# Patient Record
Sex: Male | Born: 1945
Health system: Southern US, Community
[De-identification: ages and names within clinical notes are randomized; demographics above are authoritative.]

## PROBLEM LIST (undated history)

## (undated) DIAGNOSIS — T4145XA Adverse effect of unspecified anesthetic, initial encounter: Secondary | ICD-10-CM

## (undated) DIAGNOSIS — T8859XA Other complications of anesthesia, initial encounter: Secondary | ICD-10-CM

## (undated) DIAGNOSIS — N529 Male erectile dysfunction, unspecified: Secondary | ICD-10-CM

## (undated) DIAGNOSIS — E7800 Pure hypercholesterolemia, unspecified: Secondary | ICD-10-CM

## (undated) DIAGNOSIS — Z87442 Personal history of urinary calculi: Secondary | ICD-10-CM

## (undated) DIAGNOSIS — K219 Gastro-esophageal reflux disease without esophagitis: Secondary | ICD-10-CM

## (undated) DIAGNOSIS — J309 Allergic rhinitis, unspecified: Secondary | ICD-10-CM

## (undated) DIAGNOSIS — I4510 Unspecified right bundle-branch block: Secondary | ICD-10-CM

## (undated) DIAGNOSIS — M1990 Unspecified osteoarthritis, unspecified site: Secondary | ICD-10-CM

## (undated) HISTORY — DX: Male erectile dysfunction, unspecified: N52.9

## (undated) HISTORY — DX: Allergic rhinitis, unspecified: J30.9

## (undated) HISTORY — DX: Personal history of urinary calculi: Z87.442

## (undated) HISTORY — DX: Pure hypercholesterolemia, unspecified: E78.00

---
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
## 1998-05-26 HISTORY — PX: KNEE ARTHROSCOPY: SUR90

## 2003-05-27 HISTORY — PX: OTHER SURGICAL HISTORY: SHX169

## 2006-05-26 HISTORY — PX: COLONOSCOPY: SHX174

## 2006-11-02 ENCOUNTER — Encounter: Admission: RE | Admit: 2006-11-02 | Discharge: 2006-11-02 | Payer: Self-pay | Admitting: Family Medicine

## 2009-04-16 ENCOUNTER — Ambulatory Visit: Payer: Self-pay | Admitting: Family Medicine

## 2009-04-16 DIAGNOSIS — J309 Allergic rhinitis, unspecified: Secondary | ICD-10-CM | POA: Insufficient documentation

## 2009-04-16 DIAGNOSIS — E7800 Pure hypercholesterolemia, unspecified: Secondary | ICD-10-CM

## 2009-04-16 DIAGNOSIS — Z87442 Personal history of urinary calculi: Secondary | ICD-10-CM

## 2009-05-30 ENCOUNTER — Ambulatory Visit: Payer: Self-pay | Admitting: Family Medicine

## 2009-05-30 DIAGNOSIS — R5381 Other malaise: Secondary | ICD-10-CM

## 2009-05-30 DIAGNOSIS — R5383 Other fatigue: Secondary | ICD-10-CM

## 2009-06-03 LAB — CONVERTED CEMR LAB
ALT: 26 units/L (ref 0–53)
AST: 34 units/L (ref 0–37)
Albumin: 4.1 g/dL (ref 3.5–5.2)
Alkaline Phosphatase: 69 units/L (ref 39–117)
BUN: 16 mg/dL (ref 6–23)
Basophils Absolute: 0 10*3/uL (ref 0.0–0.1)
Basophils Relative: 0.8 % (ref 0.0–3.0)
Bilirubin, Direct: 0.1 mg/dL (ref 0.0–0.3)
CO2: 27 meq/L (ref 19–32)
Calcium: 9.4 mg/dL (ref 8.4–10.5)
Chloride: 107 meq/L (ref 96–112)
Cholesterol: 178 mg/dL (ref 0–200)
Creatinine, Ser: 1 mg/dL (ref 0.4–1.5)
Eosinophils Absolute: 0.2 10*3/uL (ref 0.0–0.7)
Eosinophils Relative: 4.1 % (ref 0.0–5.0)
GFR calc non Af Amer: 79.78 mL/min (ref >60)
Glucose, Bld: 102 mg/dL — ABNORMAL HIGH (ref 70–99)
HCT: 43.8 % (ref 39.0–52.0)
HDL: 42.5 mg/dL (ref >39.00)
Hemoglobin: 14.4 g/dL (ref 13.0–17.0)
LDL Cholesterol: 110 mg/dL — ABNORMAL HIGH (ref 0–99)
Lymphocytes Relative: 29.1 % (ref 12.0–46.0)
Lymphs Abs: 1.7 10*3/uL (ref 0.7–4.0)
MCHC: 32.9 g/dL (ref 30.0–36.0)
MCV: 92.3 fL (ref 78.0–100.0)
Monocytes Absolute: 0.6 10*3/uL (ref 0.1–1.0)
Monocytes Relative: 10.8 % (ref 3.0–12.0)
Neutro Abs: 3.2 10*3/uL (ref 1.4–7.7)
Neutrophils Relative %: 55.2 % (ref 43.0–77.0)
PSA: 0.48 ng/mL (ref 0.10–4.00)
Platelets: 236 10*3/uL (ref 150.0–400.0)
Potassium: 4 meq/L (ref 3.5–5.1)
RBC: 4.74 M/uL (ref 4.22–5.81)
RDW: 12.5 % (ref 11.5–14.6)
Sodium: 141 meq/L (ref 135–145)
Total Bilirubin: 1.1 mg/dL (ref 0.3–1.2)
Total CHOL/HDL Ratio: 4
Total Protein: 7.5 g/dL (ref 6.0–8.3)
Triglycerides: 129 mg/dL (ref 0.0–149.0)
VLDL: 25.8 mg/dL (ref 0.0–40.0)
WBC: 5.7 10*3/uL (ref 4.5–10.5)

## 2009-06-06 ENCOUNTER — Telehealth: Payer: Self-pay | Admitting: Family Medicine

## 2009-06-06 ENCOUNTER — Ambulatory Visit: Payer: Self-pay | Admitting: Family Medicine

## 2009-07-24 ENCOUNTER — Telehealth: Payer: Self-pay | Admitting: Family Medicine

## 2009-07-24 ENCOUNTER — Ambulatory Visit: Payer: Self-pay | Admitting: Family Medicine

## 2010-02-21 ENCOUNTER — Ambulatory Visit: Payer: Self-pay | Admitting: Family Medicine

## 2010-02-21 DIAGNOSIS — R0609 Other forms of dyspnea: Secondary | ICD-10-CM | POA: Insufficient documentation

## 2010-02-21 DIAGNOSIS — R0989 Other specified symptoms and signs involving the circulatory and respiratory systems: Secondary | ICD-10-CM

## 2010-02-22 LAB — CONVERTED CEMR LAB
Albumin: 4.6 g/dL (ref 3.5–5.2)
BUN: 17 mg/dL (ref 6–23)
Basophils Absolute: 0 10*3/uL (ref 0.0–0.1)
Basophils Relative: 0.5 % (ref 0.0–3.0)
CO2: 28 meq/L (ref 19–32)
Calcium: 9.7 mg/dL (ref 8.4–10.5)
Chloride: 106 meq/L (ref 96–112)
Creatinine, Ser: 1.1 mg/dL (ref 0.4–1.5)
Eosinophils Absolute: 0.6 10*3/uL (ref 0.0–0.7)
Eosinophils Relative: 6.5 % — ABNORMAL HIGH (ref 0.0–5.0)
GFR calc non Af Amer: 71.31 mL/min (ref >60)
Glucose, Bld: 90 mg/dL (ref 70–99)
HCT: 43.9 % (ref 39.0–52.0)
Hemoglobin: 15.3 g/dL (ref 13.0–17.0)
Lymphocytes Relative: 21.7 % (ref 12.0–46.0)
Lymphs Abs: 1.9 10*3/uL (ref 0.7–4.0)
MCHC: 34.8 g/dL (ref 30.0–36.0)
MCV: 91.3 fL (ref 78.0–100.0)
Monocytes Absolute: 0.6 10*3/uL (ref 0.1–1.0)
Monocytes Relative: 6.5 % (ref 3.0–12.0)
Neutro Abs: 5.5 10*3/uL (ref 1.4–7.7)
Neutrophils Relative %: 64.8 % (ref 43.0–77.0)
Phosphorus: 4.3 mg/dL (ref 2.3–4.6)
Platelets: 278 10*3/uL (ref 150.0–400.0)
Potassium: 4.5 meq/L (ref 3.5–5.1)
RBC: 4.8 M/uL (ref 4.22–5.81)
RDW: 13.3 % (ref 11.5–14.6)
Sodium: 141 meq/L (ref 135–145)
TSH: 2.46 microintl units/mL (ref 0.35–5.50)
WBC: 8.5 10*3/uL (ref 4.5–10.5)

## 2010-02-23 HISTORY — PX: OTHER SURGICAL HISTORY: SHX169

## 2010-03-07 ENCOUNTER — Ambulatory Visit: Payer: Self-pay

## 2010-03-07 ENCOUNTER — Encounter: Payer: Self-pay | Admitting: Family Medicine

## 2010-03-13 ENCOUNTER — Encounter (INDEPENDENT_AMBULATORY_CARE_PROVIDER_SITE_OTHER): Payer: Self-pay | Admitting: *Deleted

## 2010-06-05 ENCOUNTER — Telehealth (INDEPENDENT_AMBULATORY_CARE_PROVIDER_SITE_OTHER): Payer: Self-pay | Admitting: *Deleted

## 2010-06-07 ENCOUNTER — Ambulatory Visit
Admission: RE | Admit: 2010-06-07 | Discharge: 2010-06-07 | Payer: Self-pay | Source: Home / Self Care | Attending: Family Medicine | Admitting: Family Medicine

## 2010-06-07 ENCOUNTER — Other Ambulatory Visit: Payer: Self-pay | Admitting: Family Medicine

## 2010-06-07 LAB — CBC WITH DIFFERENTIAL/PLATELET
Basophils Absolute: 0 10*3/uL (ref 0.0–0.1)
Basophils Relative: 0.3 % (ref 0.0–3.0)
Eosinophils Absolute: 0.3 10*3/uL (ref 0.0–0.7)
Eosinophils Relative: 3.5 % (ref 0.0–5.0)
HCT: 42.3 % (ref 39.0–52.0)
Hemoglobin: 14.4 g/dL (ref 13.0–17.0)
Lymphocytes Relative: 30.5 % (ref 12.0–46.0)
Lymphs Abs: 2.3 10*3/uL (ref 0.7–4.0)
MCHC: 34.1 g/dL (ref 30.0–36.0)
MCV: 90.3 fl (ref 78.0–100.0)
Monocytes Absolute: 0.7 10*3/uL (ref 0.1–1.0)
Monocytes Relative: 9.4 % (ref 3.0–12.0)
Neutro Abs: 4.2 10*3/uL (ref 1.4–7.7)
Neutrophils Relative %: 56.3 % (ref 43.0–77.0)
Platelets: 289 10*3/uL (ref 150.0–400.0)
RBC: 4.68 Mil/uL (ref 4.22–5.81)
RDW: 13.2 % (ref 11.5–14.6)
WBC: 7.4 10*3/uL (ref 4.5–10.5)

## 2010-06-07 LAB — HEPATIC FUNCTION PANEL
ALT: 24 U/L (ref 0–53)
AST: 24 U/L (ref 0–37)
Albumin: 4.2 g/dL (ref 3.5–5.2)
Alkaline Phosphatase: 79 U/L (ref 39–117)
Bilirubin, Direct: 0.2 mg/dL (ref 0.0–0.3)
Total Bilirubin: 0.9 mg/dL (ref 0.3–1.2)
Total Protein: 7.1 g/dL (ref 6.0–8.3)

## 2010-06-07 LAB — LIPID PANEL
Cholesterol: 155 mg/dL (ref 0–200)
HDL: 38.7 mg/dL — ABNORMAL LOW (ref >39.00)
LDL Cholesterol: 99 mg/dL (ref 0–99)
Total CHOL/HDL Ratio: 4
Triglycerides: 89 mg/dL (ref 0.0–149.0)
VLDL: 17.8 mg/dL (ref 0.0–40.0)

## 2010-06-07 LAB — PSA: PSA: 0.45 ng/mL (ref 0.10–4.00)

## 2010-06-12 ENCOUNTER — Ambulatory Visit
Admission: RE | Admit: 2010-06-12 | Discharge: 2010-06-12 | Payer: Self-pay | Source: Home / Self Care | Attending: Family Medicine | Admitting: Family Medicine

## 2010-06-17 ENCOUNTER — Telehealth (INDEPENDENT_AMBULATORY_CARE_PROVIDER_SITE_OTHER): Payer: Self-pay | Admitting: *Deleted

## 2010-06-27 NOTE — Progress Notes (Signed)
Summary: Rx. for Cipro  Phone Note Call from Patient Call back at Home Phone [phone]   Caller: Patient Call For: Hannah Beat MD Summary of Call: This patient came in today for a tetanus shot as prophylaxis before his mission trip.  He says that he was advised from the mission board to get a prescription from his MD for Cipro to take with him on his mission trip to [place].  He was told that it might be needed if he had a bout with diarrhea.  They will be taking their own food and cooks but he was told that this is a precautionary measure.  He just needs it before May.........no real hurry. Initial call taken by: Delilah Shan CMA Duncan Dull),  July 24, 2009 8:51 AM  Follow-up for Phone Call        That is reasonable for going to [place]. Follow-up by: Hannah Beat MD,  July 24, 2009 9:09 AM  Additional Follow-up for Phone Call Additional follow up Details #1::        Cipro 500 mg, 2 tabs by mouth x1. 1 refill call in  High dose, 2 tablets x 1 is standard dosing for traveler's diarrhea Additional Follow-up by: Hannah Beat MD,  July 24, 2009 9:12 AM    Additional Follow-up for Phone Call Additional follow up Details #2::    Left message on voicemail  to return call.  Follow-up by: Delilah Shan CMA Duncan Dull),  July 26, 2009 10:41 AM  Additional Follow-up for Phone Call Additional follow up Details #3:: Details for Additional Follow-up Action Taken: Rx called to [facility] pharmacy per patient's request, patient notified as instructed. Additional Follow-up by: Linde Gillis CMA Duncan Dull),  July 26, 2009 11:34 AM  New/Updated Medications: CIPRO 500 MG TABS (CIPROFLOXACIN HCL) take two tablets by mouth times one  Current Allergies (reviewed today): No known allergies

## 2010-06-27 NOTE — Miscellaneous (Signed)
Summary: [facility]  Vaccine Record   Imported By: Lanelle Bal 01/24/2010 14:03:21  _____________________________________________________________________  External Attachment:    Type:   Image     Comment:   External Document

## 2010-06-27 NOTE — Progress Notes (Signed)
Summary: Directions for Flonase  Phone Note From Pharmacy Call back at [phone]   Caller: [facility] Call For: Dr. Dallas Schimke  Summary of Call: Pharmacy has a new rx that was written today for Flonase, they need the directions. Initial call taken by: Sydell Axon LPN,  June 06, 2009 4:44 PM  Follow-up for Phone Call        2 sprays each nostril daily Follow-up by: Hannah Beat MD,  June 06, 2009 4:48 PM

## 2010-06-27 NOTE — Assessment & Plan Note (Signed)
Summary: [name]  Nurse Visit   Allergies: No Known Drug Allergies  Immunizations Administered:  Tetanus Vaccine:    Vaccine Type: Td    Site: right deltoid    Mfr: Sanofi Pasteur    Dose: 0.5 ml    Route: IM    Given by: Delilah Shan CMA (AAMA)    Exp. Date: 04/10/2011    Lot #: [id number]    VIS given: 04/13/07 version given July 24, 2009.  Orders Added: 1)  TD Toxoids IM 7 YR + [90714] 2)  Admin 1st Vaccine [45409]

## 2010-06-27 NOTE — Letter (Signed)
Summary: Records Dated 07-16-84 thru 12-06-08/[facility]  Records Dated 07-16-84 thru 12-06-08/[facility]   Imported By: Lanelle Bal 01/24/2010 14:20:39  _____________________________________________________________________  External Attachment:    Type:   Image     Comment:   External Document

## 2010-06-27 NOTE — Miscellaneous (Signed)
Summary: [facility]  Clinical Lists Changes  Observations: Added new observation of FLU VAX: Historical([facility]) (02/20/2010 14:05)      Immunization History:  Influenza Immunization History:    Influenza:  historical([facility]) (02/20/2010)

## 2010-06-27 NOTE — Assessment & Plan Note (Signed)
Summary: SHORTNESS OF BREATH   Vital Signs:  Patient profile:   65 year old male Weight:      223.75 pounds O2 Sat:      98 % on Room air Temp:     97.9 degrees F oral Pulse rate:   66 / minute Pulse rhythm:   regular BP sitting:   136 / 88  (left arm) Cuff size:   large  Vitals Entered By: Selena Batten Dance CMA Duncan Dull) (February 21, 2010 12:38 PM)  O2 Flow:  Room air CC: Shortness of breath on exhertion   History of Present Illness: CC: DOE  64yo with 4wk h/o DOE, noticing when walking from hole to hole in golf.  Also notices SOB with taking out trash or walking up 1 flight of stairs.  No leg swelling.  Slight orthopnea.  + PNDyspnea x 1 the other night "felt strangled".  Sleeps on 1 pillow.  Has gained 20 lbs in last 6 months.  No CP/tightness, dizziness.  + cough that comes up occasionally (tickle).  No HTN, DM.  Denies EtOH.  Has been walking/jogging 86mi/day at gym in AM, not SOB there.  No new medicines.  Takes alleve about once daily.  No smokers at home.  Has increased gym to two times a day but not noticing weight come down.  No fevers/chills, recent immobility, CA hx, fmhx blood clots.  Current Medications (verified): 1)  Simvastatin 40 Mg Tabs (Simvastatin) .... 1/2 A Tab By Mouth Daily 2)  Osteo Bi-Flex Regular Strength 250-200 Mg Tabs (Glucosamine-Chondroitin) .... One Daily 3)  Aleve 220 Mg Tabs (Naproxen Sodium) .... One Daily 4)  Fish Oil 500 Mg Caps (Omega-3 Fatty Acids) .... 1000mg  Daily 5)  Flonase 50 Mcg/act Susp (Fluticasone Propionate) 6)  Viagra 100 Mg Tabs (Sildenafil Citrate) .Marland Kitchen.. 1 By Mouth Prior To Intercourse  Allergies (verified): No Known Drug Allergies  Past History:  Past Medical History: Last updated: 04/16/2009 NEPHROLITHIASIS, HX OF HYPERCHOLESTEROLEMIA  ED h/o dipping Allergic rhinitis  Social History: Last updated: 04/16/2009 Occupation: Nutritional therapist Married Never Smoked Alcohol use-no Drug use-no Regular exercise-yes (runs, 3 miles,  several times a week)  Review of Systems       per HPI  Physical Exam  General:  Well-developed,well-nourished,in no acute distress; alert,appropriate and cooperative throughout examination Mouth:  Oral mucosa and oropharynx without lesions or exudates.  Teeth in good repair. Neck:  No deformities, masses, or tenderness noted.  no bruits Lungs:  Normal respiratory effort, chest expands symmetrically. Lungs are clear to auscultation, no crackles or wheezes. Heart:  Normal rate and regular rhythm. S1 and S2 normal without gallop, murmur, click, rub or other extra sounds. Abdomen:  Bowel sounds positive,abdomen soft and non-tender without masses, organomegaly or hernias noted.  + ventral hernia Pulses:  2+ rad pulses Extremities:  mild nonpitting edema   Impression & Recommendations:  Problem # 1:  DYSPNEA ON EXERTION (ICD-786.09) concern for cardiac vs pulm.  check CXR today - nothing acute.  discussed with patient.  no risk factors for PE.  Check blood work, set up for ultrasound, rec stay away from salt, f/u with PCP.  No lasix today as no edema on exam (pulm or legs).  BP stable at 130/80s.  Orders: T-2 View CXR (71020TC) TLB-Renal Function Panel (80069-RENAL) TLB-TSH (Thyroid Stimulating Hormone) (84443-TSH)  Complete Medication List: 1)  Simvastatin 40 Mg Tabs (Simvastatin) .... 1/2 a tab by mouth daily 2)  Osteo Bi-flex Regular Strength 250-200 Mg Tabs (Glucosamine-chondroitin) .... One  daily 3)  Aleve 220 Mg Tabs (Naproxen sodium) .... One daily 4)  Fish Oil 500 Mg Caps (Omega-3 fatty acids) .... 1000mg  daily 5)  Flonase 50 Mcg/act Susp (Fluticasone propionate) 6)  Viagra 100 Mg Tabs (Sildenafil citrate) .Marland Kitchen.. 1 by mouth prior to intercourse  Patient Instructions: 1)  This could be coming from heart so we will check blood work today, Xray today, and we will set you up for echo of heart. 2)  Cut back on salt (1500mg  Na/day).  more water.  try to limit alleve, try tylenol for  joint pains. 3)  Good to see you today, call clinic with questions.  Follow up with Dr. Patsy Lager in next few weeks.  Current Allergies (reviewed today): No known allergies   Appended Document: SHORTNESS OF BREATH    Clinical Lists Changes  Orders: Added new Referral order of Radiology Referral (Radiology) - Signed      Appended Document: SHORTNESS OF BREATH    Clinical Lists Changes  Orders: Added new Test order of TLB-CBC Platelet - w/Differential (85025-CBCD) - Signed

## 2010-06-27 NOTE — Progress Notes (Signed)
Summary: viagra   Phone Note Refill Request Message from:  Fax from Pharmacy on June 17, 2010 11:14 AM  Refills Requested: Medication #1:  VIAGRA 100 MG TABS 1 by mouth prior to intercourse.   Last Refilled: 05/16/2010 Refill request from [facility]. [phone].   Initial call taken by: Melody Comas,  June 17, 2010 11:14 AM  Follow-up for Phone Call        i gave 11 refills on last script. please call them and correct. Follow-up by: Hannah Beat MD,  June 17, 2010 12:03 PM  Additional Follow-up for Phone Call Additional follow up Details #1::        pharmacy advised.Consuello Masse CMA   Additional Follow-up by: Benny Lennert CMA Duncan Dull),  June 17, 2010 12:20 PM

## 2010-06-27 NOTE — Assessment & Plan Note (Signed)
Summary: CPX   Vital Signs:  Patient profile:   65 year old male Height:      68 inches Weight:      218.4 pounds BMI:     33.33 Temp:     97.7 degrees F oral Pulse rate:   78 / minute Pulse rhythm:   regular BP sitting:   120 / 78  (left arm) Cuff size:   large  Vitals Entered By: Benny Lennert CMA Duncan Dull) (June 06, 2009 2:41 PM)   History of Present Illness: Chief complaint cpx   Some intermittent ED: the patient is able to achieve  ejaculation from intercourse  the vast majority of time, however his  call the direction is worse than it used to be. Sometimes he does lose his erection in the middle of intercourse.  ttravel medicine: Travelling to [place], needs immunizations and malaria prophylaxis  Had a colnonopscopy in 2008 - 10 years  Preventive Screening-Counseling & Management  Alcohol-Tobacco     Alcohol drinks/day: 0     Alcohol Counseling: not indicated; use of alcohol is not excessive or problematic     Smoking Status: never     Tobacco Counseling: not indicated; no tobacco use  Caffeine-Diet-Exercise     Does Patient Exercise: yes     Type of exercise: cardio weights     Times/week: 4     Exercise Counseling: not indicated; exercise is adequate  Hep-HIV-STD-Contraception     STD Risk Counseling: not indicated-no STD risk noted     Testicular SE Education/Counseling to perform regular STE      Sexual History:  currently monogamous.        Drug Use:  never.    Allergies (verified): No Known Drug Allergies  Past History:  Past medical, surgical, family and social histories (including risk factors) reviewed, and no changes noted (except as noted below).  Past Medical History: Reviewed history from 04/16/2009 and no changes required. NEPHROLITHIASIS, HX OF HYPERCHOLESTEROLEMIA  ED h/o dipping Allergic rhinitis  Past Surgical History: Reviewed history from 04/16/2009 and no changes required. 1954 tonsils and adenoids right knee 2000,  arthroscopy left thumb joint replace 2005, GRAMIG  Family History: Reviewed history from 04/16/2009 and no changes required. Family History Lung cancer Family History of Stroke F 1st degree relative <60 Family History of Stroke M 1st degree relative <50 family history of brain cancer  Social History: Reviewed history from 04/16/2009 and no changes required. Occupation: Nutritional therapist Married Never Smoked Alcohol use-no Drug use-no Regular exercise-yes (runs, 3 miles, several times a week) Sexual History:  currently monogamous Drug Use:  never   Impression & Recommendations:  Problem # 1:  Preventive Health Care (ICD-V70.0) Assessment New The patient's preventative maintenance and recommended screening tests for an annual wellness exam were reviewed in full today.  Counselled on the importance of diet, exercise, and its role in overall health and mortality. The patient's FH and SH was reviewed, including their home life, tobacco status, and drug and alcohol status.   Complete Medication List: 1)  Simvastatin 40 Mg Tabs (Simvastatin) .... 1/2 a tab by mouth daily 2)  Osteo Bi-flex Regular Strength 250-200 Mg Tabs (Glucosamine-chondroitin) .... One daily 3)  Aleve 220 Mg Tabs (Naproxen sodium) .... One daily 4)  Fish Oil 500 Mg Caps (Omega-3 fatty acids) .... 1000mg  daily 5)  Flonase 50 Mcg/act Susp (Fluticasone propionate) 6)  Viagra 100 Mg Tabs (Sildenafil citrate) .Marland Kitchen.. 1 by mouth prior to intercourse 7)  Chloroquine Phosphate 500 Mg  Tabs (Chloroquine phosphate) .Marland Kitchen.. 1 by mouth q week 2 weeks before travel, during, and 4 weeks after Prescriptions: CHLOROQUINE PHOSPHATE 500 MG TABS (CHLOROQUINE PHOSPHATE) 1 by mouth q week 2 weeks before travel, during, and 4 weeks after  #7 x 2   Entered and Authorized by:   Hannah Beat MD   Signed by:   Hannah Beat MD on 06/06/2009   Method used:   Print then Give to Patient   RxID:   [id number] CHLOROQUINE PHOSPHATE 500 MG TABS  (CHLOROQUINE PHOSPHATE) 1 by mouth q week 2 weeks before travel, during, and 4 weeks after  #7 x 0   Entered and Authorized by:   Hannah Beat MD   Signed by:   Hannah Beat MD on 06/06/2009   Method used:   Print then Give to Patient   RxID:   [id number] VIAGRA 100 MG TABS (SILDENAFIL CITRATE) 1 by mouth prior to intercourse  #10 x 11   Entered and Authorized by:   Hannah Beat MD   Signed by:   Hannah Beat MD on 06/06/2009   Method used:   Print then Give to Patient   RxID:   [id number] FLONASE 50 MCG/ACT SUSP (FLUTICASONE PROPIONATE)   #1 x 11   Entered and Authorized by:   Hannah Beat MD   Signed by:   Hannah Beat MD on 06/06/2009   Method used:   Print then Give to Patient   RxID:   [id number] SIMVASTATIN 40 MG TABS (SIMVASTATIN) 1/2 a tab by mouth daily  #30 x 11   Entered and Authorized by:   Hannah Beat MD   Signed by:   Hannah Beat MD on 06/06/2009   Method used:   Print then Give to Patient   RxID:   [id number]   Current Allergies (reviewed today): No known allergies  Colonoscopy Result Date:  05/26/2006 Colonoscopy Result:  normal Colonoscopy Next Due:  10 yr   Review of  Systems General: denies fatigue, malaise, fever, weight loss Eyes: denies blurring, diplopia, irritation, discharge Ear/Nose/Throat: denies ear pain or discharge, nasal obstruction or discharge, sore throat Cardiovascular: denies chest pain, palpitations, paroxysmal nocturnal dyspnea, orthopnea, edema Respiratory: denies coughing, wheezing, dyspnea, hemoptysis Gastrointestinal: denies abdominal pain, dysphagia, nausea, vomiting, diarrhea, constipation Genitourinary: erectile difficulties as noted above Musculoskeletal:the patient does complain of some intermittent knee pain Skin: denies rashes, itching, lumps, sores, lesions, color change Neurologic: denies syncope, seizures, transient paralysis, weakness, paresthesias Psychiatric: denies  depression, anxiety, mental disturbance, difficulty sleeping, suicidal ideation, hallucinations, paranoia Endocrine: denies polyuria, polydipsia, polyphagia, weight change, heat or cold intolerance Heme/Lymphatic: denies easy or excessive bruising, history of blood transfusions, anemia, bleeding disorders, adenopathy, chills, sweats Allergic/Immunologic: denies urticaria, hay fever, frequent UTIs; denies HIV high risk behaviors   Physical Exam General Appearance: well developed, well nourished, no acute distress Eyes: conjunctiva and lids normal, PERRLA, EOMI, fundi WNL Ears, Nose, Mouth, Throat: TM clear, nares clear, oral exam WNL Neck: supple, no lymphadenopathy, no thyromegaly, no JVD Respiratory: clear to auscultation and percussion, respiratory effort normal Cardiovascular: regular rate and rhythm, S1-S2, no murmur, rub or gallop, no bruits, peripheral pulses normal and symmetric, no cyanosis, clubbing, edema or varicosities Chest: no scars, masses, tenderness; no asymmetry, skin changes, nipple discharge, no gynecomastia   Gastrointestinal: soft, non-tender; no hepatosplenomegaly, masses; active bowel sounds all quadrants, guaiac negative stool; no masses, tenderness, hemorrhoids  Genitourinary: no hernia, testicular mass, penile discharge, priapism or prostate enlargement Lymphatic: no cervical, axillary or inguinal adenopathy Musculoskeletal: gait  normal, muscle tone and strength WNL, no joint swelling, effusions, discoloration, crepitus  Skin: clear, good turgor, color WNL, no rashes, lesions, or ulcerations Neurologic: normal mental status, normal reflexes, normal strength, sensation, and motion Psychiatric: alert; oriented to person, place and time Other Exam:        Colorectal Screening   Hemoccult: Not documented    Colonoscopy: normal  (05/26/2006)   Colonoscopy due: 05/26/2016  Other Screening   PSA: 0.48  (05/30/2009)   PSA due due: 05/30/2010   Smoking status:  never  (06/06/2009)  Lipids   Total Cholesterol: 178  (05/30/2009)   LDL: 110  (05/30/2009)   LDL Direct: Not documented   HDL: 42.50  (05/30/2009)   Triglycerides: 129.0  (05/30/2009)    SGOT (AST): 34  (05/30/2009)   SGPT (ALT): 26  (05/30/2009)   Alkaline phosphatase: 69  (05/30/2009)   Total bilirubin: 1.1  (05/30/2009)    Lipid flowsheet reviewed?: Yes   Progress toward LDL goal: At goal

## 2010-06-27 NOTE — Assessment & Plan Note (Signed)
Summary: CPX/CLE   Vital Signs:  Patient profile:   65 year old male Height:      68 inches Weight:      219.50 pounds BMI:     33.50 Temp:     98.1 degrees F oral Pulse rate:   66 / minute Pulse rhythm:   regular BP sitting:   110 / 70  (left arm) Cuff size:   large  Vitals Entered By: Benny Lennert CMA Duncan Dull) (June 12, 2010 8:33 AM)  History of Present Illness: Chief complaint cpx  65 year old male:  colon, 2008  Had some trouble breathing, and he saw Dr. Reece Agar.  at this point, this has mostly gotten better echo normal, grade 1 diastolic disease, normal EF normal CXR Now doing much better, some occ coughing  Had to use some bathroom at night, when lying down could not really get enough  Having to bend up, and bend over, 2-3 miles.  Able to run. No CP or SOB with running.  Also has some occ knee pains  Preventive Screening-Counseling & Management  Alcohol-Tobacco     Alcohol drinks/day: 0     Alcohol Counseling: not indicated; use of alcohol is not excessive or problematic     Smoking Status: never     Tobacco Counseling: not indicated; no tobacco use  Caffeine-Diet-Exercise     Does Patient Exercise: yes     Type of exercise: cardio weights     Times/week: 4     Exercise Counseling: not indicated; exercise is adequate  Hep-HIV-STD-Contraception     STD Risk Counseling: not indicated-no STD risk noted     Testicular SE Education/Counseling to perform regular STE      Sexual History:  currently monogamous.        Drug Use:  never.    Clinical Review Panels:  Prevention   Last Colonoscopy:  normal (05/26/2006)   Last PSA:  0.45 (06/07/2010)  Immunizations   Last Tetanus Booster:  Td (07/24/2009)   Last Flu Vaccine:  Historical([facility]) (02/20/2010)   Last Pneumovax:  Pneumovax (Medicare) (06/12/2010)  Lipid Management   Cholesterol:  155 (06/07/2010)   LDL (bad choesterol):  99 (06/07/2010)   HDL (good cholesterol):  38.70  (06/07/2010)  Diabetes Management   Creatinine:  1.1 (02/21/2010)   Last Flu Vaccine:  Historical([facility]) (02/20/2010)   Last Pneumovax:  Pneumovax (Medicare) (06/12/2010)  CBC   WBC:  7.4 (06/07/2010)   RBC:  4.68 (06/07/2010)   Hgb:  14.4 (06/07/2010)   Hct:  42.3 (06/07/2010)   Platelets:  289.0 (06/07/2010)   MCV  90.3 (06/07/2010)   MCHC  34.1 (06/07/2010)   RDW  13.2 (06/07/2010)   PMN:  56.3 (06/07/2010)   Lymphs:  30.5 (06/07/2010)   Monos:  9.4 (06/07/2010)   Eosinophils:  3.5 (06/07/2010)   Basophil:  0.3 (06/07/2010)  Complete Metabolic Panel   Glucose:  90 (02/21/2010)   Sodium:  141 (02/21/2010)   Potassium:  4.5 (02/21/2010)   Chloride:  106 (02/21/2010)   CO2:  28 (02/21/2010)   BUN:  17 (02/21/2010)   Creatinine:  1.1 (02/21/2010)   Albumin:  4.2 (06/07/2010)   Total Protein:  7.1 (06/07/2010)   Calcium:  9.7 (02/21/2010)   Total Bili:  0.9 (06/07/2010)   Alk Phos:  79 (06/07/2010)   SGPT (ALT):  24 (06/07/2010)   SGOT (AST):  24 (06/07/2010)   Allergies (verified): No Known Drug Allergies  Past History:  Past medical, surgical, family and  social histories (including risk factors) reviewed, and no changes noted (except as noted below).  Past Medical History: Reviewed history from 04/16/2009 and no changes required. NEPHROLITHIASIS, HX OF HYPERCHOLESTEROLEMIA  ED h/o dipping Allergic rhinitis  Past Surgical History: Reviewed history from 03/11/2010 and no changes required. 1954 tonsils and adenoids right knee 2000, arthroscopy left thumb joint replace 2005, GRAMIG  2D echo - mild diastolic dysfunction, nl EF 02/2010   Family History: Reviewed history from 04/16/2009 and no changes required. Family History Lung cancer Family History of Stroke F 1st degree relative <60 Family History of Stroke M 1st degree relative <50 family history of brain cancer  Social History: Reviewed history from 04/16/2009 and no changes  required. Occupation: Nutritional therapist Married Never Smoked Alcohol use-no Drug use-no Regular exercise-yes (runs, 3 miles, several times a week)  Review of Systems  General: Denies fever, chills, sweats, anorexia, fatigue, weakness, malaise Eyes: Denies blurring, vision loss ENT: Denies earache, nasal congestion, nosebleeds, sore throat, and hoarseness.  Cardiovascular / Resp: as above GI: Denies nausea, vomiting, diarrhea, constipation, change in bowel habits, abdominal pain, melena, hematochezia GU: Denies dysuria, hematuria, discharge, urinary frequency, urinary hesitancy, nocturia, incontinence, genital sores, decreased libido Musculoskeletal: as above Derm: Denies rash, itching Neuro: Denies  paresthesias, frequent falls, frequent headaches, and difficulty walking.  Psych: Denies depression, anxiety Endocrine: Denies cold intolerance, heat intolerance, polydipsia, polyphagia, polyuria, and unusual weight change.  Heme: Denies enlarged lymph nodes Allergy: No hayfever   Otherwise, the pertinent positives and negatives are listed above and in the HPI, otherwise a full review of systems has been reviewed and is negative unless noted positive.    Impression & Recommendations:  Problem # 1:  HEALTH MAINTENANCE EXAM (ICD-V70.0) The patient's preventative maintenance and recommended screening tests for an annual wellness exam were reviewed in full today. Brought up to date unless services declined.  Counselled on the importance of diet, exercise, and its role in overall health and mortality. The patient's FH and SH was reviewed, including their home life, tobacco status, and drug and alcohol status.   Complete Medication List: 1)  Simvastatin 40 Mg Tabs (Simvastatin) .... 1/2 a tab by mouth daily 2)  Osteo Bi-flex Regular Strength 250-200 Mg Tabs (Glucosamine-chondroitin) .... One daily 3)  Aleve 220 Mg Tabs (Naproxen sodium) .... One daily 4)  Fish Oil 500 Mg Caps (Omega-3 fatty acids)  .... 1000mg  daily 5)  Fluticasone Propionate 50 Mcg/act Susp (Fluticasone propionate) .... 2 sprays each nostril once daily 6)  Viagra 100 Mg Tabs (Sildenafil citrate) .Marland Kitchen.. 1 by mouth prior to intercourse  Other Orders: Pneumococcal Vaccine (96045) Admin 1st Vaccine (40981)  Patient Instructions: 1)  GENERIC OMEPRAZOLE EVERY MORNING, 30 MINUTES BEFORE BREAKFAST  Prescriptions: FLUTICASONE PROPIONATE 50 MCG/ACT  SUSP (FLUTICASONE PROPIONATE) 2 sprays each nostril once daily  #1 vial x 11   Entered and Authorized by:   Hannah Beat MD   Signed by:   Hannah Beat MD on 06/12/2010   Method used:   Electronically to        [facility]* (retail)       [address]       [place], [place]  [address]       Ph: [phone]       Fax: [phone]   RxID:   [id number] VIAGRA 100 MG TABS (SILDENAFIL CITRATE) 1 by mouth prior to intercourse  #10 x 11   Entered and Authorized by:   Hannah Beat MD   Signed by:  Hannah Beat MD on 06/12/2010   Method used:   Electronically to        [facility]* (retail)       [address]       [place], [place]  [address]       Ph: [phone]       Fax: [phone]   RxID:   [id number] SIMVASTATIN 40 MG TABS (SIMVASTATIN) 1/2 a tab by mouth daily  #30 x 11   Entered and Authorized by:   Hannah Beat MD   Signed by:   Hannah Beat MD on 06/12/2010   Method used:   Electronically to        [facility]* (retail)       [address]       [place], [place]  [address]       Ph: [phone]       Fax: [phone]   RxID:   [id number]    Orders Added: 1)  Pneumococcal Vaccine [90732] 2)  Admin 1st Vaccine [90471] 3)  Est. Patient 65& > [01093]   Immunizations Administered:  Pneumonia Vaccine:    Vaccine Type: Pneumovax (Medicare)    Site: left deltoid    Mfr: Merck    Dose: 0.5 ml    Route: IM    Given by: Benny Lennert CMA (AAMA)    Exp. Date: 10/18/2011    Lot #: [id number]    VIS given: 04/30/09  version given June 12, 2010.   Immunizations Administered:  Pneumonia Vaccine:    Vaccine Type: Pneumovax (Medicare)    Site: left deltoid    Mfr: Merck    Dose: 0.5 ml    Route: IM    Given by: Benny Lennert CMA (AAMA)    Exp. Date: 10/18/2011    Lot #: [id number]    VIS given: 04/30/09 version given June 12, 2010.  Prevention & Chronic Care Immunizations   Influenza vaccine: Historical([facility])  (02/20/2010)    Tetanus booster: 07/24/2009: Td    Pneumococcal vaccine: Pneumovax (Medicare)  (06/12/2010)    H. zoster vaccine: Not documented   H. zoster vaccine deferral: Deferred  (06/12/2010)  Colorectal Screening   Hemoccult: Not documented    Colonoscopy: normal  (05/26/2006)   Colonoscopy due: 05/26/2016  Other Screening   PSA: 0.45  (06/07/2010)   PSA due due: 05/30/2010   Smoking status: never  (06/12/2010)  Lipids   Total Cholesterol: 155  (06/07/2010)   LDL: 99  (06/07/2010)   LDL Direct: Not documented   HDL: 38.70  (06/07/2010)   Triglycerides: 89.0  (06/07/2010)    SGOT (AST): 24  (06/07/2010)   SGPT (ALT): 24  (06/07/2010)   Alkaline phosphatase: 79  (06/07/2010)   Total bilirubin: 0.9  (06/07/2010)  Self-Management Support :    Lipid self-management support: Not documented    Nursing Instructions: Give Pneumovax today    Current Allergies (reviewed today): No known allergies  Physical Exam General Appearance: well developed, well nourished, no acute distress Eyes: conjunctiva and lids normal, PERRLA, EOMI Ears, Nose, Mouth, Throat: TM clear, nares clear, oral exam WNL Neck: supple, no lymphadenopathy, no thyromegaly, no JVD Respiratory: clear to auscultation and percussion, respiratory effort normal Cardiovascular: regular rate and rhythm, S1-S2, no murmur, rub or gallop, no bruits, peripheral pulses normal and symmetric, no cyanosis, clubbing, edema or varicosities Chest: no scars, masses, tenderness; no asymmetry, skin changes,  nipple discharge, no gynecomastia   Gastrointestinal: soft, non-tender; no hepatosplenomegaly, masses; active bowel sounds all quadrants, no masses,  tenderness, hemorrhoids  Genitourinary: no hernia, testicular mass, penile discharge, priapism or prostate enlargement Lymphatic: no cervical, axillary or inguinal adenopathy Musculoskeletal: gait normal, muscle tone and strength WNL, no joint swelling, effusions, discoloration, crepitus  Skin: clear, good turgor, color WNL, no rashes, lesions, or ulcerations Neurologic: normal mental status, normal reflexes, normal strength, sensation, and motion Psychiatric: alert; oriented to person, place and time Other Exam:

## 2010-06-27 NOTE — Progress Notes (Signed)
----   Converted from flag ---- ---- 06/03/2010 12:39 PM, Hannah Beat MD wrote: Prephysical Labs, several days before, fasting HFP, FLP, CBC with diff, PSA: 272.4, v77.1, ,780.79, v76.44   ---- 06/03/2010 11:46 AM, Liane Comber CMA (AAMA) wrote: Lab orders please! Good Morning! This pt is scheduled for cpx labs Friday, which labs to draw and dx codes to use? Thanks Tasha ------------------------------

## 2010-12-25 HISTORY — PX: TOTAL KNEE ARTHROPLASTY: SHX125

## 2011-01-10 ENCOUNTER — Other Ambulatory Visit: Payer: Self-pay | Admitting: Orthopedic Surgery

## 2011-01-10 ENCOUNTER — Encounter (HOSPITAL_COMMUNITY): Payer: 59

## 2011-01-10 LAB — URINALYSIS, ROUTINE W REFLEX MICROSCOPIC
Specific Gravity, Urine: 1.016 (ref 1.005–1.030)
Urobilinogen, UA: 0.2 mg/dL (ref 0.0–1.0)
pH: 6.5 (ref 5.0–8.0)

## 2011-01-10 LAB — CBC
HCT: 43 % (ref 39.0–52.0)
Hemoglobin: 14.6 g/dL (ref 13.0–17.0)
RBC: 4.88 MIL/uL (ref 4.22–5.81)
RDW: 12.6 % (ref 11.5–15.5)
WBC: 6.9 10*3/uL (ref 4.0–10.5)

## 2011-01-10 LAB — SURGICAL PCR SCREEN
MRSA, PCR: NEGATIVE
Staphylococcus aureus: NEGATIVE

## 2011-01-10 LAB — COMPREHENSIVE METABOLIC PANEL
BUN: 17 mg/dL (ref 6–23)
CO2: 26 mEq/L (ref 19–32)
Chloride: 104 mEq/L (ref 96–112)
Creatinine, Ser: 0.89 mg/dL (ref 0.50–1.35)
GFR calc non Af Amer: >60 mL/min (ref >60)
Total Bilirubin: 0.4 mg/dL (ref 0.3–1.2)

## 2011-01-10 LAB — URINE MICROSCOPIC-ADD ON

## 2011-01-10 LAB — PROTIME-INR: INR: 0.96 (ref 0.00–1.49)

## 2011-01-20 ENCOUNTER — Inpatient Hospital Stay (HOSPITAL_COMMUNITY)
Admission: RE | Admit: 2011-01-20 | Discharge: 2011-01-23 | DRG: 470 | Disposition: A | Discharge status: Home Health | Payer: 59 | Source: Ambulatory Visit | Attending: Orthopedic Surgery | Admitting: Orthopedic Surgery

## 2011-01-20 DIAGNOSIS — E7800 Pure hypercholesterolemia, unspecified: Secondary | ICD-10-CM | POA: Diagnosis present

## 2011-01-20 DIAGNOSIS — M1710 Unilateral primary osteoarthritis, unspecified knee: Principal | ICD-10-CM | POA: Diagnosis present

## 2011-01-20 DIAGNOSIS — Z79899 Other long term (current) drug therapy: Secondary | ICD-10-CM

## 2011-01-20 DIAGNOSIS — Z791 Long term (current) use of non-steroidal anti-inflammatories (NSAID): Secondary | ICD-10-CM

## 2011-01-20 DIAGNOSIS — D62 Acute posthemorrhagic anemia: Secondary | ICD-10-CM | POA: Diagnosis not present

## 2011-01-20 DIAGNOSIS — Z01812 Encounter for preprocedural laboratory examination: Secondary | ICD-10-CM

## 2011-01-21 LAB — CBC
HCT: 36.1 % — ABNORMAL LOW (ref 39.0–52.0)
MCHC: 34.1 g/dL (ref 30.0–36.0)
MCV: 89.1 fL (ref 78.0–100.0)
RDW: 13 % (ref 11.5–15.5)

## 2011-01-21 LAB — BASIC METABOLIC PANEL
BUN: 13 mg/dL (ref 6–23)
Chloride: 100 mEq/L (ref 96–112)
Creatinine, Ser: 0.96 mg/dL (ref 0.50–1.35)
GFR calc Af Amer: >60 mL/min (ref >60)
GFR calc non Af Amer: >60 mL/min (ref >60)

## 2011-01-22 LAB — BASIC METABOLIC PANEL
BUN: 10 mg/dL (ref 6–23)
Calcium: 8.7 mg/dL (ref 8.4–10.5)
GFR calc Af Amer: >60 mL/min (ref >60)
GFR calc non Af Amer: >60 mL/min (ref >60)
Potassium: 3.9 mEq/L (ref 3.5–5.1)

## 2011-01-22 LAB — CBC
HCT: 32.5 % — ABNORMAL LOW (ref 39.0–52.0)
MCHC: 33.5 g/dL (ref 30.0–36.0)
RDW: 12.8 % (ref 11.5–15.5)

## 2011-01-23 LAB — CBC
HCT: 29 % — ABNORMAL LOW (ref 39.0–52.0)
MCH: 30.4 pg (ref 26.0–34.0)
MCV: 89 fL (ref 78.0–100.0)
RBC: 3.26 MIL/uL — ABNORMAL LOW (ref 4.22–5.81)

## 2011-01-23 NOTE — Op Note (Signed)
NAME:  [name], [name] NO.:  [id number]  MEDICAL RECORD NO.:  [id number]  LOCATION:  1618                         FACILITY:  [facility]  PHYSICIAN:  Ollen Gross, M.D.    DATE OF BIRTH:  16-Apr-1946  DATE OF PROCEDURE:  01/20/2011 DATE OF DISCHARGE:                              OPERATIVE REPORT   PREOPERATIVE DIAGNOSIS:  Osteoarthritis, right knee.  POSTOPERATIVE DIAGNOSIS:  Osteoarthritis, right knee.  PROCEDURE:  Right total knee arthroplasty.  SURGEON:  Ollen Gross, MD  ASSISTANT:  Alexzandrew L. Julien Girt, PA-C  ANESTHESIA:  General.  ESTIMATED BLOOD LOSS:  Minimal.  DRAIN:  Hemovac x1.  TOURNIQUET TIME:  43 minutes at 300 mmHg.  COMPLICATIONS:  None.  CONDITION:  Stable to Recovery.  BRIEF CLINICAL NOTE:  [name] is a 65 year old male with advanced end-stage arthritis of the right knee, bone on bone in the medial and patellofemoral compartments.  These had extensive nonoperative management, including injections and has had persistently worsening pain and dysfunction.  He has no contraindications to knee replacement.  He presents now for right total knee arthroplasty.  PROCEDURE IN DETAIL:  After successful administration of general anesthetic, a tourniquet was placed high on his right thigh, and his right lower extremity was prepped and draped in usual sterile fashion. Extremity was wrapped in an Esmarch, knee flexed, tourniquet inflated to 300 mmHg.  Midline incision was made with a 10 blade through subcutaneous tissue to the level of the extensor mechanism.  A fresh blade was to make a medial parapatellar arthrotomy.  Soft tissue over the proximal medial tibia was subperiosteally elevated to the joint line with the knife into the semimembranosus bursa with a Cobb elevator. Soft tissue laterally was elevated with attention being paid to avoid any patellar tendon on tibial tubercle.  The patella was everted, knee flexed 90 degrees, and ACL  and PCL removed.  Drill was used to create a starting hole in the distal femur and the canal was thoroughly irrigated.  A 5-degree right valgus alignment guide was placed.  Distal femoral cutting block was pinned to remove 11 mm off the distal femur. Distal femoral resection was made with an oscillating saw.  The tibia subluxed forward and the menisci were removed.  Extramedullary tibial alignment guide was placed referencing proximally at the medial aspect of the tibial tubercle and distally along the 2nd metatarsal axis and tibial crest.  The block was pinned to remove 2 mm off the more deficient medial side.  Tibial resection was made with an oscillating saw.  Size 4 was most the appropriate tibial component and the proximal tibia was prepared with a modular drill and keel punched for the size 4.  Femoral sizing guide was placed, size 5 was most appropriate on the femur.  Rotation was marked off the epicondylar axis and confirmed by creating rectangular flexion gap at 90 degrees.  Cutting block was pinned in this rotation and the anterior and posterior chamfer cuts were made.  Intercondylar block was placed and that cut was made.  Trial size 5 posterior stabilized femur was placed.  A 12.5 mm posterior stabilized rotating platform insert trial was placed.  With the 12.5, full extension was  achieved with excellent varus-valgus and anterior- posterior balance, throughout full range of motion.  Patella was everted and thickness measured to be 26 mm.  Freehand resection was taken to 14 mm, 41 template was placed, lug holes were drilled, trial patella was placed, and it tracked normally.  Osteophytes removed from the posterior femur with trial in place.  All trials were removed and the cut bone surfaces were prepared with pulsatile lavage.  Cement was mixed and once ready for implantation, a size 4 mobile-bearing tibial tray, size 5 posterior stabilized femur, and 41 patella were cemented  into place and the patella was held with a clamp.  Trial 12.5 mm insert was placed, knee held in full extension, and all extruded cement removed.  When the cement was fully hardened, then the permanent 12.5 mm posterior stabilized rotating platform insert was placed in tibial tray.  The wound was copiously irrigated with saline solution and the arthrotomy closed over Hemovac drain with interrupted #1 PDS.  Flexion against gravity was 135 degrees and the patella tracked normally.  Tourniquet was released for a total time of 43 minutes.  Subcutaneous was closed with interrupted 2-0 Vicryl and subcuticular with running 4-0 Monocryl. Catheter for Marcaine pain pump was placed and the pump was initiated. Incision cleaned and dried and Steri-Strips and a bulky sterile dressing applied.  He was then placed into a knee immobilizer, awakened, and transported to Recovery in stable condition.     Ollen Gross, M.D.     FA/MEDQ  D:  01/20/2011  T:  01/21/2011  Job:  [id number]  Electronically Signed by Ollen Gross M.D. on 01/23/2011 04:06:29 PM

## 2011-01-23 NOTE — H&P (Signed)
NAME:  [name], TILLERY NO.:  [id number]  MEDICAL RECORD NO.:  [id number]  LOCATION:                                 FACILITY:  PHYSICIAN:  Ollen Gross, M.D.    DATE OF BIRTH:  1946/05/04  DATE OF ADMISSION:  01/20/2011 DATE OF DISCHARGE:                             HISTORY & PHYSICAL   CHIEF COMPLAINT:  Right knee pain.  HISTORY OF PRESENT ILLNESS:  The patient is a 65 year old male who has been seen by Dr. Lequita Halt for ongoing knee pain.  His right knee has progressively gotten worse over time.  He is starting to have some functional limitations due to the progressive nature.  He has been seen in the office where he was found to have advanced arthritis with bone on bone and medial patellofemoral compartments, started to impact his daily activities and also interfering with his recreational activities.  It is felt he would benefit from undergoing surgical intervention.  Risks and benefits have been discussed.  There are no active contraindications ongoing such as infection or rapidly progressive neurological disease.  ALLERGIES:  No known drug allergies.  CURRENT MEDICATIONS:  Cholesterol tablet, Aleve, and Osteo Bi-Flex.  PAST MEDICAL HISTORY:  Hypercholesterolemia and osteoarthritis.  PAST SURGICAL HISTORY:  Carpal tunnel surgery in the left, thumb surgery on the left, arthroscopic knee surgery on the right.  FAMILY HISTORY:  Father with lung and brain cancer.  Mother with lung cancer.  SOCIAL HISTORY:  Married, retired, nonsmoker.  He does have a caregiver lined up.  He has 6-7 steps entering his home.  He does plan on going home after surgery.  He does have healthcare power of attorney.  REVIEW OF SYSTEMS:  GENERAL:  No fever, chills, or night sweats.  NEURO: Bi seizure, syncope, or paralysis.  RESPIRATORY:  No shortness of breath, productive cough, or hemoptysis.  CARDIOVASCULAR:  No chest pain, angina, or orthopnea.  GI:  No nausea, vomiting,  diarrhea, or constipation.  GU:  No dysuria, hematuria, or discharge. MUSCULOSKELETAL:  Knee pain.  PHYSICAL EXAMINATION:  VITAL SIGNS:  Pulse 72, respirations 12, blood pressure 129/80. GENERAL:  A 65 year old white male, well nourished, well developed, in no acute distress, slightly overweight.  Accompanied by his wife. Alert, oriented, and cooperative. HEENT:  Normocephalic, atraumatic.  Pupils are round and reactive.  EOMs intact. NECK:  Supple. CHEST:  Clear. HEART:  Regular rate and rhythm without murmur.  S1, S2 noted. ABDOMEN:  Soft, slightly round.  Bowel sounds present. RECTAL/BREASTS/GENITALIA:  Not done, not pertinent to present illness. EXTREMITIES:  Right knee, varus malalignment deformity, range of motion 10-120, marked crepitus, tender more medial than lateral, no instability.  IMPRESSION:  Osteoarthritis, right knee.  PLAN:  The patient admitted to [facility] to undergo a right total knee replacement arthroplasty.  Surgery will be performed by Dr. Ollen Gross.     Alexzandrew L. Julien Girt, P.A.C.   ______________________________ Ollen Gross, M.D.    ALP/MEDQ  D:  01/19/2011  T:  01/20/2011  Job:  [id number]  cc:   Dr. Dallas Schimke  Electronically Signed by Patrica Duel P.A.C. on 01/23/2011 10:30:48 AM Electronically Signed by Ollen Gross M.D. on 01/23/2011 04:06:32  PM

## 2011-02-05 NOTE — Discharge Summary (Signed)
NAMEMarland Pena  JONCARLO, FRIBERG NO.:  [id number]  MEDICAL RECORD NO.:  [id number]  LOCATION:  1618                         FACILITY:  [facility]  PHYSICIAN:  Ollen Gross, M.D.    DATE OF BIRTH:  March 30, 1946  DATE OF ADMISSION:  01/20/2011 DATE OF DISCHARGE:  01/23/2011                              DISCHARGE SUMMARY   ADMITTING DIAGNOSES: 1. Osteoarthritis, right knee. 2. Hypercholesterolemia.  DISCHARGE DIAGNOSES: 1. Osteoarthritis, right knee, status post right total knee     replacement arthroplasty. 2. Mild postop acute blood loss anemia, did not require transfusion. 3. Hypercholesterolemia.  PROCEDURE:  January 20, 2011, right total knee.  Surgeon:  Dr. Homero Fellers Aarib Pulido.  Assistant:  Patrica Duel, PA-C.  Anesthesia:  General. Tourniquet time:  43 minutes.  CONSULTS:  None.  BRIEF HISTORY:  The patient is a 65 year old male with advanced end- stage arthritis of the right knee with bone-on-bone medial and patellofemoral compartments who had extensive nonoperative management including injections.  He had persistent worsening pain and dysfunction, no contraindications to replacement, and now presents for total knee.  LABORATORY DATA:  Preop CBC showed a hemoglobin of 14.6, hematocrit of 43.0, white cell count of 6.9, platelets 273.  PT and INR 13.0 and 0.96 with a PTT of 34.  Chem panel on admission all within normal limits. Preop UA:  Small blood, few bacteria.  Blood group type O negative. Nasal swabs were negative for Staph aureus, negative for MRSA.  Serial CBCs were followed.  Hemoglobin dropped down to 12.3 and 10.9; last done H and H prior to discharge was 9.9 and 29.0.  Serial BMETs were followed for 48 hours.  Electrolytes all remained within normal limits.  HOSPITAL COURSE:  The patient was admitted to [facility], taken to OR, and underwent above-stated procedure without complication. The patient tolerated the procedure well, later was  transferred to the recovery room and orthopedic floor, started on p.o. and IV analgesic for pain control following the surgery.  He had a PCA and had that discontinued on day 1.  He was actually doing fairly well with pain control on the morning of day 1, had good urine output.  I put him on Xarelto for DVT prophylaxis.  He got up and ambulated over 50 feet on the morning of day 1.  By day 2, he was doing fairly well.  Dressing was changed, incision looked good.  Hemoglobin was stable at 10.9.  He was tolerating his medications and by day 3, he was progressing, meeting his goals with therapy and discharged home.  DISCHARGE PLAN: 1. The patient discharged home on January 23, 2011. 2. Discharge diagnoses:  Please see above. 3. Discharge medications:  Xarelto, Robaxin, and oxycodone.  Continue     home medications of fluticasone nasal spray and simvastatin. 4. Diet:  As tolerated. 5. Activity:  Weightbearing as tolerated, total knee protocol. 6. Followup:  In 2 weeks.  DISPOSITION:  Home.  CONDITION ON DISCHARGE:  Improved.  Dictated For:  Gus Rankin. Ilyssa Grennan, MD     Alexzandrew L. Julien Girt, P.A.C.   ______________________________ Ollen Gross, M.D.    ALP/MEDQ  D:  01/23/2011  T:  01/23/2011  Job:  [id number]  cc:   Dr. Patsy Lager  Electronically Signed by Ollen Gross M.D. on 02/05/2011 04:10:37 PM

## 2011-02-11 ENCOUNTER — Encounter: Payer: Self-pay | Admitting: Orthopedic Surgery

## 2011-02-24 ENCOUNTER — Encounter: Payer: Self-pay | Admitting: Orthopedic Surgery

## 2011-02-26 ENCOUNTER — Telehealth: Payer: Self-pay | Admitting: *Deleted

## 2011-02-26 NOTE — Telephone Encounter (Signed)
Pt would like to change form Dr. Patsy Lager to Dr. Sharen Hones, he didn't give a reason.

## 2011-02-26 NOTE — Telephone Encounter (Signed)
I notified patient.  Patient scheduled a CPX on 06/15/10 with Dr.Gutierrez.

## 2011-02-26 NOTE — Telephone Encounter (Signed)
i think i've seen him once before.  Ok by me if ok by Dr. Salena Saner.

## 2011-02-26 NOTE — Telephone Encounter (Signed)
Fine with me. I do not know well, but would not anticipate a prob.

## 2011-03-22 ENCOUNTER — Emergency Department: Payer: Self-pay | Admitting: Unknown Physician Specialty

## 2011-03-24 ENCOUNTER — Ambulatory Visit (INDEPENDENT_AMBULATORY_CARE_PROVIDER_SITE_OTHER): Payer: 59 | Admitting: Family Medicine

## 2011-03-24 ENCOUNTER — Telehealth: Payer: Self-pay | Admitting: Family Medicine

## 2011-03-24 ENCOUNTER — Encounter: Payer: Self-pay | Admitting: Family Medicine

## 2011-03-24 VITALS — BP 126/78 | HR 76 | Temp 97.7°F | Wt 220.4 lb

## 2011-03-24 DIAGNOSIS — M545 Low back pain: Secondary | ICD-10-CM

## 2011-03-24 DIAGNOSIS — Z87442 Personal history of urinary calculi: Secondary | ICD-10-CM

## 2011-03-24 DIAGNOSIS — N200 Calculus of kidney: Secondary | ICD-10-CM | POA: Insufficient documentation

## 2011-03-24 DIAGNOSIS — K5900 Constipation, unspecified: Secondary | ICD-10-CM | POA: Insufficient documentation

## 2011-03-24 LAB — POCT URINALYSIS DIPSTICK
Bilirubin, UA: NEGATIVE
Glucose, UA: NEGATIVE
Spec Grav, UA: 1.025
pH, UA: 6

## 2011-03-24 MED ORDER — TAMSULOSIN HCL 0.4 MG PO CAPS: 0.4000 mg | ORAL_CAPSULE | Freq: Every day | ORAL | Status: DC

## 2011-03-24 NOTE — Progress Notes (Signed)
Subjective:    Patient ID: [name], male    DOB: [birth year], 65 y.o.   MRN: [id number]  HPI CC: kidney stone.  Friday night started with several episodes of diarrhea at 3am.  This progressed to nausea and dry heaving, endorsing chills as well.  Also associated with sharp pains right lower quadrant of abdomen.  Pain got severe so went to ER.  No records available currently.  Blood tests, urine looked normal.  Then had CT scan - right sided kidney stone in ureter per pt.  Treated with morphine, went to bathroom and had some hematuria, pain improved.  Sent home with ibuprofen and percocets Saturday.  Sunday pain started again, this time different place - right lower back (sharp, stabbing).    Came into clinic as walkin this morning requesting to be set up with urology, in pain.  Placed on my schedule for this afternoon and appt made tomorrow with urology Dr. Garner Nash.  Has CT scan CD to take with him.  Currently pain improved after percocets kicked in.  H/o kidney stone 30 years ago.  No problems since.  Also no BM in 3 days (since diarrhea).  Records arrived => CT w/ contrast with 3mm stone distal R ureter above UVJ, mild R hydronephrosis.  Cr 1.29.  CMP WNL.  CBC WNL.  EKG NSR 62.    H/o gout, has had 2 flares in last few months.  Medications and allergies reviewed and updated in chart.  Past histories reviewed and updated if relevant as below. Patient Active Problem List  Diagnoses  . HYPERCHOLESTEROLEMIA  . ALLERGIC RHINITIS  . OTHER MALAISE AND FATIGUE  . DYSPNEA ON EXERTION  . NEPHROLITHIASIS, HX OF   Past Medical History  Diagnosis Date  . History of nephrolithiasis   . Hypercholesterolemia   . ED (erectile dysfunction)   . AR (allergic rhinitis)    Past Surgical History  Procedure Date  . Tonsillectomy and adenoidectomy 1954  . Rt knee arthroscopy 2000  . L thumb joint replace 2005    GRAMIG   . 2d echo 10/11    mild diastolic dysfunction, nl EF   . Total  knee arthroplasty 12/2010    right    History  Substance Use Topics  . Smoking status: Never Smoker   . Smokeless tobacco: Not on file  . Alcohol Use: No   Family History  Problem Relation Age of Onset  . Lung cancer      family hx  . Stroke      F 1st degree relative <60  . Stroke      M 1st degree relative <50  . Brain cancer      family hx    No Known Allergies Current Outpatient Prescriptions on File Prior to Visit  Medication Sig Dispense Refill  . fluticasone (FLONASE) 50 MCG/ACT nasal spray Place 2 sprays into the nose daily.        . sildenafil (VIAGRA) 100 MG tablet Take 100 mg by mouth as needed. Prior to intercourse.       . simvastatin (ZOCOR) 40 MG tablet Take 20 mg by mouth daily.         Review of Systems per HPI    Objective:   Physical Exam  Nursing note and vitals reviewed. Constitutional: He appears well-developed and well-nourished. No distress.  HENT:  Head: Normocephalic and atraumatic.  Mouth/Throat: Oropharynx is clear and moist. No oropharyngeal exudate.  Eyes: Conjunctivae and EOM are normal. Pupils  are equal, round, and reactive to light. No scleral icterus.  Neck: Normal range of motion. Neck supple.  Cardiovascular: Normal rate, regular rhythm, normal heart sounds and intact distal pulses.   No murmur heard. Pulmonary/Chest: Effort normal and breath sounds normal. No respiratory distress. He has no wheezes. He has no rales.  Abdominal: Soft. He exhibits no distension and no mass. Bowel sounds are decreased. There is no hepatosplenomegaly. There is no tenderness. There is no rigidity, no rebound, no guarding and no CVA tenderness.  Musculoskeletal: He exhibits edema (1+ pitting R>L).       S/p recent R TKR  Lymphadenopathy:    He has no cervical adenopathy.  Skin: Skin is warm and dry. No rash noted.  Psychiatric: He has a normal mood and affect.      Assessment & Plan:

## 2011-03-24 NOTE — Telephone Encounter (Signed)
Pt called, went to the ER [facility] over the weekend, was diag w/ Kidney Stone. Pt was referred to see a Urologist. Pt wants to see Dr. Cope, however, per his office they will not begin to see new pts until Feb 2013. Wife says pt will see another Urologist...cdavis  ° ° °

## 2011-03-24 NOTE — Patient Instructions (Addendum)
You have a 3 mm stone on the right side, you did have mild swelling of right kidney. Continue ibuprofen 800mg  up to three times a day. Use percocets for breakthrough pain. Start flomax - should help pass stone. Push water and fiber (fruits and vegetables).  Fiber handout provided today. Continue OTC stool softener.  Let me know if no stool in next 2-3 days. Keep appointment with urology for tomorrow. Sent home with strainer.

## 2011-03-24 NOTE — Telephone Encounter (Signed)
Pt called, went to the ER [facility] over the weekend, was diag w/ Kidney Stone. Pt was referred to see a Urologist. Pt wants to see Dr. Achilles Dunk, however, per his office they will not begin to see new pts until Feb 2013. Wife says pt will see another Urologist...cdavis

## 2011-03-24 NOTE — Assessment & Plan Note (Signed)
Of 3d duration.  On stool softener, unknown type. rec push water for stone and constipation. Push fiber (fiber handout provided). If no stool in next 2 days, to notify me for further recs.

## 2011-03-24 NOTE — Assessment & Plan Note (Signed)
New since last 30 yrs. CT scan report - 3mm stone, should pass on it's own. Pain controlled now that percocets have kicked in. Advised continue ibuprofen scheduled, percocets for breakthrough. Prescribed flomax to help pass stone, provided with strainer. Given mild hydro, reasonable to see uro to follow.

## 2011-03-24 NOTE — Telephone Encounter (Signed)
Placed order in chart.   Having continued pain despite percocets/ibuprfoen prescribed at ER.  Walked in today, offered appt.   routed to [place].

## 2011-03-25 ENCOUNTER — Encounter: Payer: Self-pay | Admitting: Family Medicine

## 2011-03-27 ENCOUNTER — Encounter: Payer: Self-pay | Admitting: Orthopedic Surgery

## 2011-04-14 ENCOUNTER — Other Ambulatory Visit: Payer: Self-pay | Admitting: *Deleted

## 2011-04-14 MED ORDER — FLUTICASONE PROPIONATE 50 MCG/ACT NA SUSP: 2.0000 | Freq: Every day | NASAL | Status: DC

## 2011-06-03 ENCOUNTER — Other Ambulatory Visit: Payer: Self-pay | Admitting: Family Medicine

## 2011-06-03 DIAGNOSIS — Z862 Personal history of diseases of the blood and blood-forming organs and certain disorders involving the immune mechanism: Secondary | ICD-10-CM

## 2011-06-03 DIAGNOSIS — Z125 Encounter for screening for malignant neoplasm of prostate: Secondary | ICD-10-CM

## 2011-06-03 DIAGNOSIS — E7800 Pure hypercholesterolemia, unspecified: Secondary | ICD-10-CM

## 2011-06-10 ENCOUNTER — Other Ambulatory Visit (INDEPENDENT_AMBULATORY_CARE_PROVIDER_SITE_OTHER): Payer: 59

## 2011-06-10 DIAGNOSIS — Z125 Encounter for screening for malignant neoplasm of prostate: Secondary | ICD-10-CM

## 2011-06-10 DIAGNOSIS — Z862 Personal history of diseases of the blood and blood-forming organs and certain disorders involving the immune mechanism: Secondary | ICD-10-CM

## 2011-06-10 DIAGNOSIS — E7800 Pure hypercholesterolemia, unspecified: Secondary | ICD-10-CM

## 2011-06-10 LAB — CBC WITH DIFFERENTIAL/PLATELET
Basophils Relative: 0.4 % (ref 0.0–3.0)
Eosinophils Absolute: 0.4 10*3/uL (ref 0.0–0.7)
HCT: 42.5 % (ref 39.0–52.0)
Hemoglobin: 14.8 g/dL (ref 13.0–17.0)
Lymphocytes Relative: 29.6 % (ref 12.0–46.0)
MCHC: 34.8 g/dL (ref 30.0–36.0)
Neutro Abs: 4 10*3/uL (ref 1.4–7.7)
RBC: 4.97 Mil/uL (ref 4.22–5.81)

## 2011-06-10 LAB — LIPID PANEL
HDL: 47.5 mg/dL (ref >39.00)
LDL Cholesterol: 97 mg/dL (ref 0–99)
Total CHOL/HDL Ratio: 4
Triglycerides: 130 mg/dL (ref 0.0–149.0)
VLDL: 26 mg/dL (ref 0.0–40.0)

## 2011-06-10 LAB — COMPREHENSIVE METABOLIC PANEL
ALT: 22 U/L (ref 0–53)
AST: 22 U/L (ref 0–37)
Alkaline Phosphatase: 84 U/L (ref 39–117)
Creatinine, Ser: 0.9 mg/dL (ref 0.4–1.5)
GFR: 89.54 mL/min (ref >60.00)
Total Bilirubin: 0.8 mg/dL (ref 0.3–1.2)

## 2011-06-16 ENCOUNTER — Ambulatory Visit (INDEPENDENT_AMBULATORY_CARE_PROVIDER_SITE_OTHER): Payer: 59 | Admitting: Family Medicine

## 2011-06-16 ENCOUNTER — Encounter: Payer: Self-pay | Admitting: Family Medicine

## 2011-06-16 VITALS — BP 128/74 | HR 76 | Temp 97.6°F | Ht 68.0 in | Wt 224.5 lb

## 2011-06-16 DIAGNOSIS — R7303 Prediabetes: Secondary | ICD-10-CM | POA: Insufficient documentation

## 2011-06-16 DIAGNOSIS — R7301 Impaired fasting glucose: Secondary | ICD-10-CM | POA: Insufficient documentation

## 2011-06-16 DIAGNOSIS — Z1211 Encounter for screening for malignant neoplasm of colon: Secondary | ICD-10-CM

## 2011-06-16 DIAGNOSIS — R3913 Splitting of urinary stream: Secondary | ICD-10-CM | POA: Insufficient documentation

## 2011-06-16 DIAGNOSIS — Z0000 Encounter for general adult medical examination without abnormal findings: Secondary | ICD-10-CM | POA: Insufficient documentation

## 2011-06-16 NOTE — Assessment & Plan Note (Addendum)
Preventative protocols reviewed and updated. See HPI. rtc 1 yr or as needed. Slightly elevated sugar today, will monitor.  Discussed less added sugars, sweets, etc. Hearing screen normal.

## 2011-06-16 NOTE — Progress Notes (Signed)
Subjective:    Patient ID: Bari Mantis, male    DOB: [birth year], 66 y.o.   MRN: [id number]  HPI CC: annual physical, non medicare.  Cough for 2 months, sneezing, RN, congestion.  Has tried OTC remedies, didn't help.  Coughing fits lead to choking.  Saw Rob who gave him OTC cough syrup and med, has helped.  Feeling better from this.  No fevers/chills.  Since catheterization for R knee replacement, noticing trouble with aim with stream, 2 different streams.  Also with some changes in strength of flow.  Denies problems prior to surgery.  Had R knee replaced 12/2010, doing great. Had kidney stone 02/2011.  Preventative: Colonoscopy 2008 - [facility], told normal.  Ok for 10 years.  Asked [facility] to have him fill out ROI form today. Prostate - yearly.  All normal. Flu 02/2011 Pneumonia 05/2010 Td 07/2009 Shingles - would consider.  Stays active playing golf No falls in last year. No anhedonia, denies depression Hearing - some trouble with whispers.  requests hearing screen today. Vision - sees eye doctor yearly, last 07/2010.  Caffeine: occaisonally, 2 diet cokes/day Married, lives with wife Retired: Nutritional therapist Activity: gets regular exercise - bike, elliptical, golfing  Medications and allergies reviewed and updated in chart.  Past histories reviewed and updated if relevant as below. Patient Active Problem List  Diagnoses  . HYPERCHOLESTEROLEMIA  . ALLERGIC RHINITIS  . OTHER MALAISE AND FATIGUE  . DYSPNEA ON EXERTION  . NEPHROLITHIASIS, HX OF  . Kidney stone  . Constipation   Past Medical History  Diagnosis Date  . History of nephrolithiasis     seen Dr. Garner Nash Urology, last 02/2011  . Hypercholesterolemia   . ED (erectile dysfunction)   . AR (allergic rhinitis)   . Gout     per pt   Past Surgical History  Procedure Date  . Tonsillectomy and adenoidectomy 1954  . Rt knee arthroscopy 2000  . L thumb joint replace 2005    GRAMIG   . 2d echo 10/11    mild diastolic  dysfunction, nl EF   . Total knee arthroplasty 12/2010    right    History  Substance Use Topics  . Smoking status: Never Smoker   . Smokeless tobacco: Not on file  . Alcohol Use: No   Family History  Problem Relation Age of Onset  . Lung cancer      family hx  . Stroke      F 1st degree relative <60  . Stroke      M 1st degree relative <50  . Brain cancer      family hx    No Known Allergies Current Outpatient Prescriptions on File Prior to Visit  Medication Sig Dispense Refill  . aspirin 81 MG tablet Take 81 mg by mouth daily.        . fluticasone (FLONASE) 50 MCG/ACT nasal spray Place 2 sprays into the nose daily.  16 g  3  . ibuprofen (ADVIL,MOTRIN) 200 MG tablet Take 200 mg by mouth every 6 (six) hours as needed.        . sildenafil (VIAGRA) 100 MG tablet Take 100 mg by mouth as needed. Prior to intercourse.       . simvastatin (ZOCOR) 40 MG tablet Take 20 mg by mouth daily.         Review of Systems  Constitutional: Negative for fever, chills, activity change, appetite change, fatigue and unexpected weight change.  HENT: Negative for hearing loss and  neck pain.   Eyes: Negative for visual disturbance.  Respiratory: Negative for cough, chest tightness, shortness of breath and wheezing.   Cardiovascular: Negative for chest pain, palpitations and leg swelling.  Gastrointestinal: Negative for nausea, vomiting, abdominal pain, diarrhea, constipation, blood in stool and abdominal distention.  Genitourinary: Negative for hematuria and difficulty urinating.  Musculoskeletal: Negative for myalgias and arthralgias.  Skin: Negative for rash.  Neurological: Negative for dizziness, seizures, syncope and headaches.  Hematological: Does not bruise/bleed easily.  Psychiatric/Behavioral: Negative for dysphoric mood. The patient is not nervous/anxious.        Objective:   Physical Exam  Nursing note and vitals reviewed. Constitutional: He is oriented to person, place, and time. He  appears well-developed and well-nourished. No distress.  HENT:  Head: Normocephalic and atraumatic.  Right Ear: External ear normal.  Left Ear: External ear normal.  Nose: Nose normal.  Mouth/Throat: Oropharynx is clear and moist. No oropharyngeal exudate.  Eyes: Conjunctivae and EOM are normal. Pupils are equal, round, and reactive to light. No scleral icterus.  Neck: Normal range of motion. Neck supple. No thyromegaly present.  Cardiovascular: Normal rate, regular rhythm, normal heart sounds and intact distal pulses.   No murmur heard. Pulses:      Radial pulses are 2+ on the right side, and 2+ on the left side.  Pulmonary/Chest: Effort normal and breath sounds normal. No respiratory distress. He has no wheezes. He has no rales.  Abdominal: Soft. Bowel sounds are normal. He exhibits no distension and no mass. There is no tenderness. There is no rebound and no guarding. Hernia confirmed negative in the right inguinal area and confirmed negative in the left inguinal area.  Genitourinary: Rectum normal, prostate normal, testes normal and penis normal. Rectal exam shows no external hemorrhoid, no internal hemorrhoid, no fissure, no mass, no tenderness and anal tone normal. Guaiac negative stool. Prostate is not enlarged and not tender. Right testis shows no mass, no swelling and no tenderness. Right testis is descended. Left testis shows no mass, no swelling and no tenderness. Left testis is descended. Circumcised. No phimosis or penile tenderness.       Small left hydrocele  Musculoskeletal: Normal range of motion.  Lymphadenopathy:    He has no cervical adenopathy.       Right: No inguinal adenopathy present.       Left: No inguinal adenopathy present.  Neurological: He is alert and oriented to person, place, and time.       CN grossly intact, station and gait intact  Skin: Skin is warm and dry. No rash noted.  Psychiatric: He has a normal mood and affect. His behavior is normal. Judgment  and thought content normal.       Assessment & Plan:

## 2011-06-16 NOTE — Patient Instructions (Addendum)
Call your insurace about the shingles shot to see if it is covered or how much it would cost and where is cheaper (here or pharmacy).  If you want to receive here, call for nurse visit. Good to see you today, call us with questions. Fill out ROI for colonoscopy 2008 from [facility]). Return in 1 year, sooner if needed.

## 2011-06-16 NOTE — Assessment & Plan Note (Signed)
As not significantly bothering him and seems to be improving, will monitor for now. Advised if worsening to let us know , likely refer to urology for further eval..

## 2011-06-19 ENCOUNTER — Other Ambulatory Visit: Payer: Self-pay | Admitting: *Deleted

## 2011-06-19 MED ORDER — SIMVASTATIN 40 MG PO TABS: 20.0000 mg | ORAL_TABLET | Freq: Every day | ORAL | Status: DC

## 2011-06-29 ENCOUNTER — Encounter: Payer: Self-pay | Admitting: Family Medicine

## 2011-07-15 ENCOUNTER — Other Ambulatory Visit: Payer: Self-pay | Admitting: *Deleted

## 2011-07-15 MED ORDER — SILDENAFIL CITRATE 100 MG PO TABS: 100.0000 mg | ORAL_TABLET | ORAL | Status: DC | PRN

## 2011-08-20 ENCOUNTER — Encounter: Payer: Self-pay | Admitting: Orthopedic Surgery

## 2011-08-25 ENCOUNTER — Encounter: Payer: Self-pay | Admitting: Orthopedic Surgery

## 2011-09-24 ENCOUNTER — Encounter: Payer: Self-pay | Admitting: Orthopedic Surgery

## 2011-10-21 ENCOUNTER — Encounter: Payer: Self-pay | Admitting: Family Medicine

## 2011-10-21 ENCOUNTER — Ambulatory Visit (INDEPENDENT_AMBULATORY_CARE_PROVIDER_SITE_OTHER): Payer: 59 | Admitting: Family Medicine

## 2011-10-21 VITALS — BP 126/80 | HR 72 | Temp 98.2°F | Wt 218.0 lb

## 2011-10-21 DIAGNOSIS — J40 Bronchitis, not specified as acute or chronic: Secondary | ICD-10-CM

## 2011-10-21 DIAGNOSIS — R0981 Nasal congestion: Secondary | ICD-10-CM | POA: Insufficient documentation

## 2011-10-21 MED ORDER — AZITHROMYCIN 250 MG PO TABS: ORAL_TABLET | ORAL | Status: AC

## 2011-10-21 MED ORDER — GUAIFENESIN-CODEINE 100-10 MG/5ML PO SYRP: 5.0000 mL | ORAL_SOLUTION | Freq: Two times a day (BID) | ORAL | Status: AC | PRN

## 2011-10-21 MED ORDER — ALBUTEROL SULFATE HFA 108 (90 BASE) MCG/ACT IN AERS: 2.0000 | INHALATION_SPRAY | Freq: Four times a day (QID) | RESPIRATORY_TRACT | Status: DC | PRN

## 2011-10-21 NOTE — Progress Notes (Signed)
  Subjective:    Patient ID: [name], male    DOB: April 10, 1946, 66 y.o.   MRN: [id number]  HPI CC: cough  Cough present for a few wks.  Then for last 5d cough got worse.  Now when coughing, has fits and SOB associated with coughing.  Productive of clear phlegm.  Some post tussive gagging.  Mild HA from cough.  + sinus congestion/pressure.  Mild ST.  Has tried OTC meds including guaifenesin and DM.  Denies diaphoresis, fevers, abd pain, n/v, ear or tooth pain.  H/o allergic rhinitis.  No smokers at home.  + wife sick recently.  No h/o asthma/COPD.  Past Medical History  Diagnosis Date  . History of nephrolithiasis     seen Dr. Garner Nash Urology, last 02/2011  . Hypercholesterolemia   . ED (erectile dysfunction)   . AR (allergic rhinitis)   . Gout     per pt    Review of Systems Per HPI    Objective:   Physical Exam  Nursing note and vitals reviewed. Constitutional: He appears well-developed and well-nourished. No distress.  HENT:  Head: Normocephalic and atraumatic.  Right Ear: Hearing, tympanic membrane, external ear and ear canal normal.  Left Ear: Hearing, tympanic membrane, external ear and ear canal normal.  Nose: Nose normal. No mucosal edema or rhinorrhea. Right sinus exhibits no maxillary sinus tenderness and no frontal sinus tenderness. Left sinus exhibits no maxillary sinus tenderness and no frontal sinus tenderness.  Mouth/Throat: Uvula is midline and mucous membranes are normal. Posterior oropharyngeal erythema present. No oropharyngeal exudate, posterior oropharyngeal edema or tonsillar abscesses.  Eyes: Conjunctivae and EOM are normal. Pupils are equal, round, and reactive to light. No scleral icterus.  Neck: Normal range of motion. Neck supple.  Cardiovascular: Normal rate, regular rhythm, normal heart sounds and intact distal pulses.   No murmur heard. Pulmonary/Chest: Effort normal and breath sounds normal. No respiratory distress. He has no wheezes.  He has no rales.       Somewhat tight  Lymphadenopathy:    He has no cervical adenopathy.  Skin: Skin is warm and dry. No rash noted.       Assessment & Plan:

## 2011-10-21 NOTE — Assessment & Plan Note (Signed)
Bronchitis with bronchospasm - treat with zpack as well as albuterol. cheratussin for cough at night. Update [place] if sxs not resolved with treatment. advised to let me know if SOB not better with albuterol.

## 2011-10-21 NOTE — Patient Instructions (Signed)
I think you have bronchitis. Treat with zpack, albuterol inhaler regularly for next few days (every 4-6 hours) then as needed, and cheratussin for cough at night. Continue guaifenesin and DM (with plenty of fliud). Update [place] if fever 101, worsening cough, or other concerns. Let me know if shortness of breath not better with treatment.

## 2012-04-16 ENCOUNTER — Other Ambulatory Visit: Payer: Self-pay | Admitting: Family Medicine

## 2012-06-12 ENCOUNTER — Other Ambulatory Visit: Payer: Self-pay | Admitting: Family Medicine

## 2012-06-12 DIAGNOSIS — E7800 Pure hypercholesterolemia, unspecified: Secondary | ICD-10-CM

## 2012-06-12 DIAGNOSIS — Z125 Encounter for screening for malignant neoplasm of prostate: Secondary | ICD-10-CM

## 2012-06-16 ENCOUNTER — Other Ambulatory Visit (INDEPENDENT_AMBULATORY_CARE_PROVIDER_SITE_OTHER): Payer: 59

## 2012-06-16 DIAGNOSIS — Z125 Encounter for screening for malignant neoplasm of prostate: Secondary | ICD-10-CM

## 2012-06-16 DIAGNOSIS — E7800 Pure hypercholesterolemia, unspecified: Secondary | ICD-10-CM

## 2012-06-16 LAB — BASIC METABOLIC PANEL
BUN: 17 mg/dL (ref 6–23)
Chloride: 105 mEq/L (ref 96–112)
GFR: 78.14 mL/min (ref >60.00)
Glucose, Bld: 114 mg/dL — ABNORMAL HIGH (ref 70–99)
Potassium: 4.1 mEq/L (ref 3.5–5.1)

## 2012-06-16 LAB — PSA: PSA: 0.44 ng/mL (ref 0.10–4.00)

## 2012-06-16 LAB — LIPID PANEL
LDL Cholesterol: 92 mg/dL (ref 0–99)
VLDL: 12.8 mg/dL (ref 0.0–40.0)

## 2012-06-21 ENCOUNTER — Encounter: Payer: 59 | Admitting: Family Medicine

## 2012-06-21 ENCOUNTER — Ambulatory Visit (INDEPENDENT_AMBULATORY_CARE_PROVIDER_SITE_OTHER): Payer: 59 | Admitting: Family Medicine

## 2012-06-21 ENCOUNTER — Encounter: Payer: Self-pay | Admitting: Family Medicine

## 2012-06-21 VITALS — BP 142/84 | HR 76 | Temp 98.2°F | Ht 68.0 in | Wt 227.0 lb

## 2012-06-21 DIAGNOSIS — E7800 Pure hypercholesterolemia, unspecified: Secondary | ICD-10-CM

## 2012-06-21 DIAGNOSIS — R0981 Nasal congestion: Secondary | ICD-10-CM

## 2012-06-21 DIAGNOSIS — J3489 Other specified disorders of nose and nasal sinuses: Secondary | ICD-10-CM

## 2012-06-21 DIAGNOSIS — Z0000 Encounter for general adult medical examination without abnormal findings: Secondary | ICD-10-CM

## 2012-06-21 DIAGNOSIS — S76119A Strain of unspecified quadriceps muscle, fascia and tendon, initial encounter: Secondary | ICD-10-CM

## 2012-06-21 DIAGNOSIS — IMO0002 Reserved for concepts with insufficient information to code with codable children: Secondary | ICD-10-CM

## 2012-06-21 DIAGNOSIS — R7301 Impaired fasting glucose: Secondary | ICD-10-CM

## 2012-06-21 NOTE — Assessment & Plan Note (Signed)
Chronic, stable, reviewed #s with pt.

## 2012-06-21 NOTE — Assessment & Plan Note (Signed)
Preventative protocols reviewed and updated unless pt declined. Discussed healthy diet and lifestyle.  

## 2012-06-21 NOTE — Assessment & Plan Note (Signed)
Anticipate viral URI with cough currently - discussed this.  Supportive care as per instructions. Red flags to update [place] discussed.

## 2012-06-21 NOTE — Assessment & Plan Note (Signed)
Reviewed with patient, discussed importance of weight loss and close monitoring of diet. Discussed concern with possible prediabetes. Check A1c next blood work.

## 2012-06-21 NOTE — Progress Notes (Signed)
Subjective:    Patient ID: [name], male    DOB: 03/29/1946, 67 y.o.   MRN: [id number]  HPI CC: annual exam  While putting up lights over christmas, missed a step and over flexed knee.  Hurt left quad, large bruise afterwards.  Painful since, improving slowly.  Using ibuprofen for this.  Cold currently - taking OTC remedies - decongestant PM.  Started in chest, moving to head.  Worse congestion in am.  Going on for over 1 week now.  + cough.  Sleeping well.  Drinking plenty of water.  No fevers/chills.    Preventative:  Colonoscopy 2008 - normal, rpt 10 yrs [name], [place]) Prostate - yearly. All normal.  Flu done 2013 Pneumonia 05/2010 - completed Td 07/2009  Shingles - discussed in past.  Would consider.  Will check with insurance.  Has not had chicken pox.  Body mass index is 34.52 kg/(m^2). Stays active playing golf  No falls in last year.  No anhedonia, denies depression   Caffeine: occaisonally, 2 diet cokes/day Married, lives with wife Retired: Nutritional therapist Activity: gets regular exercise - bike, elliptical, golfing   Medications and allergies reviewed and updated in chart.  Past histories reviewed and updated if relevant as below. Patient Active Problem List  Diagnosis  . HYPERCHOLESTEROLEMIA  . ALLERGIC RHINITIS  . DYSPNEA ON EXERTION  . NEPHROLITHIASIS, HX OF  . Kidney stone  . Constipation  . Healthcare maintenance  . Urinary stream splitting  . Impaired fasting blood sugar  . Bronchitis   Past Medical History  Diagnosis Date  . History of nephrolithiasis     seen Dr. Garner Nash Urology, last 02/2011  . Hypercholesterolemia   . ED (erectile dysfunction)   . AR (allergic rhinitis)   . Gout     per pt   Past Surgical History  Procedure Date  . Tonsillectomy and adenoidectomy 1954  . Rt knee arthroscopy 2000  . L thumb joint replace 2005    GRAMIG   . 2d echo 10/11    mild diastolic dysfunction, nl EF   . Total knee arthroplasty 12/2010    right     . Colonoscopy 05/2006    normal, rpt 10 yrs [name], [place])   History  Substance Use Topics  . Smoking status: Never Smoker   . Smokeless tobacco: Never Used  . Alcohol Use: No   Family History  Problem Relation Age of Onset  . Lung cancer      family hx  . Stroke      F 1st degree relative <60  . Stroke      M 1st degree relative <50  . Brain cancer      family hx    Allergies  Allergen Reactions  . Prednisone Rash   Current Outpatient Prescriptions on File Prior to Visit  Medication Sig Dispense Refill  . albuterol (PROVENTIL HFA;VENTOLIN HFA) 108 (90 BASE) MCG/ACT inhaler Inhale 2 puffs into the lungs every 6 (six) hours as needed for wheezing.  1 Inhaler  2  . aspirin 81 MG tablet Take 81 mg by mouth daily.        . fluticasone (FLONASE) 50 MCG/ACT nasal spray Place 2 sprays into the nose daily.  16 g  3  . ibuprofen (ADVIL,MOTRIN) 200 MG tablet Take 200 mg by mouth every 6 (six) hours as needed.        . simvastatin (ZOCOR) 40 MG tablet Take 0.5 tablets (20 mg total) by mouth daily.  30  tablet  11  . VIAGRA 100 MG tablet TAKE 1 TABLET BY MOUTH AS NEEDED PRIOR  TO INTERCOURSE  10 tablet  1     Review of Systems  Constitutional: Negative for fever, chills, activity change, appetite change, fatigue and unexpected weight change.  HENT: Negative for hearing loss and neck pain.   Eyes: Negative for visual disturbance.  Respiratory: Negative for cough, chest tightness, shortness of breath and wheezing.   Cardiovascular: Negative for chest pain, palpitations and leg swelling.  Gastrointestinal: Negative for nausea, vomiting, abdominal pain, diarrhea, constipation, blood in stool and abdominal distention.  Genitourinary: Negative for hematuria and difficulty urinating.  Musculoskeletal: Negative for myalgias and arthralgias.  Skin: Negative for rash.  Neurological: Negative for dizziness, seizures, syncope and headaches.  Hematological: Does not bruise/bleed easily.   Psychiatric/Behavioral: Positive for dysphoric mood (trouble adjusting to change in church). The patient is not nervous/anxious.        Objective:   Physical Exam  Nursing note and vitals reviewed. Constitutional: He is oriented to person, place, and time. He appears well-developed and well-nourished. No distress.       obese  HENT:  Head: Normocephalic and atraumatic.  Right Ear: Hearing, tympanic membrane, external ear and ear canal normal.  Left Ear: Hearing, tympanic membrane, external ear and ear canal normal.  Nose: Nose normal.  Mouth/Throat: Oropharynx is clear and moist. No oropharyngeal exudate.  Eyes: Conjunctivae normal and EOM are normal. Pupils are equal, round, and reactive to light. No scleral icterus.  Neck: Normal range of motion. Neck supple. No thyromegaly present.  Cardiovascular: Normal rate, regular rhythm, normal heart sounds and intact distal pulses.   No murmur heard. Pulses:      Radial pulses are 2+ on the right side, and 2+ on the left side.  Pulmonary/Chest: Effort normal and breath sounds normal. No respiratory distress. He has no wheezes. He has no rales.       Lungs clear  Abdominal: Soft. Bowel sounds are normal. He exhibits no distension and no mass. There is no tenderness. There is no rebound and no guarding.  Genitourinary: Rectum normal and prostate normal. Rectal exam shows no external hemorrhoid, no internal hemorrhoid, no fissure, no mass, no tenderness and anal tone normal. Prostate is not enlarged (10gm) and not tender.  Musculoskeletal: Normal range of motion. He exhibits no edema.       No bruising of lower legs. No pain to palpation of thighs. FROM at hips.  Lymphadenopathy:    He has no cervical adenopathy.  Neurological: He is alert and oriented to person, place, and time.       CN grossly intact, station and gait intact  Skin: Skin is warm and dry. No rash noted.  Psychiatric: He has a normal mood and affect. His behavior is normal.  Judgment and thought content normal.       Assessment & Plan:

## 2012-06-21 NOTE — Assessment & Plan Note (Signed)
Slowly resolving. Provided with stretching exercises today. Discussed wrapping quads.

## 2012-06-21 NOTE — Patient Instructions (Addendum)
Let's work on diet, exercise to lose weight for better blood pressure control.  Keep an eye on BPs at home and let me know if consistently >140/90. Call your insurance about the shingles shot to see if it is covered or how much it would cost and where is cheaper (here or pharmacy).  If you want to receive here, call for nurse visit. Your sugar returned high - keep an eye on sugar intake.  We will recheck next visit . For congestion - start mucinex with plenty of fluid, let me know if not improving with this. Good to see you today, call us with questions.

## 2012-07-02 ENCOUNTER — Other Ambulatory Visit: Payer: Self-pay | Admitting: Family Medicine

## 2012-07-05 ENCOUNTER — Other Ambulatory Visit: Payer: Self-pay | Admitting: Family Medicine

## 2012-08-07 ENCOUNTER — Other Ambulatory Visit: Payer: Self-pay | Admitting: Family Medicine

## 2012-08-30 ENCOUNTER — Ambulatory Visit (INDEPENDENT_AMBULATORY_CARE_PROVIDER_SITE_OTHER): Payer: 59 | Admitting: Family Medicine

## 2012-08-30 ENCOUNTER — Encounter: Payer: Self-pay | Admitting: Family Medicine

## 2012-08-30 VITALS — BP 130/78 | HR 84 | Temp 98.1°F | Wt 225.5 lb

## 2012-08-30 DIAGNOSIS — R05 Cough: Secondary | ICD-10-CM

## 2012-08-30 DIAGNOSIS — R059 Cough, unspecified: Secondary | ICD-10-CM

## 2012-08-30 NOTE — Patient Instructions (Signed)
I think these symptoms are more due to allergies. Treat with antihistamine (like claritin, zyrtec, or allegra) daily as well as nasal saline irrigation. Start advair inhaler once daily for next 2 weeks. Consider returning for spirometry (lung test) to evaluate for asthma. Good to see you, call us with quesitons. Please let me know if fever >101, worsening productive cough or other concerning symptoms develop.

## 2012-08-30 NOTE — Progress Notes (Addendum)
  Subjective:    Patient ID: [name], male    DOB: 03-09-1946, 67 y.o.   MRN: [id number]  HPI CC: cold sxs  3d h/o URI sxs including significant watery eyes and itchy eyes.  Then cough started - productive of clear mucous.  Hoarse voice present as well.  + HA thought due to coughing. Cough wakes him up at night.  Main concern is irritating cough.  Feels congested in chest > nose.  So far has taken albuterol inhaler which seems to help.  Not using flonase regularly.  No ST, PNdrainage, fevers/chills, abd pain, ear or tooth pain.  Recent return from cruise. H/o seasonal allergies. Son sick on Friday.   No smokers at home. No h/o asthma.  Actually..he remembers that prior to sxs onset he did mow lawn Friday without using mask, exposed to pollen and grass.  Past Medical History  Diagnosis Date  . History of nephrolithiasis     seen Dr. [facility], last 02/2011  . Hypercholesterolemia   . ED (erectile dysfunction)   . AR (allergic rhinitis)   . Gout     per pt     Review of Systems Per HPI    Objective:   Physical Exam  Nursing note and vitals reviewed. Constitutional: He appears well-developed and well-nourished. No distress.  HENT:  Head: Normocephalic and atraumatic.  Right Ear: Hearing, tympanic membrane, external ear and ear canal normal.  Left Ear: Hearing, tympanic membrane, external ear and ear canal normal.  Nose: Mucosal edema present. No rhinorrhea. Right sinus exhibits frontal sinus tenderness (mild). Right sinus exhibits no maxillary sinus tenderness. Left sinus exhibits frontal sinus tenderness (mild). Left sinus exhibits no maxillary sinus tenderness.  Mouth/Throat: Uvula is midline, oropharynx is clear and moist and mucous membranes are normal. No oropharyngeal exudate, posterior oropharyngeal edema, posterior oropharyngeal erythema or tonsillar abscesses.  Some posterior oropharyngeal cobblestoning  Eyes: Conjunctivae and EOM are normal. Pupils  are equal, round, and reactive to light. No scleral icterus.  Neck: Normal range of motion. Neck supple.  Cardiovascular: Normal rate, regular rhythm, normal heart sounds and intact distal pulses.   No murmur heard. Pulmonary/Chest: Effort normal and breath sounds normal. No respiratory distress. He has no wheezes. He has no rales.  Somewhat tight  Lymphadenopathy:    He has no cervical adenopathy.  Skin: Skin is warm and dry. No rash noted.       Assessment & Plan:

## 2012-08-30 NOTE — Assessment & Plan Note (Signed)
I don't think this cough is infectious in nature but rather allergic. Continue albuterol scheduled for next few days, start advair diskus - 2 wk sample provided today. Update me if infectious sxs develop, consider return when feeling better for spirometry to eval for new onset asthma. Start antihistamine, INS, nasal saline.

## 2012-11-05 ENCOUNTER — Other Ambulatory Visit: Payer: Self-pay | Admitting: Family Medicine

## 2012-11-23 ENCOUNTER — Ambulatory Visit (INDEPENDENT_AMBULATORY_CARE_PROVIDER_SITE_OTHER): Payer: 59 | Admitting: Family Medicine

## 2012-11-23 ENCOUNTER — Ambulatory Visit (INDEPENDENT_AMBULATORY_CARE_PROVIDER_SITE_OTHER)
Admission: RE | Admit: 2012-11-23 | Discharge: 2012-11-23 | Disposition: A | Discharge status: Home / Self Care | Payer: 59 | Source: Ambulatory Visit | Attending: Family Medicine | Admitting: Family Medicine

## 2012-11-23 ENCOUNTER — Encounter: Payer: Self-pay | Admitting: Family Medicine

## 2012-11-23 VITALS — BP 136/72 | HR 68 | Temp 98.0°F | Wt 215.8 lb

## 2012-11-23 DIAGNOSIS — S79919A Unspecified injury of unspecified hip, initial encounter: Secondary | ICD-10-CM

## 2012-11-23 DIAGNOSIS — S76102A Unspecified injury of left quadriceps muscle, fascia and tendon, initial encounter: Secondary | ICD-10-CM

## 2012-11-23 DIAGNOSIS — S79929A Unspecified injury of unspecified thigh, initial encounter: Secondary | ICD-10-CM

## 2012-11-23 DIAGNOSIS — W108XXA Fall (on) (from) other stairs and steps, initial encounter: Secondary | ICD-10-CM

## 2012-11-23 MED ORDER — NAPROXEN 500 MG PO TABS: ORAL_TABLET | ORAL | Status: DC

## 2012-11-23 NOTE — Addendum Note (Signed)
Addended by: Eustaquio Boyden on: 11/23/2012 11:52 AM   Modules accepted: Orders

## 2012-11-23 NOTE — Assessment & Plan Note (Addendum)
Concern for lateral quad rupture with palpable knot. Will start with plain films to eval calcinosis and refer to ortho. NSAIDs in interim. Pt agrees with plan.

## 2012-11-23 NOTE — Patient Instructions (Addendum)
I am concerned for lateral quadricep muscle injury. Xray of left leg today. Treat pain with prescription strength naprosyn as needed (take with food). Pass by Marion's office for referral to orthopedist. Good to see you today, call us with questions.

## 2012-11-23 NOTE — Progress Notes (Signed)
  Subjective:    Patient ID: [name], male    DOB: [birth year], 67 y.o.   MRN: [id number]  HPI CC: pulled muscle?  DOI: 05/2012 - after chirstmas, storing christmas decorations into attic, missed step and L knee was excessively flexed.  Had trouble getting out of attic.  Significant pain that lasted several weeks anterior left thigh. Pain has subsided.  Doesn't remember if there was any bruising. Has been going to gym regularly - continues with pain intermittently.  Describes sharp pain anterior lateral thigh. No knee pain.  Some lateral thigh numbness that precedes above injury.  Notes improvement when going to pool, when stretching thigh. Ran 3 miles this morning, no pain.  So far has tried no medicines for this.  H/o lower back pain in past, when doing exercises on floor, unable to complete 2/2 thigh pain.  Last lower back PT was 4 yrs ago.  Past Medical History  Diagnosis Date  . History of nephrolithiasis     seen Dr. [facility], last 02/2011  . Hypercholesterolemia   . ED (erectile dysfunction)   . AR (allergic rhinitis)   . Gout     per pt     Review of Systems Per HPI    Objective:   Physical Exam  Nursing note and vitals reviewed. Constitutional: He appears well-developed and well-nourished. No distress.  Musculoskeletal: He exhibits no edema.  FROM but tender at full flexion of hip/knee on left. Palpable knot proximal mid thigh. Neg FABER, neg SLR test. No pain with int/ext rotation at hip  Neurological: A sensory deficit is present.  5/5 strength BLE Decreased sensation lateral distal thigh  Skin: Skin is warm and dry.  No ecchymosis       Assessment & Plan:

## 2012-12-06 ENCOUNTER — Emergency Department (HOSPITAL_COMMUNITY): Payer: 59

## 2012-12-06 ENCOUNTER — Inpatient Hospital Stay (HOSPITAL_COMMUNITY): Payer: 59

## 2012-12-06 ENCOUNTER — Encounter (HOSPITAL_COMMUNITY): Payer: Self-pay | Admitting: *Deleted

## 2012-12-06 ENCOUNTER — Observation Stay (HOSPITAL_COMMUNITY)
Admission: EM | Admit: 2012-12-06 | Discharge: 2012-12-07 | Disposition: A | Discharge status: Home / Self Care | Payer: 59 | Attending: Internal Medicine | Admitting: Internal Medicine

## 2012-12-06 DIAGNOSIS — G454 Transient global amnesia: Principal | ICD-10-CM | POA: Diagnosis present

## 2012-12-06 DIAGNOSIS — R4182 Altered mental status, unspecified: Secondary | ICD-10-CM | POA: Insufficient documentation

## 2012-12-06 DIAGNOSIS — Z79899 Other long term (current) drug therapy: Secondary | ICD-10-CM | POA: Insufficient documentation

## 2012-12-06 LAB — CBC
HCT: 43.4 % (ref 39.0–52.0)
Hemoglobin: 15.2 g/dL (ref 13.0–17.0)
MCH: 29.9 pg (ref 26.0–34.0)
MCHC: 35 g/dL (ref 30.0–36.0)
RBC: 5.09 MIL/uL (ref 4.22–5.81)

## 2012-12-06 LAB — COMPREHENSIVE METABOLIC PANEL
Albumin: 4.5 g/dL (ref 3.5–5.2)
Alkaline Phosphatase: 81 U/L (ref 39–117)
BUN: 17 mg/dL (ref 6–23)
Chloride: 107 mEq/L (ref 96–112)
Creatinine, Ser: 1 mg/dL (ref 0.50–1.35)
GFR calc Af Amer: 87 mL/min — ABNORMAL LOW (ref >90)
Glucose, Bld: 99 mg/dL (ref 70–99)
Potassium: 4 mEq/L (ref 3.5–5.1)
Total Bilirubin: 0.5 mg/dL (ref 0.3–1.2)

## 2012-12-06 LAB — DIFFERENTIAL
Basophils Relative: 0 % (ref 0–1)
Eosinophils Absolute: 0.1 10*3/uL (ref 0.0–0.7)
Lymphs Abs: 1.1 10*3/uL (ref 0.7–4.0)
Monocytes Absolute: 0.5 10*3/uL (ref 0.1–1.0)
Monocytes Relative: 7 % (ref 3–12)
Neutro Abs: 5.1 10*3/uL (ref 1.7–7.7)

## 2012-12-06 LAB — RAPID URINE DRUG SCREEN, HOSP PERFORMED
Amphetamines: NOT DETECTED
Opiates: NOT DETECTED

## 2012-12-06 LAB — URINALYSIS, ROUTINE W REFLEX MICROSCOPIC
Glucose, UA: NEGATIVE mg/dL
Leukocytes, UA: NEGATIVE
Protein, ur: NEGATIVE mg/dL
Specific Gravity, Urine: 1.019 (ref 1.005–1.030)
pH: 7 (ref 5.0–8.0)

## 2012-12-06 LAB — POCT I-STAT, CHEM 8
Calcium, Ion: 1.19 mmol/L (ref 1.13–1.30)
Glucose, Bld: 97 mg/dL (ref 70–99)
HCT: 45 % (ref 39.0–52.0)
Hemoglobin: 15.3 g/dL (ref 13.0–17.0)
TCO2: 25 mmol/L (ref 0–100)

## 2012-12-06 LAB — TROPONIN I: Troponin I: <0.3 ng/mL (ref <0.30)

## 2012-12-06 MED ORDER — ASPIRIN 81 MG PO CHEW
81.0000 mg | CHEWABLE_TABLET | Freq: Every day | ORAL | Status: DC
  Filled 2012-12-06 (×3): qty 1

## 2012-12-06 MED ORDER — SIMVASTATIN 20 MG PO TABS
20.0000 mg | ORAL_TABLET | Freq: Every day | ORAL | Status: DC
  Filled 2012-12-06: qty 1

## 2012-12-06 MED ORDER — ACETAMINOPHEN 325 MG PO TABS: 650.0000 mg | ORAL_TABLET | Freq: Four times a day (QID) | ORAL | Status: DC | PRN

## 2012-12-06 MED ORDER — ALBUTEROL SULFATE HFA 108 (90 BASE) MCG/ACT IN AERS
2.0000 | INHALATION_SPRAY | Freq: Four times a day (QID) | RESPIRATORY_TRACT | Status: DC | PRN
  Filled 2012-12-06: qty 6.7

## 2012-12-06 MED ORDER — ASPIRIN 81 MG PO TABS: 81.0000 mg | ORAL_TABLET | Freq: Every day | ORAL | Status: DC

## 2012-12-06 MED ORDER — ENOXAPARIN SODIUM 40 MG/0.4ML ~~LOC~~ SOLN
40.0000 mg | SUBCUTANEOUS | Status: DC
  Administered 2012-12-06: 40 mg via SUBCUTANEOUS
  Filled 2012-12-06 (×2): qty 0.4

## 2012-12-06 MED ORDER — FLUTICASONE PROPIONATE 50 MCG/ACT NA SUSP
1.0000 | Freq: Every day | NASAL | Status: DC | PRN
  Filled 2012-12-06: qty 16

## 2012-12-06 MED ORDER — PANTOPRAZOLE SODIUM 20 MG PO TBEC
20.0000 mg | DELAYED_RELEASE_TABLET | Freq: Every day | ORAL | Status: DC
  Administered 2012-12-06 – 2012-12-07 (×2): 20 mg via ORAL
  Filled 2012-12-06 (×2): qty 1

## 2012-12-06 MED ORDER — ACETAMINOPHEN 650 MG RE SUPP: 650.0000 mg | Freq: Four times a day (QID) | RECTAL | Status: DC | PRN

## 2012-12-06 NOTE — Progress Notes (Signed)
EEG Completed; Results Pending  

## 2012-12-06 NOTE — Consult Note (Signed)
NEURO HOSPITALIST CONSULT NOTE    Reason for Consult: transient amnesia  HPI:                                                                                                                                          [name] is an 67 y.o. male 67 YO male who woke up this AM normal, went to golf course to play golf today and was noted to be acting strange at golf course.  While playing golf he text ed his wife and noted he was having difficulty with memory.  Due to club house noting his strange behavior and not recalling where he was in a cyclical pattern, he was brought to ED.  Neuro was consulted for possible TGA.  In the room he is able to name people around him but has no recollection of why he was brought to ED.  HE is able to hold a conversation but cannot recall parts of exam that were just given to him, such as, naming animals, objects, three things to remember.  During exam he is asking why he is here and what is going on multiple times.   Past Medical History  Diagnosis Date  . History of nephrolithiasis     seen Dr. [facility], last 02/2011  . Hypercholesterolemia   . ED (erectile dysfunction)   . AR (allergic rhinitis)   . Gout     per pt    Past Surgical History  Procedure Laterality Date  . Tonsillectomy and adenoidectomy  1954  . Rt knee arthroscopy  2000  . L thumb joint replace  2005    GRAMIG   . 2d echo  10/11    mild diastolic dysfunction, nl EF   . Total knee arthroplasty  12/2010    right   . Colonoscopy  05/2006    normal, rpt 10 yrs [name], [name])    Family History  Problem Relation Age of Onset  . Lung cancer      family hx  . Stroke      F 1st degree relative <60  . Stroke      M 1st degree relative <50  . Brain cancer      family hx       Social History:  reports that he has never smoked. He has never used smokeless tobacco. He reports that he does not drink alcohol or use illicit drugs.  Allergies  Allergen  Reactions  . Prednisone Rash    MEDICATIONS:  No current facility-administered medications for this encounter.   Current Outpatient Prescriptions  Medication Sig Dispense Refill  . albuterol (PROVENTIL HFA;VENTOLIN HFA) 108 (90 BASE) MCG/ACT inhaler Inhale 2 puffs into the lungs every 6 (six) hours as needed for wheezing.  1 Inhaler  2  . aspirin 81 MG tablet Take 81 mg by mouth daily.        . fluticasone (FLONASE) 50 MCG/ACT nasal spray USE 2 SPRAYS IN EACH NOSTRIL EVERY DAY AS DIRECTED  16 g  3  . ibuprofen (ADVIL,MOTRIN) 200 MG tablet Take 200 mg by mouth every 6 (six) hours as needed.        . lansoprazole (PREVACID) 15 MG capsule Take 15 mg by mouth daily as needed.       . naproxen (NAPROSYN) 500 MG tablet Take one po bid x 1 week then prn pain, take with food  60 tablet  0  . simvastatin (ZOCOR) 40 MG tablet TAKE 1/2 TABLET BY MOUTH EVERY DAY  15 tablet  3  . VIAGRA 100 MG tablet TAKE 1 TABLET BY MOUTH AS NEEDED PRIOR TO INTERCOURSE  10 tablet  3     ROS:                                                                                                                                       History obtained from the patient  General ROS: negative for - chills, fatigue, fever, night sweats, weight gain or weight loss Psychological ROS: negative for - behavioral disorder, hallucinations, memory difficulties, mood swings or suicidal ideation Ophthalmic ROS: negative for - blurry vision, double vision, eye pain or loss of vision ENT ROS: negative for - epistaxis, nasal discharge, oral lesions, sore throat, tinnitus or vertigo Allergy and Immunology ROS: negative for - hives or itchy/watery eyes Hematological and Lymphatic ROS: negative for - bleeding problems, bruising or swollen lymph nodes Endocrine ROS: negative for - galactorrhea, hair pattern changes,  polydipsia/polyuria or temperature intolerance Respiratory ROS: negative for - cough, hemoptysis, shortness of breath or wheezing Cardiovascular ROS: negative for - chest pain, dyspnea on exertion, edema or irregular heartbeat Gastrointestinal ROS: negative for - abdominal pain, diarrhea, hematemesis, nausea/vomiting or stool incontinence Genito-Urinary ROS: negative for - dysuria, hematuria, incontinence or urinary frequency/urgency Musculoskeletal ROS: negative for - joint swelling or muscular weakness Neurological ROS: as noted in HPI Dermatological ROS: negative for rash and skin lesion changes   Blood pressure 136/114, pulse 65, temperature 98.4 F (36.9 C), temperature source Oral, resp. rate 18, height 5\' 7"  (1.702 m), weight 97.523 kg (215 lb), SpO2 97.00%.   Neurologic Examination:  Mental Status: Alert, oriented to hospital but cannot recall why.  Speech fluent without evidence of aphasia.  Able to follow 3 step commands without difficulty. Cannot recall recent parts of exam he just took part in or three objects he was to remember.  He is able to spell [id number] backward and calculate without difficulty.  Cranial Nerves: II: Discs flat bilaterally; Visual fields grossly normal, pupils equal, round, reactive to light and accommodation III,IV, VI: ptosis not present, extra-ocular motions intact bilaterally V,VII: smile symmetric, facial light touch sensation normal bilaterally VIII: hearing normal bilaterally IX,X: gag reflex present XI: bilateral shoulder shrug XII: midline tongue extension Motor: Right : Upper extremity   5/5    Left:     Upper extremity   5/5  Lower extremity   5/5     Lower extremity   5/5 Tone and bulk:normal tone throughout; no atrophy noted Sensory: Pinprick and light touch intact throughout, bilaterally Deep Tendon Reflexes:  Right: Upper Extremity    Left: Upper extremity   biceps (C-5 to C-6) 2/4   biceps (C-5 to C-6) 2/4 tricep (C7) 2/4    triceps (C7) 2/4 Brachioradialis (C6) 2/4  Brachioradialis (C6) 2/4  Lower Extremity Lower Extremity  quadriceps (L-2 to L-4) 2/4   quadriceps (L-2 to L-4) 2/4 Achilles (S1) 2/4   Achilles (S1) 2/4  Plantars: Right: downgoing   Left: downgoing Cerebellar: normal finger-to-nose,  normal heel-to-shin test  CV: pulses palpable throughout    Lab Results  Component Value Date/Time   CHOL 147 06/16/2012  8:20 AM    Results for orders placed during the hospital encounter of 12/06/12 (from the past 48 hour(s))  ETHANOL     Status: None   Collection Time    12/06/12  1:19 PM      Result Value Range   Alcohol, Ethyl (B) <11  0 - 11 mg/dL   Comment:            LOWEST DETECTABLE LIMIT FOR     SERUM ALCOHOL IS 11 mg/dL     FOR MEDICAL PURPOSES ONLY  PROTIME-INR     Status: None   Collection Time    12/06/12  1:19 PM      Result Value Range   Prothrombin Time 12.7  11.6 - 15.2 seconds   INR 0.97  0.00 - 1.49  APTT     Status: None   Collection Time    12/06/12  1:19 PM      Result Value Range   aPTT 28  24 - 37 seconds  CBC     Status: None   Collection Time    12/06/12  1:19 PM      Result Value Range   WBC 6.8  4.0 - 10.5 K/uL   RBC 5.09  4.22 - 5.81 MIL/uL   Hemoglobin 15.2  13.0 - 17.0 g/dL   HCT 40.9  81.1 - 91.4 %   MCV 85.3  78.0 - 100.0 fL   MCH 29.9  26.0 - 34.0 pg   MCHC 35.0  30.0 - 36.0 g/dL   RDW 78.2  95.6 - 21.3 %   Platelets 283  150 - 400 K/uL  DIFFERENTIAL     Status: None   Collection Time    12/06/12  1:19 PM      Result Value Range   Neutrophils Relative % 75  43 - 77 %   Neutro Abs 5.1  1.7 - 7.7 K/uL   Lymphocytes Relative 17  12 - 46 %   Lymphs Abs 1.1  0.7 - 4.0 K/uL   Monocytes Relative 7  3 - 12 %   Monocytes Absolute 0.5  0.1 - 1.0 K/uL   Eosinophils Relative 1  0 - 5 %   Eosinophils Absolute 0.1  0.0 - 0.7 K/uL   Basophils Relative 0  0 - 1  %   Basophils Absolute 0.0  0.0 - 0.1 K/uL  COMPREHENSIVE METABOLIC PANEL     Status: Abnormal   Collection Time    12/06/12  1:19 PM      Result Value Range   Sodium 144  135 - 145 mEq/L   Potassium 4.0  3.5 - 5.1 mEq/L   Chloride 107  96 - 112 mEq/L   CO2 27  19 - 32 mEq/L   Glucose, Bld 99  70 - 99 mg/dL   BUN 17  6 - 23 mg/dL   Creatinine, Ser 1.61  0.50 - 1.35 mg/dL   Calcium 9.7  8.4 - 09.6 mg/dL   Total Protein 8.0  6.0 - 8.3 g/dL   Albumin 4.5  3.5 - 5.2 g/dL   AST 23  0 - 37 U/L   ALT 19  0 - 53 U/L   Alkaline Phosphatase 81  39 - 117 U/L   Total Bilirubin 0.5  0.3 - 1.2 mg/dL   GFR calc non Af Amer 75 (*) >90 mL/min   GFR calc Af Amer 87 (*) >90 mL/min   Comment:            The eGFR has been calculated     using the CKD EPI equation.     This calculation has not been     validated in all clinical     situations.     eGFR's persistently     <90 mL/min signify     possible Chronic Kidney Disease.  TROPONIN I     Status: None   Collection Time    12/06/12  1:19 PM      Result Value Range   Troponin I <0.30  <0.30 ng/mL   Comment:            Due to the release kinetics of cTnI,     a negative result within the first hours     of the onset of symptoms does not rule out     myocardial infarction with certainty.     If myocardial infarction is still suspected,     repeat the test at appropriate intervals.  GLUCOSE, CAPILLARY     Status: None   Collection Time    12/06/12  2:17 PM      Result Value Range   Glucose-Capillary 98  70 - 99 mg/dL  POCT I-STAT, CHEM 8     Status: None   Collection Time    12/06/12  2:27 PM      Result Value Range   Sodium 145  135 - 145 mEq/L   Potassium 3.9  3.5 - 5.1 mEq/L   Chloride 108  96 - 112 mEq/L   BUN 17  6 - 23 mg/dL   Creatinine, Ser 0.45  0.50 - 1.35 mg/dL   Glucose, Bld 97  70 - 99 mg/dL   Calcium, Ion 4.09  8.11 - 1.30 mmol/L   TCO2 25  0 - 100 mmol/L   Hemoglobin 15.3  13.0 - 17.0 g/dL   HCT 91.4  78.2 - 95.6 %   POCT I-STAT  TROPONIN I     Status: None   Collection Time    12/06/12  2:36 PM      Result Value Range   Troponin i, poc 0.00  0.00 - 0.08 ng/mL   Comment 3            Comment: Due to the release kinetics of cTnI,     a negative result within the first hours     of the onset of symptoms does not rule out     myocardial infarction with certainty.     If myocardial infarction is still suspected,     repeat the test at appropriate intervals.    Mr Brain Wo Contrast  12/06/2012   *RADIOLOGY REPORT*  Clinical Data: Transient global amnesia.  MRI HEAD WITHOUT CONTRAST  Technique:  Multiplanar, multiecho pulse sequences of the brain and surrounding structures were obtained according to standard protocol without intravenous contrast.  Comparison: None.  Findings: No acute stroke or hemorrhage.  No mass lesion or hydrocephalus.  No extra-axial fluid.  Mild atrophy.  No significant white matter disease.  No foci of chronic hemorrhage.  No midline shift.  Normal pituitary and cerebellar tonsils.  Mega cisterna magna slightly elevates the torcula.  Mild cervical spondylosis. Mild pannus.  No osseous findings. Mild chronic sinus disease.  IMPRESSION: No acute intracranial findings.  Mild age related atrophy. Incidental mega cisterna magna, normal variant.   Original Report Authenticated By: Davonna Belling, M.D.     Assessment/Plan:  67 YO male presenting with what is likely transient global amnesia.  MRI brain (-). Though less likely, an EEG would be helpful to rule out seizure predisposition. If the patient was not improve, then further workup will be necessary at that time.  Recommend:  1) EEG 2) observe for improvement   Assessment and plan discussed with with attending physician and they are in agreement.    Felicie Morn PA-C [facility] [phone]  12/06/2012, 4:53 PM   I have seen and evaluated the patient. I have reviewed the above note and made appropriate changes.  67 year old male with signs and symptoms classical for transient global amnesia. An EEG to rule out seizure predisposition would be helpful. If he is back to baseline tomorrow, then no further workup would be needed.  Ritta Slot, MD [facility] [phone]  If 7pm- 7am, please page neurology on call at [phone].

## 2012-12-06 NOTE — ED Provider Notes (Signed)
History    CSN: [id number] Arrival date & time 12/06/12  1255  First MD Initiated Contact with Patient 12/06/12 1259     Chief Complaint  Patient presents with  . Altered Mental Status   (Consider location/radiation/quality/duration/timing/severity/associated sxs/prior Treatment) HPI Comments: 67 year old male with a history of high cholesterol who presents with a complaint of altered metal status. Level V caveat applies the that the patient has no memory of this morning.  According to a friend who was golfing with him, at approximately 8:30 he had difficulty forming new memories, continued to ask the same questions over and over and over. He has no complaints, on exam the patient continues to ask the same question which is "what am I doing here". He has had no difficulty walking, talking or swallowing, he has no history of stroke, no history of heart disease and no history of diabetes. The paramedics noted that his blood sugar was 102, vital signs were otherwise normal.  Patient is a 67 y.o. male presenting with altered mental status. The history is provided by the patient, the spouse, a friend and the EMS personnel.  Altered Mental Status Associated symptoms: no abdominal pain, no fever, no headaches, no nausea, no rash, no vomiting and no weakness    Past Medical History  Diagnosis Date  . History of nephrolithiasis     seen Dr. [facility], last 02/2011  . Hypercholesterolemia   . ED (erectile dysfunction)   . AR (allergic rhinitis)   . Gout     per pt   Past Surgical History  Procedure Laterality Date  . Tonsillectomy and adenoidectomy  1954  . Rt knee arthroscopy  2000  . L thumb joint replace  2005    GRAMIG   . 2d echo  10/11    mild diastolic dysfunction, nl EF   . Total knee arthroplasty  12/2010    right   . Colonoscopy  05/2006    normal, rpt 10 yrs [name], [place])   Family History  Problem Relation Age of Onset  . Lung cancer      family hx  . Stroke       F 1st degree relative <60  . Stroke      M 1st degree relative <50  . Brain cancer      family hx    History  Substance Use Topics  . Smoking status: Never Smoker   . Smokeless tobacco: Never Used  . Alcohol Use: No    Review of Systems  Constitutional: Negative for fever and chills.  HENT: Negative for sore throat and neck pain.   Eyes: Negative for visual disturbance.  Respiratory: Negative for cough and shortness of breath.   Cardiovascular: Negative for chest pain.  Gastrointestinal: Negative for nausea, vomiting, abdominal pain and diarrhea.  Genitourinary: Negative for dysuria and frequency.  Musculoskeletal: Negative for back pain.  Skin: Negative for rash.  Neurological: Negative for weakness, numbness and headaches.  Hematological: Negative for adenopathy.  Psychiatric/Behavioral: Positive for altered mental status. Negative for behavioral problems.    Allergies  Prednisone  Home Medications   Current Outpatient Rx  Name  Route  Sig  Dispense  Refill  . albuterol (PROVENTIL HFA;VENTOLIN HFA) 108 (90 BASE) MCG/ACT inhaler   Inhalation   Inhale 2 puffs into the lungs every 6 (six) hours as needed for wheezing.   1 Inhaler   2     ventolin   . aspirin 81 MG tablet   Oral  Take 81 mg by mouth daily.           . fluticasone (FLONASE) 50 MCG/ACT nasal spray      USE 2 SPRAYS IN EACH NOSTRIL EVERY DAY AS DIRECTED   16 g   3   . ibuprofen (ADVIL,MOTRIN) 200 MG tablet   Oral   Take 200 mg by mouth every 6 (six) hours as needed.           . lansoprazole (PREVACID) 15 MG capsule   Oral   Take 15 mg by mouth daily as needed.          . naproxen (NAPROSYN) 500 MG tablet      Take one po bid x 1 week then prn pain, take with food   60 tablet   0   . simvastatin (ZOCOR) 40 MG tablet      TAKE 1/2 TABLET BY MOUTH EVERY DAY   15 tablet   3   . VIAGRA 100 MG tablet      TAKE 1 TABLET BY MOUTH AS NEEDED PRIOR TO INTERCOURSE   10 tablet    3    BP 164/95  Pulse 78  Temp(Src) 98.8 F (37.1 C) (Oral)  Resp 20  Ht 5\' 7"  (1.702 m)  Wt 215 lb (97.523 kg)  BMI 33.67 kg/m2  SpO2 94% Physical Exam  Nursing note and vitals reviewed. Constitutional: He appears well-developed and well-nourished. No distress.  HENT:  Head: Normocephalic and atraumatic.  Mouth/Throat: Oropharynx is clear and moist. No oropharyngeal exudate.  Eyes: Conjunctivae and EOM are normal. Pupils are equal, round, and reactive to light. Right eye exhibits no discharge. Left eye exhibits no discharge. No scleral icterus.  Neck: Normal range of motion. Neck supple. No JVD present. No thyromegaly present.  Cardiovascular: Normal rate, regular rhythm, normal heart sounds and intact distal pulses.  Exam reveals no gallop and no friction rub.   No murmur heard. Pulmonary/Chest: Effort normal and breath sounds normal. No respiratory distress. He has no wheezes. He has no rales.  Abdominal: Soft. Bowel sounds are normal. He exhibits no distension and no mass. There is no tenderness.  Musculoskeletal: Normal range of motion. He exhibits no edema and no tenderness.  Lymphadenopathy:    He has no cervical adenopathy.  Neurological: He is alert. Coordination normal.  Speech is clear, cranial nerves III through XII are intact, memory is abnormal - he is unable to tell me 3 items that I ask him to remember 30 seconds after I asked him. He continues to ask why am I here, where am I., strength is normal in all 4 extremities including grips, sensation is intact to light touch and pinprick in all 4 extremities. Coordination as tested by finger-nose-finger is normal, no limb ataxia. Normal gait, normal reflexes at the patellar tendons bilaterally  Skin: Skin is warm and dry. No rash noted. No erythema.  Psychiatric: He has a normal mood and affect. His behavior is normal.    ED Course  Procedures (including critical care time) Labs Reviewed  COMPREHENSIVE METABOLIC PANEL -  Abnormal; Notable for the following:    GFR calc non Af Amer 75 (*)    GFR calc Af Amer 87 (*)    All other components within normal limits  ETHANOL  PROTIME-INR  APTT  CBC  DIFFERENTIAL  TROPONIN I  GLUCOSE, CAPILLARY  URINE RAPID DRUG SCREEN (HOSP PERFORMED)  URINALYSIS, ROUTINE W REFLEX MICROSCOPIC  POCT I-STAT, CHEM 8  POCT I-STAT  TROPONIN I   No results found. 1. Transient global amnesia     MDM  The patient has focal neurologic deficit of memory loss only, otherwise his exam is normal, vital signs are unremarkable. I discussed his care with neurology will see the patient immediately, I do not think that this is an acute stroke at this time.  He is approximately 5 hours from onset of symptoms on arrival.  ED ECG REPORT  I personally interpreted this EKG   Date: 12/06/2012   Rate: 83  Rhythm: normal sinus rhythm  QRS Axis: normal  Intervals: normal  ST/T Wave abnormalities: normal  Conduction Disutrbances:none  Narrative Interpretation:   Old EKG Reviewed: none available  Neuro has seen pt - they agree that this is likely transient global amnesia, they have recommended MRI and EEG and admission to the hospitalist service.   He has stable memory loss during stay - no worsening of condition.  3:00 PM, discussed with internal medicine service, Dr. Manson Passey who will come to see the patient for admission.  Vida Roller, MD 12/06/12 [phone]

## 2012-12-06 NOTE — Procedures (Signed)
History: Transient global amnesia  Background: There is a well defined posterior dominant rhythm of 13 Hz that attenuates with eye opening. The background consists of intermixed alpha and beta activities. There is an increase in delta associated with drowsiness. Normal distribution sleep structures are observed  Photic stimulation: Physiologic driving is present  EEG Abnormalities: None  Clinical Interpretation: This normal EEG is recorded in the waking and sleep state. There was no seizure or seizure predisposition recorded on this study.   Ritta Slot, MD [facility] [phone]  If 7pm- 7am, please page neurology on call at [phone].

## 2012-12-06 NOTE — H&P (Signed)
Date: 12/06/2012               Patient Name:  [name] MRN: [id number]  DOB: 03/06/1946 Age / Sex: 67 y.o., male   PCP: Richard Boyden, MD         Medical Service: Internal Medicine Teaching Service         Attending Physician: Dr. Rocco Serene, MD    First Contact: Dr. Aundria Pena Pager: [phone]  Second Contact: Dr. Manson Pena  Pager: [phone]       After Hours (After 5p/  First Contact Pager: [phone]  weekends / holidays): Second Contact Pager: [phone]   Chief Complaint: memory loss  History of Present Illness:  [name] is a 67 y/o man with history of HL, nephrolithiasis who presents with memory loss from approximately 9a-1p today.  Pt states that he was in his USOH this morning when he went to bible study at [employer].  Around 9am after he had returned home and called his wife to tell her he was having trouble remembering things he had done in the last few minutes.  However, before she got home to check on him, he had left for the golf course where he had arranged to play golf with a friend.  At the golf course, his friend noted that the pt was not acting like himself and was repeatedly asking the same questions.  Pt's friend called EMS who found the pt to have normal vitals and a BG of 102.  Pt was brought to the ED where he remained confused and did not understand where he was or why he was there.  However, later in the afternoon during our interview, pt was aware of the sequence of events that had brought him to the hospital and was insightful about his situation.  His wife and son also stated that he seemed much improved and back baseline.    Pt did not have syncope, denies prodrome including dizziness, nausea, diaphoresis.  No seizure-like activity including loss of continence to urine or stool. Pt has never had an episode similar to this in the past. No personal history of stroke, heart disease, diabetes.  Of note, pt has family history of CVA in his mother before age 71  and CVA in his father before age 33.   Pt does not take any CNS-active medications, states he has had no change in PO intake and has been drinking plenty of fluids.  Pt specifically denies use of sleeping aids including benadryl, pain medications, illicit drugs.  Pt also reports no smoking, no EtOH.   Pt does state that he went to the dentist last week for consultation about a tooth implant but the tooth in question was thought to be infected.  He was prescribed a course of clindamycin which he has been taking as prescribed but has never had any fever, tooth tenderness, or discharge.   Pt denies fever, lightheadedness, weakness/numbness, chest pain, shortness of breath, nausea/vomiting, abdominal pain.   Meds: No current facility-administered medications for this encounter.    Allergies: Allergies as of 12/06/2012 - Review Complete 12/06/2012  Allergen Reaction Noted  . Prednisone Rash 06/21/2012   Past Medical History  Diagnosis Date  . History of nephrolithiasis     seen Dr. [facility], last 02/2011  . Hypercholesterolemia   . ED (erectile dysfunction)   . AR (allergic rhinitis)   . Gout     per pt   Past Surgical History  Procedure Laterality Date  .  Tonsillectomy and adenoidectomy  1954  . Rt knee arthroscopy  2000  . L thumb joint replace  2005    [name]   . 2d echo  10/11    mild diastolic dysfunction, nl EF   . Total knee arthroplasty  12/2010    right   . Colonoscopy  05/2006    normal, rpt 10 yrs [name], [name])   Family History  Problem Relation Age of Onset  . Lung cancer      family hx  . Stroke      F 1st degree relative <60  . Stroke      M 1st degree relative <50  . Brain cancer      family hx    History   Social History  . Marital Status: Married    Spouse Name: N/A    Number of Children: N/A  . Years of Education: N/A   Occupational History  . Not on file.   Social History Main Topics  . Smoking status: Never Smoker   . Smokeless  tobacco: Never Used  . Alcohol Use: No  . Drug Use: No  . Sexually Active: Not on file   Other Topics Concern  . Not on file   Social History Narrative   Caffeine: occaisonally, 2 diet cokes/day   Married, lives with wife   Retired: Nutritional therapist   Activity: gets regular exercise - bike, elliptical, golfing    Review of Systems:  General: no fevers, chills, changes in weight, changes in appetite Skin: no rash HEENT: no blurry vision, hearing changes, sore throat Pulm: no dyspnea, coughing, wheezing CV: no chest pain, palpitations, shortness of breath Abd: no abdominal pain, nausea/vomiting, diarrhea/constipation GU: no dysuria, hematuria, polyuria Ext: Pt has some chronic upper left thigh pain and numbness; no arthralgias, myalgias Neuro: no weakness, numbness, or tingling  Physical Exam: Blood pressure 137/70, pulse 68, temperature 98.4 F (36.9 C), temperature source Oral, resp. rate 13, height 5\' 7"  (1.702 m), weight 215 lb (97.523 kg), SpO2 96.00%. General: alert, cooperative, and in no apparent distress HEENT: pupils equal round and reactive to light, vision grossly intact, oropharynx clear and non-erythematous  Neck: supple, no lymphadenopathy, JVD, or carotid bruits Lungs: clear to ascultation bilaterally, normal work of respiration, no wheezes, rales, ronchi Heart: regular rate and rhythm, no murmurs, gallops, or rubs Abdomen: soft, non-tender, non-distended, normal bowel sounds Extremities: 2+ DP/PT pulses bilaterally, no cyanosis, clubbing, or edema Neurologic: thorough neuro exam done with no defects; pt alert & oriented X3, fluent speech without aphasia, cranial nerves II-XII intact, strength 5/5 throughout, sensation intact to light touch fluent, normal FTN MMSE 27/30- pt able to complete all tasks without difficulty except for memory of 3 objects after ~30 seconds; able to follow 3 step commands, able to do serial 7s, spell world backward without difficulty, etc.   Lab  results: Basic Metabolic Panel:  Recent Labs  40/98/11 1319 12/06/12 1427  NA 144 145  K 4.0 3.9  CL 107 108  CO2 27  --   GLUCOSE 99 97  BUN 17 17  CREATININE 1.00 1.10  CALCIUM 9.7  --    Liver Function Tests:  Recent Labs  12/06/12 1319  AST 23  ALT 19  ALKPHOS 81  BILITOT 0.5  PROT 8.0  ALBUMIN 4.5   CBC:  Recent Labs  12/06/12 1319 12/06/12 1427  WBC 6.8  --   NEUTROABS 5.1  --   HGB 15.2 15.3  HCT 43.4 45.0  MCV 85.3  --   PLT 283  --    Cardiac Enzymes:  Recent Labs  12/06/12 1319  TROPONINI <0.30   CBG:  Recent Labs  12/06/12 1417  GLUCAP 98   Coagulation:  Recent Labs  12/06/12 1319  LABPROT 12.7  INR 0.97     Alcohol Level:  Recent Labs  12/06/12 1319  ETH <11   Imaging results:  Mr Brain Wo Contrast  12/06/2012   *RADIOLOGY REPORT*  Clinical Data: Transient global amnesia.  MRI HEAD WITHOUT CONTRAST  Technique:  Multiplanar, multiecho pulse sequences of the brain and surrounding structures were obtained according to standard protocol without intravenous contrast.  Comparison: None.  Findings: No acute stroke or hemorrhage.  No mass lesion or hydrocephalus.  No extra-axial fluid.  Mild atrophy.  No significant white matter disease.  No foci of chronic hemorrhage.  No midline shift.  Normal pituitary and cerebellar tonsils.  Mega cisterna magna slightly elevates the torcula.  Mild cervical spondylosis. Mild pannus.  No osseous findings. Mild chronic sinus disease.  IMPRESSION: No acute intracranial findings.  Mild age related atrophy. Incidental mega cisterna magna, normal variant.   Original Report Authenticated By: Davonna Belling, M.D.    Other results: EKG: sinus rhythm, normal axis, no ST changes  Assessment & Plan by Problem: Mr. Rivadeneira is a 67 y/o man with history of HL, nephrolithiasis who presents with memory loss for a period of approximately 4 hours this morning, likely transient global amnesia per neuro.   #Transient  global amnesia- Pt had period of approximately 4 hours starting at around 9am during which time he was unable to remember what had happened during that period.  Pt remained very confused in the ED though he seems to be starting to remember some of the events from that period this afternoon.  MRI brain with no acute intracranial findings. Pt not on any CNS active medications, has not taken any new medications in the recent past (other than clindamycin for his presumed tooth infection).  Pt also does not drink alcohol or do illicit drugs an has no evidence of infection (afebrile, negative ROS). Neuro note states that this is likely transient global amnesia (TGN), and pt fits typical demographic (middle to older aged men).  However, we are ruling out other possible etiologies of his amnesia including infections, drugs, and stroke.  -neuro following, appreciate recs -EEG per neuro rec -CXR -TSH -UA, urine culture -UDS -observe for 24 hours with neuro checks  Dispo: Disposition is deferred at this time, awaiting improvement of current medical problems. Anticipated discharge in approximately 1-2 day(s).   The patient does have a current PCP Richard Boyden, MD) and does need an [facility] hospital follow-up appointment after discharge.  The patient does not have transportation limitations that hinder transportation to clinic appointments.  Signed: Rocco Serene, MD 12/06/2012, 6:59 PM

## 2012-12-06 NOTE — ED Notes (Signed)
Per [place] with repetitive questioning. No trauma noted. No facial droop, grips equal, no slurred speech. Pupils pinpoint, equal, reactive. Symptoms noticed between 0830 and 0900 per friend. CBG 102. IV 20G R Hand. VSS.

## 2012-12-07 LAB — CBC
Platelets: 249 10*3/uL (ref 150–400)
RBC: 4.82 MIL/uL (ref 4.22–5.81)
RDW: 13.5 % (ref 11.5–15.5)
WBC: 5 10*3/uL (ref 4.0–10.5)

## 2012-12-07 LAB — TSH: TSH: 2.295 u[IU]/mL (ref 0.350–4.500)

## 2012-12-07 LAB — BASIC METABOLIC PANEL
BUN: 17 mg/dL (ref 6–23)
CO2: 25 mEq/L (ref 19–32)
Chloride: 104 mEq/L (ref 96–112)
GFR calc non Af Amer: 74 mL/min — ABNORMAL LOW (ref >90)
Glucose, Bld: 100 mg/dL — ABNORMAL HIGH (ref 70–99)
Potassium: 3.5 mEq/L (ref 3.5–5.1)

## 2012-12-07 MED ORDER — ACETAMINOPHEN 325 MG PO TABS
650.0000 mg | ORAL_TABLET | Freq: Once | ORAL | Status: AC
  Administered 2012-12-07: 650 mg via ORAL
  Filled 2012-12-07: qty 2

## 2012-12-07 NOTE — H&P (Signed)
I saw and evaluated the patient. I reviewed the resident's note and confirmed the resident's findings.  I agree with the assessment and plan as documented in the resident's note.  Briefly, [name] is a 67 year old man with a history of nephrolithiasis and hyperlipidemia who does not remember the period from 9 AM to 1 PM on the day of admission. Workup for alternative neurologic explanations including the physical exam, EEG, and MRI have been negative. He has no signs of infection and has not taken any medications that would affect his CNS. Therefore we are left with the diagnosis of exclusion of transient global amnesia. This morning on rounds, he felt he was much improved and at baseline. His wife corroborates this statement. No further evaluation is necessary and I agree with the housestaff's plan to discharge him home today given the good prognosis and unlikely chance that this will be repeated in the future.

## 2012-12-07 NOTE — Progress Notes (Signed)
Subjective: Much improved overnight  Exam: Filed Vitals:   12/07/12 1030  BP: 137/72  Pulse: 64  Temp: 97.4 F (36.3 C)  Resp: 20   Gen: In bed, NAD MS: Awake, alert, remember some of the events that happened prior to the onset of his TGA that he did not remember yesterday CN: Pupils equal round and reactive, extraocular movements intact Motor: 5/5 throughout Sensory: Intact to light touch   Impression: 67 year old male with transient global amnesia. With normal MRI and EEG I feel no further workup is necessary. With his improvement, I feel that he would be safe to discharge home. No further recommendations at this time. The prognosis for TGA is very good, and recurrent episodes are rare  Recommendations: 1) no further recommendations, neurology will sign off at this time.  2) please call with any further questions  Ritta Slot, MD [facility] [phone]  If 7pm- 7am, please page neurology on call at [phone].

## 2012-12-07 NOTE — Progress Notes (Signed)
   CARE MANAGEMENT NOTE 12/07/2012  Patient:  [name], [name]   Account Number:  [id number]  Date Initiated:  12/07/2012  Documentation initiated by:  Jiles Crocker  Subjective/Objective Assessment:   ADMITTED WITH ALTERED MENTAL STATUS/ Transient global amnesia     Action/Plan:   PCP Eustaquio Boyden, MD  LIVES AT HOME WITH SPOUSE; INDEPENDENT PRIOR TO ADMISSION; CM FOLLOWING FOR DCP   Anticipated DC Date:  12/09/2012   Anticipated DC Plan:  HOME/SELF CARE      DC Planning Services  CM consult           Status of service:  In process, will continue to follow Medicare Important Message given?  NA - LOS <3 / Initial given by admissions (If response is "NO", the following Medicare IM given date fields will be blank)  Per UR Regulation:  Reviewed for med. necessity/level of care/duration of stay Comments:  12/07/2012- B Myleigh Amara RN,BSN,MHA

## 2012-12-07 NOTE — Progress Notes (Signed)
Subjective: Mr. Sloane is doing well this morning, no further episodes of amnesia. He also now   remembers parts of the 4 hour window during which he was unable to form new memories yesterday.  No complaints this morning, ready to go home.   Objective: Vital signs in last 24 hours: Filed Vitals:   12/07/12 0154 12/07/12 0554 12/07/12 0744 12/07/12 1030  BP: 127/80 120/67 123/74 137/72  Pulse: 61 60 67 64  Temp: 97.8 F (36.6 C) 97.9 F (36.6 C) 98.5 F (36.9 C) 97.4 F (36.3 C)  TempSrc: Oral Oral Oral Oral  Resp: 18 20 18 20   Height:      Weight:      SpO2: 97% 97% 99% 98%   PEX General: alert, cooperative, and in no apparent distress HEENT: NCAT, vision grossly intact Lungs: clear to ascultation bilaterally, normal work of respiration, no wheezes, rales, ronchi Heart: regular rate and rhythm, no murmurs, gallops, or rubs Abdomen: soft, non-tender, non-distended, normal bowel sounds Extremities: no cyanosis, clubbing, or edema Neurologic: alert & oriented X3, cranial nerves II-XII intact, strength intact, sensation intact to light touch, normal FTN  Lab Results: Basic Metabolic Panel:  Recent Labs Lab 12/06/12 1319 12/06/12 1427 12/07/12 0530  NA 144 145 140  K 4.0 3.9 3.5  CL 107 108 104  CO2 27  --  25  GLUCOSE 99 97 100*  BUN 17 17 17   CREATININE 1.00 1.10 1.01  CALCIUM 9.7  --  9.4   Liver Function Tests:  Recent Labs Lab 12/06/12 1319  AST 23  ALT 19  ALKPHOS 81  BILITOT 0.5  PROT 8.0  ALBUMIN 4.5   CBC:  Recent Labs Lab 12/06/12 1319 12/06/12 1427 12/07/12 0530  WBC 6.8  --  5.0  NEUTROABS 5.1  --   --   HGB 15.2 15.3 14.1  HCT 43.4 45.0 41.7  MCV 85.3  --  86.5  PLT 283  --  249   Cardiac Enzymes:  Recent Labs Lab 12/06/12 1319  TROPONINI <0.30   CBG:  Recent Labs Lab 12/06/12 1417  GLUCAP 98   Coagulation:  Recent Labs Lab 12/06/12 1319  LABPROT 12.7  INR 0.97   Urine Drug Screen: Drugs of Abuse       Component Value Date/Time   LABOPIA NONE DETECTED 12/06/2012 1425   COCAINSCRNUR NONE DETECTED 12/06/2012 1425   LABBENZ NONE DETECTED 12/06/2012 1425   AMPHETMU NONE DETECTED 12/06/2012 1425   THCU NONE DETECTED 12/06/2012 1425   LABBARB NONE DETECTED 12/06/2012 1425    Alcohol Level:  Recent Labs Lab 12/06/12 1319  ETH <11   Urinalysis:  Recent Labs Lab 12/06/12 1425  COLORURINE YELLOW  LABSPEC 1.019  PHURINE 7.0  GLUCOSEU NEGATIVE  HGBUR TRACE*  BILIRUBINUR NEGATIVE  KETONESUR NEGATIVE  PROTEINUR NEGATIVE  UROBILINOGEN 0.2  NITRITE NEGATIVE  LEUKOCYTESUR NEGATIVE    Studies/Results: X-ray Chest Pa And Lateral   12/07/2012   *RADIOLOGY REPORT*  Clinical Data: Confusion; disoriented.  CHEST - 2 VIEW  Comparison: Chest radiograph performed 02/21/2010  Findings: The lungs are well-aerated.  Minimal left midlung zone airspace opacity may reflect scarring or possibly an overlying bone island.  There is no evidence of pleural effusion or pneumothorax.  The heart is borderline normal in size; the mediastinal contour is within normal limits.  No acute osseous abnormalities are seen.  IMPRESSION: Minimal left midlung zone airspace opacity may reflect scarring; lungs otherwise grossly clear.   Original Report Authenticated By:  Tonia Ghent, M.D.   Mr Brain Wo Contrast  12/06/2012   *RADIOLOGY REPORT*  Clinical Data: Transient global amnesia.  MRI HEAD WITHOUT CONTRAST  Technique:  Multiplanar, multiecho pulse sequences of the brain and surrounding structures were obtained according to standard protocol without intravenous contrast.  Comparison: None.  Findings: No acute stroke or hemorrhage.  No mass lesion or hydrocephalus.  No extra-axial fluid.  Mild atrophy.  No significant white matter disease.  No foci of chronic hemorrhage.  No midline shift.  Normal pituitary and cerebellar tonsils.  Mega cisterna magna slightly elevates the torcula.  Mild cervical spondylosis. Mild pannus.  No  osseous findings. Mild chronic sinus disease.  IMPRESSION: No acute intracranial findings.  Mild age related atrophy. Incidental mega cisterna magna, normal variant.   Original Report Authenticated By: Davonna Belling, M.D.   Medications: I have reviewed the patient's current medications. Scheduled Meds: . aspirin  81 mg Oral Daily  . enoxaparin (LOVENOX) injection  40 mg Subcutaneous Q24H  . pantoprazole  20 mg Oral Daily  . simvastatin  20 mg Oral q1800   PRN Meds:.albuterol, fluticasone  Assessment/Plan: Mr. Ess is a 67 y/o man with history of HL, nephrolithiasis who presents with anterograde memory loss for a period of approximately 4 hours on morning on 7/14, likely transient global amnesia.   #Transient global amnesia- MRI brain with no acute intracranial findings. Pt denies any CNS active medications, including anti-histamines. Pt also does not drink alcohol or do illicit drugs (EtOH level in ED <11, UDS all negative). Pt has no evidence of infection (afebrile, negative ROS). Also, EEG negative, clear CXR, TSH within normal limits, UA negative, BMP within normal limits, BG 70-146 since admission.  Given that all other etiologies have been ruled out (metabolic disturbances, stroke, seizure, drugs, infection), left with diagnosis of exclusion of transient global amnesia.   #DVT PPX- lovenox   Dispo: Disposition today.   The patient does have a current PCP Eustaquio Boyden, MD) and does need an [facility] hospital follow-up appointment after discharge.  The patient does not have transportation limitations that hinder transportation to clinic appointments.  .Services Needed at time of discharge: Y = Yes, Blank = No PT:   OT:   RN:   Equipment:   Other:     LOS: 1 day   Rocco Serene, MD 12/07/2012, 12:01 PM

## 2012-12-07 NOTE — Discharge Summary (Signed)
Name: [name] MRN: [id number] DOB: 1946/01/11 67 y.o. PCP: Eustaquio Boyden, MD  Date of Admission: 12/06/2012 12:55 PM Date of Discharge: 12/07/2012 Attending Physician: Rocco Serene, MD  Discharge Diagnosis: Transient global amnesia   Discharge Medications:   Medication List         albuterol 108 (90 BASE) MCG/ACT inhaler  Commonly known as:  PROVENTIL HFA;VENTOLIN HFA  Inhale 2 puffs into the lungs every 6 (six) hours as needed for wheezing.     aspirin 81 MG tablet  Take 81 mg by mouth daily.     clindamycin 150 MG capsule  Commonly known as:  CLEOCIN  Take 150 mg by mouth every 6 (six) hours. For 2 weeks     fluticasone 50 MCG/ACT nasal spray  Commonly known as:  FLONASE  USE 2 SPRAYS IN EACH NOSTRIL EVERY DAY AS DIRECTED     ibuprofen 200 MG tablet  Commonly known as:  ADVIL,MOTRIN  Take 200 mg by mouth every 6 (six) hours as needed.     lansoprazole 15 MG capsule  Commonly known as:  PREVACID  Take 15 mg by mouth daily as needed.     simvastatin 40 MG tablet  Commonly known as:  ZOCOR  TAKE 1/2 TABLET BY MOUTH EVERY DAY     VIAGRA 100 MG tablet  Generic drug:  sildenafil  TAKE 1 TABLET BY MOUTH AS NEEDED PRIOR TO INTERCOURSE        Disposition and follow-up:   [name] was discharged from [facility] in Stable condition.  At the hospital follow up visit please address:  1.  Verify no further episodes of amnesia, mental status at baseline   2.  Labs / imaging needed at time of follow-up: none  3.  Pending labs/ test needing follow-up: none  Follow-up Appointments:     Follow-up Information   Follow up with Eustaquio Boyden, MD On 12/13/2012. (3:45pm (please arrive at 3:30))    Contact information:   [address] [place] [address] [phone]       Discharge Instructions: Discharge Orders   Future Appointments Provider Department Dept Phone   12/13/2012 3:45 PM Eustaquio Boyden,  MD [facility] [place] [phone]   06/14/2013 8:45 AM [facility] [place] [phone]   06/21/2013 10:30 AM Eustaquio Boyden, MD [facility] [phone]   Future Orders Complete By Expires     Activity as tolerated - No restrictions  As directed     Call MD for:  persistant dizziness or light-headedness  As directed     Diet - low sodium heart healthy  As directed        Consultations:  neurology  Procedures Performed:  X-ray Chest Pa And Lateral   12/07/2012   *RADIOLOGY REPORT*  Clinical Data: Confusion; disoriented.  CHEST - 2 VIEW  Comparison: Chest radiograph performed 02/21/2010  Findings: The lungs are well-aerated.  Minimal left midlung zone airspace opacity may reflect scarring or possibly an overlying bone island.  There is no evidence of pleural effusion or pneumothorax.  The heart is borderline normal in size; the mediastinal contour is within normal limits.  No acute osseous abnormalities are seen.  IMPRESSION: Minimal left midlung zone airspace opacity may reflect scarring; lungs otherwise grossly clear.   Original Report Authenticated By: Tonia Ghent, M.D.   Dg Femur Left  11/23/2012   *RADIOLOGY REPORT*  Clinical Data: Left quadriceps strain  LEFT FEMUR - 2 VIEW  Comparison: None.  Findings: Four views of the left femur submitted.  No acute fracture or subluxation.  No periosteal reaction or bony erosion.  IMPRESSION: No acute fracture or subluxation.   Original Report Authenticated By: Natasha Mead, M.D.   Mr Brain Wo Contrast  12/06/2012   *RADIOLOGY REPORT*  Clinical Data: Transient global amnesia.  MRI HEAD WITHOUT CONTRAST  Technique:  Multiplanar, multiecho pulse sequences of the brain and surrounding structures were obtained according to standard protocol without intravenous contrast.  Comparison: None.  Findings: No acute stroke or hemorrhage.  No mass lesion or hydrocephalus.  No extra-axial fluid.  Mild  atrophy.  No significant white matter disease.  No foci of chronic hemorrhage.  No midline shift.  Normal pituitary and cerebellar tonsils.  Mega cisterna magna slightly elevates the torcula.  Mild cervical spondylosis. Mild pannus.  No osseous findings. Mild chronic sinus disease.  IMPRESSION: No acute intracranial findings.  Mild age related atrophy. Incidental mega cisterna magna, normal variant.   Original Report Authenticated By: Davonna Belling, M.D.   Admission HPI:  [name] is a 67 y/o man with history of HL, nephrolithiasis who presents with memory loss from approximately 9a-1p today. Pt states that he was in his USOH this morning when he went to bible study at [employer]. Around 9am after he had returned home and called his wife to tell her he was having trouble remembering things he had done in the last few minutes. However, before she got home to check on him, he had left for the golf course where he had arranged to play golf with a friend. At the golf course, his friend noted that the pt was not acting like himself and was repeatedly asking the same questions. Pt's friend called EMS who found the pt to have normal vitals and a BG of 102. Pt was brought to the ED where he remained confused and did not understand where he was or why he was there. However, later in the afternoon during our interview, pt was aware of the sequence of events that had brought him to the hospital and was insightful about his situation. His wife and son also stated that he seemed much improved and back baseline.  Pt did not have syncope, denies prodrome including dizziness, nausea, diaphoresis. No seizure-like activity including loss of continence to urine or stool. Pt has never had an episode similar to this in the past. No personal history of stroke, heart disease, diabetes. Of note, pt has family history of CVA in his mother before age 72 and CVA in his father before age 61.  Pt does not take any CNS-active medications,  states he has had no change in PO intake and has been drinking plenty of fluids. Pt specifically denies use of sleeping aids including benadryl, pain medications, illicit drugs. Pt also reports no smoking, no EtOH.  Pt does state that he went to the dentist last week for consultation about a tooth implant but the tooth in question was thought to be infected. He was prescribed a course of clindamycin which he has been taking as prescribed but has never had any fever, tooth tenderness, or discharge.  Pt denies fever, lightheadedness, weakness/numbness, chest pain, shortness of breath, nausea/vomiting, abdominal pain.   Hospital Course by problem list: Principal Problem:   Transient global amnesia Pt was questioned and had many tests done (outlined below) to rule out other etiologies of his amnesia, including metabolic disturbances, stroke, seizure,  drugs, infection.  All tests were negative, left with diagnosis of exclusion of transient global amnesia.  MRI brain 7/14 with no acute intracranial findings. EEG negative, clear CXR, TSH within normal limits, UA negative, BMP within normal limits, BG 70-146 since admission.  Pt has no evidence of infection (afebrile, negative ROS, no white count). Pt denies any CNS active medications, including anti-histamines.  Pt reports no EtOH, no illicit drugs (EtOH level in ED <11, UDS all negative).   Fortunately, TGA prognosis is very good and recurrent episodes are rare.   Discharge Vitals:   BP 137/72  Pulse 64  Temp(Src) 97.4 F (36.3 C) (Oral)  Resp 20  Ht 5\' 7"  (1.702 m)  Wt 215 lb (97.523 kg)  BMI 33.67 kg/m2  SpO2 98%  Discharge Labs:  Results for orders placed during the hospital encounter of 12/06/12 (from the past 24 hour(s))  ETHANOL     Status: None   Collection Time    12/06/12  1:19 PM      Result Value Range   Alcohol, Ethyl (B) <11  0 - 11 mg/dL  PROTIME-INR     Status: None   Collection Time    12/06/12  1:19 PM      Result Value  Range   Prothrombin Time 12.7  11.6 - 15.2 seconds   INR 0.97  0.00 - 1.49  APTT     Status: None   Collection Time    12/06/12  1:19 PM      Result Value Range   aPTT 28  24 - 37 seconds  CBC     Status: None   Collection Time    12/06/12  1:19 PM      Result Value Range   WBC 6.8  4.0 - 10.5 K/uL   RBC 5.09  4.22 - 5.81 MIL/uL   Hemoglobin 15.2  13.0 - 17.0 g/dL   HCT 09.8  11.9 - 14.7 %   MCV 85.3  78.0 - 100.0 fL   MCH 29.9  26.0 - 34.0 pg   MCHC 35.0  30.0 - 36.0 g/dL   RDW 82.9  56.2 - 13.0 %   Platelets 283  150 - 400 K/uL  DIFFERENTIAL     Status: None   Collection Time    12/06/12  1:19 PM      Result Value Range   Neutrophils Relative % 75  43 - 77 %   Neutro Abs 5.1  1.7 - 7.7 K/uL   Lymphocytes Relative 17  12 - 46 %   Lymphs Abs 1.1  0.7 - 4.0 K/uL   Monocytes Relative 7  3 - 12 %   Monocytes Absolute 0.5  0.1 - 1.0 K/uL   Eosinophils Relative 1  0 - 5 %   Eosinophils Absolute 0.1  0.0 - 0.7 K/uL   Basophils Relative 0  0 - 1 %   Basophils Absolute 0.0  0.0 - 0.1 K/uL  COMPREHENSIVE METABOLIC PANEL     Status: Abnormal   Collection Time    12/06/12  1:19 PM      Result Value Range   Sodium 144  135 - 145 mEq/L   Potassium 4.0  3.5 - 5.1 mEq/L   Chloride 107  96 - 112 mEq/L   CO2 27  19 - 32 mEq/L   Glucose, Bld 99  70 - 99 mg/dL   BUN 17  6 - 23 mg/dL   Creatinine, Ser 8.65  0.50 -  1.35 mg/dL   Calcium 9.7  8.4 - 78.2 mg/dL   Total Protein 8.0  6.0 - 8.3 g/dL   Albumin 4.5  3.5 - 5.2 g/dL   AST 23  0 - 37 U/L   ALT 19  0 - 53 U/L   Alkaline Phosphatase 81  39 - 117 U/L   Total Bilirubin 0.5  0.3 - 1.2 mg/dL   GFR calc non Af Amer 75 (*) >90 mL/min   GFR calc Af Amer 87 (*) >90 mL/min  TROPONIN I     Status: None   Collection Time    12/06/12  1:19 PM      Result Value Range   Troponin I <0.30  <0.30 ng/mL  GLUCOSE, CAPILLARY     Status: None   Collection Time    12/06/12  2:17 PM      Result Value Range   Glucose-Capillary 98  70 - 99 mg/dL   URINE RAPID DRUG SCREEN (HOSP PERFORMED)     Status: None   Collection Time    12/06/12  2:25 PM      Result Value Range   Opiates NONE DETECTED  NONE DETECTED   Cocaine NONE DETECTED  NONE DETECTED   Benzodiazepines NONE DETECTED  NONE DETECTED   Amphetamines NONE DETECTED  NONE DETECTED   Tetrahydrocannabinol NONE DETECTED  NONE DETECTED   Barbiturates NONE DETECTED  NONE DETECTED  URINALYSIS, ROUTINE W REFLEX MICROSCOPIC     Status: Abnormal   Collection Time    12/06/12  2:25 PM      Result Value Range   Color, Urine YELLOW  YELLOW   APPearance CLEAR  CLEAR   Specific Gravity, Urine 1.019  1.005 - 1.030   pH 7.0  5.0 - 8.0   Glucose, UA NEGATIVE  NEGATIVE mg/dL   Hgb urine dipstick TRACE (*) NEGATIVE   Bilirubin Urine NEGATIVE  NEGATIVE   Ketones, ur NEGATIVE  NEGATIVE mg/dL   Protein, ur NEGATIVE  NEGATIVE mg/dL   Urobilinogen, UA 0.2  0.0 - 1.0 mg/dL   Nitrite NEGATIVE  NEGATIVE   Leukocytes, UA NEGATIVE  NEGATIVE  URINE MICROSCOPIC-ADD ON     Status: None   Collection Time    12/06/12  2:25 PM      Result Value Range   Squamous Epithelial / LPF RARE  RARE   RBC / HPF 3-6  <3 RBC/hpf  POCT I-STAT, CHEM 8     Status: None   Collection Time    12/06/12  2:27 PM      Result Value Range   Sodium 145  135 - 145 mEq/L   Potassium 3.9  3.5 - 5.1 mEq/L   Chloride 108  96 - 112 mEq/L   BUN 17  6 - 23 mg/dL   Creatinine, Ser 9.56  0.50 - 1.35 mg/dL   Glucose, Bld 97  70 - 99 mg/dL   Calcium, Ion 2.13  0.86 - 1.30 mmol/L   TCO2 25  0 - 100 mmol/L   Hemoglobin 15.3  13.0 - 17.0 g/dL   HCT 57.8  46.9 - 62.9 %  POCT I-STAT TROPONIN I     Status: None   Collection Time    12/06/12  2:36 PM      Result Value Range   Troponin i, poc 0.00  0.00 - 0.08 ng/mL   Comment 3           BASIC METABOLIC PANEL     Status:  Abnormal   Collection Time    12/07/12  5:30 AM      Result Value Range   Sodium 140  135 - 145 mEq/L   Potassium 3.5  3.5 - 5.1 mEq/L   Chloride 104  96 - 112  mEq/L   CO2 25  19 - 32 mEq/L   Glucose, Bld 100 (*) 70 - 99 mg/dL   BUN 17  6 - 23 mg/dL   Creatinine, Ser 1.61  0.50 - 1.35 mg/dL   Calcium 9.4  8.4 - 09.6 mg/dL   GFR calc non Af Amer 74 (*) >90 mL/min   GFR calc Af Amer 86 (*) >90 mL/min  CBC     Status: None   Collection Time    12/07/12  5:30 AM      Result Value Range   WBC 5.0  4.0 - 10.5 K/uL   RBC 4.82  4.22 - 5.81 MIL/uL   Hemoglobin 14.1  13.0 - 17.0 g/dL   HCT 04.5  40.9 - 81.1 %   MCV 86.5  78.0 - 100.0 fL   MCH 29.3  26.0 - 34.0 pg   MCHC 33.8  30.0 - 36.0 g/dL   RDW 91.4  78.2 - 95.6 %   Platelets 249  150 - 400 K/uL    Signed: Rocco Serene, MD 12/07/2012, 12:02 PM   Time Spent on Discharge: 35 minutes Services Ordered on Discharge: none Equipment Ordered on Discharge: none

## 2012-12-07 NOTE — Progress Notes (Signed)
Patient refused 81mg  aspirin. States he is to have dental work and the dentist told him to "stay away" from aspirin for now. Patient educated on aspirin and its use. Still refused.

## 2012-12-08 ENCOUNTER — Telehealth: Payer: Self-pay | Admitting: Family Medicine

## 2012-12-08 NOTE — Telephone Encounter (Signed)
Pt admitted and discharged after episode of transient global amnesia, now fully resolved.  W/u unrevealing.   plz call pt for update on how he's feeling post discharge and ensure no further memory loss episodes. plz verify appt date. Thanks.

## 2012-12-13 ENCOUNTER — Ambulatory Visit (INDEPENDENT_AMBULATORY_CARE_PROVIDER_SITE_OTHER): Payer: 59 | Admitting: Family Medicine

## 2012-12-13 ENCOUNTER — Encounter: Payer: Self-pay | Admitting: Family Medicine

## 2012-12-13 VITALS — BP 128/78 | HR 80 | Temp 98.0°F | Wt 218.2 lb

## 2012-12-13 DIAGNOSIS — S76102A Unspecified injury of left quadriceps muscle, fascia and tendon, initial encounter: Secondary | ICD-10-CM

## 2012-12-13 DIAGNOSIS — S79919A Unspecified injury of unspecified hip, initial encounter: Secondary | ICD-10-CM

## 2012-12-13 DIAGNOSIS — G454 Transient global amnesia: Secondary | ICD-10-CM

## 2012-12-13 DIAGNOSIS — S79929A Unspecified injury of unspecified thigh, initial encounter: Secondary | ICD-10-CM

## 2012-12-13 NOTE — Progress Notes (Signed)
Subjective:    Patient ID: [name], male    DOB: 1945-07-27, 67 y.o.   MRN: [id number]  HPI CC: f/u hospitalization  [name] presents today as hospital f/u for recent admission 7/14-15/2014 for episode of transient global amnesia.  Workup including MRI, EEG and UDS was all normal and unrevealing.  Feels crackles in left ear.  Also feels some trouble focusing after bending down to floor. No further memory problems since discharge from hospital. Occasional headache above right eye since amnesia episode - HA are rare for him.  No vision changes.  Has seen ortho Dr. Antony Odea for concern for quad tear - pending MRI. Completing cleocin course for upcoming dental work.  Date of Admission: 12/06/2012 12:55 PM  Date of Discharge: 12/07/2012  Attending Physician: Rocco Serene, MD  Discharge Diagnosis:  Transient global amnesia  Discharge Medications:    Medication List         albuterol 108 (90 BASE) MCG/ACT inhaler    Commonly known as: PROVENTIL HFA;VENTOLIN HFA    Inhale 2 puffs into the lungs every 6 (six) hours as needed for wheezing.    aspirin 81 MG tablet    Take 81 mg by mouth daily.    clindamycin 150 MG capsule    Commonly known as: CLEOCIN    Take 150 mg by mouth every 6 (six) hours. For 2 weeks    fluticasone 50 MCG/ACT nasal spray    Commonly known as: FLONASE    USE 2 SPRAYS IN EACH NOSTRIL EVERY DAY AS DIRECTED    ibuprofen 200 MG tablet    Commonly known as: ADVIL,MOTRIN    Take 200 mg by mouth every 6 (six) hours as needed.    lansoprazole 15 MG capsule    Commonly known as: PREVACID    Take 15 mg by mouth daily as needed.    simvastatin 40 MG tablet    Commonly known as: ZOCOR    TAKE 1/2 TABLET BY MOUTH EVERY DAY    VIAGRA 100 MG tablet    Generic drug: sildenafil    TAKE 1 TABLET BY MOUTH AS NEEDED PRIOR TO INTERCOURSE     Disposition and follow-up:  [name] was discharged from [facility] in Stable  condition. At the hospital follow up visit please address:  1. Verify no further episodes of amnesia, mental status at baseline  2. Labs / imaging needed at time of follow-up: none  3. Pending labs/ test needing follow-up: none   Consultations: neurology  Procedures Performed:  X-ray Chest Pa And Lateral  12/07/2012 *RADIOLOGY REPORT* Clinical Data: Confusion; disoriented. CHEST - 2 VIEW Comparison: Chest radiograph performed 02/21/2010 Findings: The lungs are well-aerated. Minimal left midlung zone airspace opacity may reflect scarring or possibly an overlying bone island. There is no evidence of pleural effusion or pneumothorax. The heart is borderline normal in size; the mediastinal contour is within normal limits. No acute osseous abnormalities are seen. IMPRESSION: Minimal left midlung zone airspace opacity may reflect scarring; lungs otherwise grossly clear. Original Report Authenticated By: Tonia Ghent, M.D.  Dg Femur Left  11/23/2012 *RADIOLOGY REPORT* Clinical Data: Left quadriceps strain LEFT FEMUR - 2 VIEW Comparison: None. Findings: Four views of the left femur submitted. No acute fracture or subluxation. No periosteal reaction or bony erosion. IMPRESSION: No acute fracture or subluxation. Original Report Authenticated By: Natasha Mead, M.D.  Mr Brain Wo Contrast  12/06/2012 *RADIOLOGY REPORT* Clinical Data: Transient global amnesia. MRI HEAD WITHOUT  CONTRAST Technique: Multiplanar, multiecho pulse sequences of the brain and surrounding structures were obtained according to standard protocol without intravenous contrast. Comparison: None. Findings: No acute stroke or hemorrhage. No mass lesion or hydrocephalus. No extra-axial fluid. Mild atrophy. No significant white matter disease. No foci of chronic hemorrhage. No midline shift. Normal pituitary and cerebellar tonsils. Mega cisterna magna slightly elevates the torcula. Mild cervical spondylosis. Mild pannus. No osseous findings. Mild chronic  sinus disease. IMPRESSION: No acute intracranial findings. Mild age related atrophy. Incidental mega cisterna magna, normal variant. Original Report Authenticated By: Davonna Belling, M.D.  Admission HPI:  [name] is a 67 y/o man with history of HL, nephrolithiasis who presents with memory loss from approximately 9a-1p today. Pt states that he was in his USOH this morning when he went to bible study at [employer]. Around 9am after he had returned home and called his wife to tell her he was having trouble remembering things he had done in the last few minutes. However, before she got home to check on him, he had left for the golf course where he had arranged to play golf with a friend. At the golf course, his friend noted that the pt was not acting like himself and was repeatedly asking the same questions. Pt's friend called EMS who found the pt to have normal vitals and a BG of 102. Pt was brought to the ED where he remained confused and did not understand where he was or why he was there. However, later in the afternoon during our interview, pt was aware of the sequence of events that had brought him to the hospital and was insightful about his situation. His wife and son also stated that he seemed much improved and back baseline.  Pt did not have syncope, denies prodrome including dizziness, nausea, diaphoresis. No seizure-like activity including loss of continence to urine or stool. Pt has never had an episode similar to this in the past. No personal history of stroke, heart disease, diabetes. Of note, pt has family history of CVA in his mother before age 12 and CVA in his father before age 74.  Pt does not take any CNS-active medications, states he has had no change in PO intake and has been drinking plenty of fluids. Pt specifically denies use of sleeping aids including benadryl, pain medications, illicit drugs. Pt also reports no smoking, no EtOH.  Pt does state that he went to the dentist last week for  consultation about a tooth implant but the tooth in question was thought to be infected. He was prescribed a course of clindamycin which he has been taking as prescribed but has never had any fever, tooth tenderness, or discharge.  Pt denies fever, lightheadedness, weakness/numbness, chest pain, shortness of breath, nausea/vomiting, abdominal pain.  Hospital Course by problem list:  Principal Problem:  Transient global amnesia  Pt was questioned and had many tests done (outlined below) to rule out other etiologies of his amnesia, including metabolic disturbances, stroke, seizure, drugs, infection. All tests were negative, left with diagnosis of exclusion of transient global amnesia.  MRI brain 7/14 with no acute intracranial findings. EEG negative, clear CXR, TSH within normal limits, UA negative, BMP within normal limits, BG 70-146 since admission.  Pt has no evidence of infection (afebrile, negative ROS, no white count).  Pt denies any CNS active medications, including anti-histamines.  Pt reports no EtOH, no illicit drugs (EtOH level in ED <11, UDS all negative).  Fortunately, TGA prognosis is very good  and recurrent episodes are rare.    Past Medical History  Diagnosis Date  . History of nephrolithiasis     seen Dr. [facility], last 02/2011  . Hypercholesterolemia   . ED (erectile dysfunction)   . AR (allergic rhinitis)   . Gout     per pt      Review of Systems Per HPI    Objective:   Physical Exam  Nursing note and vitals reviewed. Constitutional: He is oriented to person, place, and time. He appears well-developed and well-nourished. No distress.  HENT:  Head: Normocephalic and atraumatic.  Mouth/Throat: Oropharynx is clear and moist. No oropharyngeal exudate.  Eyes: Conjunctivae and EOM are normal. Pupils are equal, round, and reactive to light. No scleral icterus.  Neck: Normal range of motion. Neck supple.  Cardiovascular: Normal rate, regular rhythm, normal heart  sounds and intact distal pulses.   No murmur heard. Pulmonary/Chest: Effort normal and breath sounds normal. No respiratory distress. He has no wheezes. He has no rales.  Musculoskeletal: He exhibits no edema.  Neurological: He is alert and oriented to person, place, and time. He has normal strength. No cranial nerve deficit or sensory deficit. He displays a negative Romberg sign.  CN 2-12 intact. Normal FTN No pronator drift       Assessment & Plan:

## 2012-12-13 NOTE — Patient Instructions (Signed)
Good to see you today, call us with questions. Everything looking well today.

## 2012-12-13 NOTE — Assessment & Plan Note (Signed)
Pending f/u with ortho.

## 2012-12-13 NOTE — Assessment & Plan Note (Signed)
Resolving well.  Will continue to monitor for now. Pt aware this is not likely to happen again.   Questions answered.

## 2012-12-21 NOTE — Telephone Encounter (Signed)
Pt seen

## 2013-02-22 ENCOUNTER — Telehealth: Payer: Self-pay | Admitting: Family Medicine

## 2013-02-22 NOTE — Telephone Encounter (Signed)
Message copied by Eustaquio Boyden on Tue Feb 22, 2013  6:44 AM ------      Message from: Dala Dock R      Created: Mon Feb 21, 2013  3:12 PM       [name] is coming this Wednesday, 02/23/13 for his flu shot and wants to know if it is ok for him to get the pneumonia vaccine as well? He does not want to get it with his flu shot and knows he has to wait 30 days before he can receive the pneumonia vacc.             Thanks,      Revonda Standard ------

## 2013-02-22 NOTE — Telephone Encounter (Signed)
plz notify pt - our records show he did receive pneumonia shot 05/2010, if that is the case he currently doesn't need another (however in the future that may change as guidelines may be changing, but we will discuss at future visit.)

## 2013-02-22 NOTE — Telephone Encounter (Signed)
Message left notifying patient.

## 2013-02-23 ENCOUNTER — Ambulatory Visit (INDEPENDENT_AMBULATORY_CARE_PROVIDER_SITE_OTHER): Payer: 59

## 2013-02-23 DIAGNOSIS — Z23 Encounter for immunization: Secondary | ICD-10-CM

## 2013-03-07 ENCOUNTER — Other Ambulatory Visit: Payer: Self-pay | Admitting: Family Medicine

## 2013-06-11 ENCOUNTER — Other Ambulatory Visit: Payer: Self-pay | Admitting: Family Medicine

## 2013-06-11 DIAGNOSIS — E7800 Pure hypercholesterolemia, unspecified: Secondary | ICD-10-CM

## 2013-06-11 DIAGNOSIS — R7301 Impaired fasting glucose: Secondary | ICD-10-CM

## 2013-06-11 DIAGNOSIS — Z125 Encounter for screening for malignant neoplasm of prostate: Secondary | ICD-10-CM

## 2013-06-14 ENCOUNTER — Other Ambulatory Visit (INDEPENDENT_AMBULATORY_CARE_PROVIDER_SITE_OTHER): Payer: 59

## 2013-06-14 DIAGNOSIS — R7301 Impaired fasting glucose: Secondary | ICD-10-CM

## 2013-06-14 DIAGNOSIS — E7800 Pure hypercholesterolemia, unspecified: Secondary | ICD-10-CM

## 2013-06-14 DIAGNOSIS — Z125 Encounter for screening for malignant neoplasm of prostate: Secondary | ICD-10-CM

## 2013-06-14 LAB — BASIC METABOLIC PANEL
BUN: 15 mg/dL (ref 6–23)
CALCIUM: 9.5 mg/dL (ref 8.4–10.5)
CO2: 27 mEq/L (ref 19–32)
Chloride: 106 mEq/L (ref 96–112)
Creatinine, Ser: 1 mg/dL (ref 0.4–1.5)
GFR: 77.03 mL/min (ref >60.00)
Glucose, Bld: 99 mg/dL (ref 70–99)
POTASSIUM: 4.3 meq/L (ref 3.5–5.1)
Sodium: 141 mEq/L (ref 135–145)

## 2013-06-14 LAB — LIPID PANEL
CHOL/HDL RATIO: 3
Cholesterol: 136 mg/dL (ref 0–200)
HDL: 39.5 mg/dL (ref >39.00)
LDL CALC: 78 mg/dL (ref 0–99)
Triglycerides: 95 mg/dL (ref 0.0–149.0)
VLDL: 19 mg/dL (ref 0.0–40.0)

## 2013-06-14 LAB — PSA: PSA: 0.6 ng/mL (ref 0.10–4.00)

## 2013-06-21 ENCOUNTER — Ambulatory Visit (INDEPENDENT_AMBULATORY_CARE_PROVIDER_SITE_OTHER): Payer: 59 | Admitting: Family Medicine

## 2013-06-21 ENCOUNTER — Encounter: Payer: Self-pay | Admitting: Family Medicine

## 2013-06-21 VITALS — BP 130/88 | HR 68 | Temp 98.9°F | Ht 67.5 in | Wt 210.5 lb

## 2013-06-21 DIAGNOSIS — E7800 Pure hypercholesterolemia, unspecified: Secondary | ICD-10-CM

## 2013-06-21 DIAGNOSIS — J309 Allergic rhinitis, unspecified: Secondary | ICD-10-CM

## 2013-06-21 DIAGNOSIS — M79605 Pain in left leg: Secondary | ICD-10-CM

## 2013-06-21 DIAGNOSIS — G454 Transient global amnesia: Secondary | ICD-10-CM

## 2013-06-21 DIAGNOSIS — M79609 Pain in unspecified limb: Secondary | ICD-10-CM

## 2013-06-21 DIAGNOSIS — Z0000 Encounter for general adult medical examination without abnormal findings: Secondary | ICD-10-CM

## 2013-06-21 NOTE — Assessment & Plan Note (Signed)
Endorses mild persistent short term memory issues - but testing today normal.   3/3 recall, 4/5 serial 7s.

## 2013-06-21 NOTE — Assessment & Plan Note (Signed)
Chronic, good control on simvastatin 20mg  daily.

## 2013-06-21 NOTE — Patient Instructions (Addendum)
Let us know when you'd like the shingles shot. Update your living will and bring me a copy to update your chart. Good to see you today, call us with questions. Return as needed or in 1 year for next wellness exam.

## 2013-06-21 NOTE — Assessment & Plan Note (Signed)
I have personally reviewed the Medicare Annual Wellness questionnaire and have noted 1. The patient's medical and social history 2. Their use of alcohol, tobacco or illicit drugs 3. Their current medications and supplements 4. The patient's functional ability including ADL's, fall risks, home safety risks and hearing or visual impairment. 5. Diet and physical activity 6. Evidence for depression or mood disorders The patients weight, height, BMI have been recorded in the chart.  Hearing and vision has been addressed. I have made referrals, counseling and provided education to the patient based review of the above and I have provided the pt with a written personalized care plan for preventive services. See scanned questionairre. Advanced directives discussed: son is HC proxy.  Pt will update his living will and bring [place] a copy.  Reviewed preventative protocols and updated unless pt declined. Pt will call us when desires zostavax.

## 2013-06-21 NOTE — Progress Notes (Signed)
Subjective:    Patient ID: [name], male    DOB: February 10, 1946, 68 y.o.   MRN: [id number]  HPI CC: medicare wellness visit  Having some L leg discomfort - ache in lateral calf - happens after walking a certain distance.  seen by ortho - does have knee DJD but medial > lateral.  Using horse linament ointment.  Taking osteo biflex for OA.  Body mass index is 32.46 kg/(m^2).  Wt Readings from Last 3 Encounters:  06/21/13 210 lb 8 oz (95.482 kg)  12/13/12 218 lb 4 oz (98.998 kg)  12/06/12 215 lb (97.523 kg)  Endorses losing 22 lbs in last 6 months.  Walks and runs 3 miles a day.  Started lifting  Preventative:  Colonoscopy 2008 - normal, rpt 10 yrs Amedeo Plenty, [place])  Prostate - yearly. Always normal. Flu 02/2013 Pneumonia 05/2010 - completed  Td 07/2009 Shingles - very expensive - may consider later in year when deductible is met. Advanced directives - son would be HC proxy.  Has living will at home - needs updating.  No falls in last year.  No anhedonia, denies depression  Passes hearing and vision screens today  Caffeine: occaisonally, 2 diet cokes/day Married, lives with wife Retired: Development worker, community Activity: Walks and runs 3 miles a day.  Started lifting weights.  stays active golfing Diet: good water, fruits/vegetables daily, avoiding carbs and watching portion sizes  Medications and allergies reviewed and updated in chart.  Past histories reviewed and updated if relevant as below. Patient Active Problem List   Diagnosis Date Noted  . 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 Unsp injury of left quadriceps musc/fasc/tend, init 11/23/2012  . Quadriceps strain 06/21/2012  . Sinus congestion 10/21/2011  . Healthcare maintenance 06/16/2011  . Urinary stream splitting 06/16/2011  . Impaired fasting blood sugar 06/16/2011  . Kidney stone 03/24/2011  . Constipation 03/24/2011  . DYSPNEA ON EXERTION 02/21/2010  . HYPERCHOLESTEROLEMIA 04/16/2009  . ALLERGIC RHINITIS 04/16/2009    Past Medical History  Diagnosis Date  . History of nephrolithiasis     seen Dr. [facility], last 02/2011  . Hypercholesterolemia   . ED (erectile dysfunction)   . AR (allergic rhinitis)   . Gout     per pt   Past Surgical History  Procedure Laterality Date  . Tonsillectomy and adenoidectomy  1954  . Rt knee arthroscopy  2000  . L thumb joint replace  2005    GRAMIG   . 2d echo  09/73    mild diastolic dysfunction, nl EF   . Total knee arthroplasty  12/2010    right   . Colonoscopy  05/2006    normal, rpt 10 yrs [facility], [place])   History  Substance Use Topics  . Smoking status: Never Smoker   . Smokeless tobacco: Never Used  . Alcohol Use: No   Family History  Problem Relation Age of Onset  . Lung cancer      family hx  . Stroke      F 1st degree relative <60  . Stroke      M 1st degree relative <50  . Brain cancer      family hx    Allergies  Allergen Reactions  . Prednisone Rash   Current Outpatient Prescriptions on File Prior to Visit  Medication Sig Dispense Refill  . albuterol (PROVENTIL HFA;VENTOLIN HFA) 108 (90 BASE) MCG/ACT inhaler Inhale 2 puffs into the lungs every 6 (six) hours as needed for wheezing.  1 Inhaler  2  . aspirin 81 MG tablet Take 81 mg by mouth daily.        . fluticasone (FLONASE) 50 MCG/ACT nasal spray USE 2 SPRAYS IN EACH NOSTRIL EVERY DAY AS DIRECTED  16 g  3  . ibuprofen (ADVIL,MOTRIN) 200 MG tablet Take 200 mg by mouth every 6 (six) hours as needed.        . sildenafil (VIAGRA) 100 MG tablet Take 0.5-1 tablets (50-100 mg total) by mouth as needed (relations).  10 tablet  3  . simvastatin (ZOCOR) 40 MG tablet TAKE 0.5 TABLET BY MOUTH EVERY DAY.  15 tablet  3   No current facility-administered medications on file prior to visit.     Review of Systems  Constitutional: Negative for fever, chills, activity change, appetite change, fatigue and unexpected weight change.  HENT: Negative for hearing loss.   Eyes: Negative for  visual disturbance.  Respiratory: Negative for cough, chest tightness, shortness of breath and wheezing.   Cardiovascular: Negative for chest pain, palpitations and leg swelling.  Gastrointestinal: Negative for nausea, vomiting, abdominal pain, diarrhea, constipation, blood in stool and abdominal distention.  Genitourinary: Negative for hematuria and difficulty urinating.  Musculoskeletal: Negative for arthralgias, myalgias and neck pain.  Skin: Negative for rash.  Neurological: Negative for dizziness, seizures, syncope and headaches.  Hematological: Negative for adenopathy. Does not bruise/bleed easily.  Psychiatric/Behavioral: Negative for dysphoric mood. The patient is not nervous/anxious.        Objective:   Physical Exam  Nursing note and vitals reviewed. Constitutional: He is oriented to person, place, and time. He appears well-developed and well-nourished. No distress.  HENT:  Head: Normocephalic and atraumatic.  Right Ear: Hearing, tympanic membrane, external ear and ear canal normal.  Left Ear: Hearing, tympanic membrane, external ear and ear canal normal.  Nose: Nose normal.  Mouth/Throat: Oropharynx is clear and moist. No oropharyngeal exudate.  Eyes: Conjunctivae and EOM are normal. Pupils are equal, round, and reactive to light. No scleral icterus.  Neck: Normal range of motion. Neck supple. Carotid bruit is not present. No thyromegaly present.  Cardiovascular: Normal rate, regular rhythm, normal heart sounds and intact distal pulses.   No murmur heard. Pulses:      Radial pulses are 2+ on the right side, and 2+ on the left side.       Dorsalis pedis pulses are 2+ on the right side, and 2+ on the left side.  Pulmonary/Chest: Effort normal and breath sounds normal. No respiratory distress. He has no wheezes. He has no rales.  Abdominal: Soft. Bowel sounds are normal. He exhibits no distension and no mass. There is no tenderness. There is no rebound and no guarding.   Genitourinary: Rectum normal and prostate normal. Rectal exam shows no external hemorrhoid, no internal hemorrhoid, no fissure, no mass, no tenderness and anal tone normal. Prostate is not enlarged and not tender.  Musculoskeletal: Normal range of motion. He exhibits no edema.  Lymphadenopathy:    He has no cervical adenopathy.  Neurological: He is alert and oriented to person, place, and time.  CN grossly intact, station and gait intact  Skin: Skin is warm and dry. No rash noted.  Psychiatric: He has a normal mood and affect. His behavior is normal. Judgment and thought content normal.       Assessment & Plan:

## 2013-06-21 NOTE — Assessment & Plan Note (Signed)
Not consistent with claudication, and good 2+ DP bilaterally. Anticipate quad injury related vs possible sciatica. Continue to monitor for now.

## 2013-07-15 ENCOUNTER — Other Ambulatory Visit: Payer: Self-pay | Admitting: Family Medicine

## 2013-07-25 ENCOUNTER — Telehealth: Payer: Self-pay | Admitting: Family Medicine

## 2013-07-25 MED ORDER — ALBUTEROL SULFATE HFA 108 (90 BASE) MCG/ACT IN AERS: 2.0000 | INHALATION_SPRAY | Freq: Four times a day (QID) | RESPIRATORY_TRACT | Status: DC | PRN

## 2013-07-25 MED ORDER — OSELTAMIVIR PHOSPHATE 75 MG PO CAPS: 75.0000 mg | ORAL_CAPSULE | Freq: Two times a day (BID) | ORAL | Status: DC

## 2013-07-25 NOTE — Telephone Encounter (Signed)
Patient Information:  Caller Name: [name]  Phone: [phone]  Patient: [name]  Gender: Male  DOB: 12-30-1945  Age: 68 Years  PCP: Ria Bush [facility])  Office Follow Up:  Does the office need to follow up with this patient?: Yes  Instructions For The Office: appt workin/Rx/callback krs/can  RN Note:  Sudden onset of muscle aches, fever, and cough 07/24/13.  States he cannot get comfortable due to body aches.  States received flu vaccine. States has not had any wheezing; states his prescription has run out on his inhaler.  As patient is > 79 years of age and has fever > 100.5/tactile, disposition See Within 4 Hours.  No appts available in Epic within dispositioned time frame.  Family requesting tamiflu for patient.  Advised needs positive flu testing; family prefers Rx.  Paramedic.  Info to office via Epic note for provider review/Rx/callback.  May reach patient at [phone].  Symptoms  Reason For Call & Symptoms: influenza  Reviewed Health History In EMR: Yes  Reviewed Medications In EMR: Yes  Reviewed Allergies In EMR: Yes  Reviewed Surgeries / Procedures: Yes  Date of Onset of Symptoms: 07/24/2013  Any Fever: Yes  Fever Taken: Tactile  Fever Time Of Reading: 14:30:00  Fever Last Reading: N/A  Guideline(s) Used:  Influenza - Seasonal  Disposition Per Guideline:   Go to Office Now  Reason For Disposition Reached:   Fever > 100.5 F (38.1 C) and over 24 years of age  Advice Given:  N/A  Patient Will Follow Care Advice:  YES

## 2013-07-25 NOTE — Telephone Encounter (Signed)
I just got to this phone note.  I am okay with calling in tamiflu and the inhaler, as long as his PCP agrees. Will route to PCP; we can discuss early AM tomorrow and make arrangements.

## 2013-07-25 NOTE — Telephone Encounter (Signed)
plz notify pt tamiflu sent in.  Offer appt today if desired.

## 2013-07-26 NOTE — Telephone Encounter (Signed)
Patient notified and said he is starting to feel much better. He will call tomorrow if he feels he needs to be seen, but for now he things he is improving.

## 2013-07-27 ENCOUNTER — Telehealth: Payer: Self-pay

## 2013-07-27 ENCOUNTER — Ambulatory Visit: Payer: 59 | Admitting: Internal Medicine

## 2013-07-27 DIAGNOSIS — Z0289 Encounter for other administrative examinations: Secondary | ICD-10-CM

## 2013-07-27 NOTE — Telephone Encounter (Signed)
PA form for Ventolin inhaler in your IN box for completion.

## 2013-07-31 MED ORDER — ALBUTEROL SULFATE HFA 108 (90 BASE) MCG/ACT IN AERS: 2.0000 | INHALATION_SPRAY | Freq: Four times a day (QID) | RESPIRATORY_TRACT | Status: DC | PRN

## 2013-07-31 NOTE — Addendum Note (Signed)
Addended by: Ria Bush on: 07/31/2013 08:34 PM   Modules accepted: Orders, Medications

## 2013-07-31 NOTE — Telephone Encounter (Signed)
plz notify proair sent in instead to use prn as that is med on pt's pharmacy's formulary.

## 2013-08-03 NOTE — Telephone Encounter (Signed)
Patient aware.

## 2013-10-05 ENCOUNTER — Other Ambulatory Visit: Payer: Self-pay | Admitting: Family Medicine

## 2013-12-05 ENCOUNTER — Other Ambulatory Visit: Payer: Self-pay | Admitting: Family Medicine

## 2014-01-13 ENCOUNTER — Telehealth: Payer: Self-pay | Admitting: Family Medicine

## 2014-01-13 NOTE — Telephone Encounter (Signed)
Received clearance request from Dr. Maureen Ralphs for upcoming L TKA.  plz call and schedule OV for preop eval.

## 2014-01-13 NOTE — Telephone Encounter (Signed)
Appt scheduled

## 2014-01-13 NOTE — Telephone Encounter (Signed)
Message left for patient to return my call and schedule 30 minute OV.

## 2014-01-18 ENCOUNTER — Ambulatory Visit (INDEPENDENT_AMBULATORY_CARE_PROVIDER_SITE_OTHER): Payer: 59 | Admitting: Family Medicine

## 2014-01-18 ENCOUNTER — Encounter: Payer: Self-pay | Admitting: Family Medicine

## 2014-01-18 ENCOUNTER — Encounter (INDEPENDENT_AMBULATORY_CARE_PROVIDER_SITE_OTHER): Payer: Self-pay

## 2014-01-18 VITALS — BP 136/82 | HR 68 | Temp 98.0°F | Wt 206.0 lb

## 2014-01-18 DIAGNOSIS — I4510 Unspecified right bundle-branch block: Secondary | ICD-10-CM

## 2014-01-18 DIAGNOSIS — Z01818 Encounter for other preprocedural examination: Secondary | ICD-10-CM

## 2014-01-18 DIAGNOSIS — E7800 Pure hypercholesterolemia, unspecified: Secondary | ICD-10-CM

## 2014-01-18 LAB — BASIC METABOLIC PANEL
BUN: 22 mg/dL (ref 6–23)
CHLORIDE: 103 meq/L (ref 96–112)
CO2: 23 mEq/L (ref 19–32)
CREATININE: 1.1 mg/dL (ref 0.4–1.5)
Calcium: 9.4 mg/dL (ref 8.4–10.5)
GFR: 71.99 mL/min (ref >60.00)
Glucose, Bld: 86 mg/dL (ref 70–99)
Potassium: 3.7 mEq/L (ref 3.5–5.1)
Sodium: 139 mEq/L (ref 135–145)

## 2014-01-18 LAB — CBC WITH DIFFERENTIAL/PLATELET
Basophils Absolute: 0 10*3/uL (ref 0.0–0.1)
Basophils Relative: 0.2 % (ref 0.0–3.0)
EOS ABS: 0.1 10*3/uL (ref 0.0–0.7)
Eosinophils Relative: 1.4 % (ref 0.0–5.0)
HCT: 45 % (ref 39.0–52.0)
HEMOGLOBIN: 15.1 g/dL (ref 13.0–17.0)
Lymphocytes Relative: 22.7 % (ref 12.0–46.0)
Lymphs Abs: 2 10*3/uL (ref 0.7–4.0)
MCHC: 33.6 g/dL (ref 30.0–36.0)
MCV: 91.2 fl (ref 78.0–100.0)
MONO ABS: 0.6 10*3/uL (ref 0.1–1.0)
Monocytes Relative: 6.7 % (ref 3.0–12.0)
NEUTROS ABS: 6 10*3/uL (ref 1.4–7.7)
Neutrophils Relative %: 69 % (ref 43.0–77.0)
Platelets: 319 10*3/uL (ref 150.0–400.0)
RBC: 4.94 Mil/uL (ref 4.22–5.81)
RDW: 13.2 % (ref 11.5–15.5)
WBC: 8.8 10*3/uL (ref 4.0–10.5)

## 2014-01-18 LAB — PROTIME-INR
INR: 1 ratio (ref 0.8–1.0)
PROTHROMBIN TIME: 11 s (ref 9.6–13.1)

## 2014-01-18 MED ORDER — PRAVASTATIN SODIUM 40 MG PO TABS: 40.0000 mg | ORAL_TABLET | Freq: Every day | ORAL | Status: DC

## 2014-01-18 NOTE — Assessment & Plan Note (Addendum)
New finding but should not impede ability to have safe surgery given he has no cardiac symptoms currently.

## 2014-01-18 NOTE — Progress Notes (Signed)
Pre visit review using our clinic review tool, if applicable. No additional management support is needed unless otherwise documented below in the visit note. 

## 2014-01-18 NOTE — Assessment & Plan Note (Signed)
New manufacturer of simvastatin has led to significant myalgias/arthralgias so pt self stopped. Will change simvastatin to pravastatin 40mg  daily. Pt agrees.

## 2014-01-18 NOTE — Patient Instructions (Addendum)
I will send today's note to Dr. Maureen Ralphs. I think we should do well with surgery. I wish you a speedy recovery! Change simvastratin to pravastatin

## 2014-01-18 NOTE — Assessment & Plan Note (Addendum)
Should be adequately low risk to tolerate stressors of upcoming surgery as well as recovery. Good exercise capacity/tolerance as evidenced by continued exercise routine. No concerning cardiac symptoms endorsed. Has tolerated GETA in past well. Recommend albuterol inhaler available give h/o RAD with URIs but likely won't need scheduled use. Will defer aspirin discontinuation to surgeon. EKG: sinus rhythm 60, normal axis, no ST/T changes, RBBB noted new since previous EKG 11/2012.

## 2014-01-18 NOTE — Progress Notes (Addendum)
BP 136/82  Pulse 68  Temp(Src) 98 F (36.7 C) (Oral)  Wt 206 lb (93.441 kg)   CC: preop eval  Subjective:    Patient ID: [name], male    DOB: 1945-08-04, 68 y.o.   MRN: [id number]  HPI: [name] is a 68 y.o. male presenting on 01/18/2014 for Medical Clearance   Pt presents today for preop eval for upcoming left TKA for knee osteoarthritis by Dr. Maureen Ralphs scheduled for 05/08/2014.  Goes to gym regularly 5x/wk 1 hour walks on elliptical, and then walks 3.5 miles a day - no chest pain or dypsnea with this. Also lifts weights.  HLD - off simvastatin 20mg  daily - stopped this 2 months ago. GERD - controlled on prn ppi.  Only drug allergy is prednisone. He has had GETA in the past and tolerated well - last was R knee replacement 2012. H/o RAD with bronchitis or other respiratory infections. Rare albuterol use. Tends to flare in winter months. Never smoker, no EtOH.  Has been advised to hold NSAIDs and ASA 7 d prior to procedure  2D echo Date: 95/62 mild diastolic dysfunction, nl EF   Relevant past medical, surgical, family and social history reviewed and updated as indicated.  Allergies and medications reviewed and updated. Current Outpatient Prescriptions on File Prior to Visit  Medication Sig  . acetaminophen (TYLENOL) 500 MG tablet Take 500 mg by mouth every 6 (six) hours as needed.  Marland Kitchen albuterol (PROAIR HFA) 108 (90 BASE) MCG/ACT inhaler Inhale 2 puffs into the lungs every 6 (six) hours as needed for wheezing or shortness of breath.  Marland Kitchen aspirin 81 MG tablet Take 81 mg by mouth daily.    . fluticasone (FLONASE) 50 MCG/ACT nasal spray USE 2 SPRAYS IN EACH NOSTRIL EVERY DAY AS DIRECTED  . Garcinia Cambogia-Chromium 500-200 MG-MCG TABS Take 3 tablets by mouth daily.  . Glucosamine-Chondroitin (OSTEO BI-FLEX REGULAR STRENGTH PO) Take 1 tablet by mouth daily.  Marland Kitchen ibuprofen (ADVIL,MOTRIN) 200 MG tablet Take 200 mg by mouth every 6 (six) hours as needed.    .  lansoprazole (PREVACID) 15 MG capsule Take 15 mg by mouth daily as needed.  . Multiple Vitamin (MULTIVITAMIN) tablet Take 1 tablet by mouth daily.  . naproxen sodium (ANAPROX) 220 MG tablet Take 220 mg by mouth as needed.  Marland Kitchen VIAGRA 100 MG tablet TAKE 1/2 TO 1 TABLET BY MOUTH AS NEEDED   No current facility-administered medications on file prior to visit.    Review of Systems Per HPI unless specifically indicated above    Objective:    BP 136/82  Pulse 68  Temp(Src) 98 F (36.7 C) (Oral)  Wt 206 lb (93.441 kg)  Physical Exam  Nursing note and vitals reviewed. Constitutional: He appears well-developed and well-nourished. No distress.  HENT:  Mouth/Throat: Oropharynx is clear and moist. No oropharyngeal exudate.  Neck: Carotid bruit is not present.  Cardiovascular: Normal rate, regular rhythm, normal heart sounds and intact distal pulses.   No murmur heard. Pulmonary/Chest: Effort normal and breath sounds normal. No respiratory distress. He has no wheezes. He has no rales.  Musculoskeletal: He exhibits no edema.  Body mass index is 31.77 kg/(m^2).     Assessment & Plan:   Problem List Items Addressed This Visit   HYPERCHOLESTEROLEMIA     New manufacturer of simvastatin has led to significant myalgias/arthralgias so pt self stopped. Will change simvastatin to pravastatin 40mg  daily. Pt agrees.    Relevant Medications  pravastatin (PRAVACHOL) tablet   Preoperative clearance - Primary     Should be adequately low risk to tolerate stressors of upcoming surgery as well as recovery. Good exercise capacity/tolerance as evidenced by continued exercise routine. No concerning cardiac symptoms endorsed. Has tolerated GETA in past well. Recommend albuterol inhaler available give h/o RAD with URIs but likely won't need scheduled use. Will defer aspirin discontinuation to surgeon. EKG: sinus rhythm 60, normal axis, no ST/T changes, RBBB noted new since previous EKG 11/2012.     Relevant Orders      EKG 12-Lead (Completed)      CBC with Differential      Basic metabolic panel      Protime-INR   RBBB     New finding but should not impede ability to have safe surgery given he has no cardiac symptoms currently.    Relevant Medications      pravastatin (PRAVACHOL) tablet       Follow up plan: Return as needed.

## 2014-01-19 ENCOUNTER — Encounter: Payer: Self-pay | Admitting: *Deleted

## 2014-03-22 ENCOUNTER — Ambulatory Visit (INDEPENDENT_AMBULATORY_CARE_PROVIDER_SITE_OTHER): Payer: 59

## 2014-03-22 DIAGNOSIS — Z23 Encounter for immunization: Secondary | ICD-10-CM

## 2014-04-18 ENCOUNTER — Ambulatory Visit: Payer: Self-pay | Admitting: Orthopedic Surgery

## 2014-04-18 NOTE — Progress Notes (Signed)
Preoperative surgical orders have been place into the Epic hospital system for [name] on 04/18/2014, 2:07 PM  by Mickel Crow for surgery on 05-08-2014.  Preop Total Knee orders including Experal, IV Tylenol, and IV Decadron as long as there are no contraindications to the above medications. Richard Muslim, PA-C

## 2014-04-26 ENCOUNTER — Encounter (HOSPITAL_COMMUNITY)
Admission: RE | Admit: 2014-04-26 | Discharge: 2014-04-26 | Disposition: A | Discharge status: Home / Self Care | Payer: 59 | Source: Ambulatory Visit | Attending: Orthopedic Surgery | Admitting: Orthopedic Surgery

## 2014-04-26 ENCOUNTER — Ambulatory Visit (HOSPITAL_COMMUNITY)
Admission: RE | Admit: 2014-04-26 | Discharge: 2014-04-26 | Disposition: A | Discharge status: Home / Self Care | Payer: 59 | Source: Ambulatory Visit | Attending: Anesthesiology | Admitting: Anesthesiology

## 2014-04-26 ENCOUNTER — Encounter (HOSPITAL_COMMUNITY): Payer: Self-pay

## 2014-04-26 DIAGNOSIS — Z01812 Encounter for preprocedural laboratory examination: Secondary | ICD-10-CM | POA: Insufficient documentation

## 2014-04-26 DIAGNOSIS — Z01818 Encounter for other preprocedural examination: Secondary | ICD-10-CM | POA: Diagnosis not present

## 2014-04-26 DIAGNOSIS — Z7901 Long term (current) use of anticoagulants: Secondary | ICD-10-CM | POA: Diagnosis not present

## 2014-04-26 DIAGNOSIS — I4510 Unspecified right bundle-branch block: Secondary | ICD-10-CM

## 2014-04-26 DIAGNOSIS — M1712 Unilateral primary osteoarthritis, left knee: Secondary | ICD-10-CM | POA: Diagnosis not present

## 2014-04-26 DIAGNOSIS — Z01810 Encounter for preprocedural cardiovascular examination: Secondary | ICD-10-CM | POA: Diagnosis not present

## 2014-04-26 HISTORY — DX: Unspecified osteoarthritis, unspecified site: M19.90

## 2014-04-26 HISTORY — DX: Gastro-esophageal reflux disease without esophagitis: K21.9

## 2014-04-26 HISTORY — DX: Other complications of anesthesia, initial encounter: T88.59XA

## 2014-04-26 HISTORY — DX: Adverse effect of unspecified anesthetic, initial encounter: T41.45XA

## 2014-04-26 HISTORY — DX: Unspecified right bundle-branch block: I45.10

## 2014-04-26 LAB — CBC
HCT: 41.5 % (ref 39.0–52.0)
Hemoglobin: 14.6 g/dL (ref 13.0–17.0)
MCH: 30.7 pg (ref 26.0–34.0)
MCHC: 35.2 g/dL (ref 30.0–36.0)
MCV: 87.4 fL (ref 78.0–100.0)
PLATELETS: 284 10*3/uL (ref 150–400)
RBC: 4.75 MIL/uL (ref 4.22–5.81)
RDW: 12.8 % (ref 11.5–15.5)
WBC: 7.1 10*3/uL (ref 4.0–10.5)

## 2014-04-26 LAB — URINALYSIS, ROUTINE W REFLEX MICROSCOPIC
BILIRUBIN URINE: NEGATIVE
Glucose, UA: NEGATIVE mg/dL
Hgb urine dipstick: NEGATIVE
KETONES UR: NEGATIVE mg/dL
Leukocytes, UA: NEGATIVE
NITRITE: NEGATIVE
PROTEIN: NEGATIVE mg/dL
Specific Gravity, Urine: 1.008 (ref 1.005–1.030)
UROBILINOGEN UA: 0.2 mg/dL (ref 0.0–1.0)
pH: 6.5 (ref 5.0–8.0)

## 2014-04-26 LAB — COMPREHENSIVE METABOLIC PANEL
ALT: 19 U/L (ref 0–53)
ANION GAP: 16 — AB (ref 5–15)
AST: 24 U/L (ref 0–37)
Albumin: 4.4 g/dL (ref 3.5–5.2)
Alkaline Phosphatase: 87 U/L (ref 39–117)
BUN: 14 mg/dL (ref 6–23)
CALCIUM: 9.7 mg/dL (ref 8.4–10.5)
CO2: 25 mEq/L (ref 19–32)
CREATININE: 0.9 mg/dL (ref 0.50–1.35)
Chloride: 100 mEq/L (ref 96–112)
GFR, EST NON AFRICAN AMERICAN: 85 mL/min — AB (ref >90)
Glucose, Bld: 94 mg/dL (ref 70–99)
Potassium: 3.9 mEq/L (ref 3.7–5.3)
Sodium: 141 mEq/L (ref 137–147)
Total Bilirubin: 0.7 mg/dL (ref 0.3–1.2)
Total Protein: 7.7 g/dL (ref 6.0–8.3)

## 2014-04-26 LAB — PROTIME-INR
INR: 0.97 (ref 0.00–1.49)
PROTHROMBIN TIME: 13 s (ref 11.6–15.2)

## 2014-04-26 LAB — SURGICAL PCR SCREEN
MRSA, PCR: NEGATIVE
STAPHYLOCOCCUS AUREUS: POSITIVE — AB

## 2014-04-26 LAB — APTT: aPTT: 29 seconds (ref 24–37)

## 2014-04-26 NOTE — Patient Instructions (Signed)
BRING ENVELOPE FROM DR. ALUISIO'S OFFICE.  YOUR SURGERY IS SCHEDULED AT [facility]  ON:     Monday  December 14TH  REPORT TO  SHORT STAY CENTER AT:  7:30 AM   DO NOT EAT OR DRINK ANYTHING AFTER MIDNIGHT THE NIGHT BEFORE YOUR SURGERY.  YOU MAY BRUSH YOUR TEETH, RINSE OUT YOUR MOUTH--BUT NO WATER, NO FOOD, NO CHEWING GUM, NO MINTS, NO CANDIES, NO CHEWING TOBACCO.  PLEASE TAKE THE FOLLOWING MEDICATIONS THE AM OF YOUR SURGERY WITH A FEW SIPS OF WATER:  ZANTAC,  AND USE YOUR ALBUTEROL INHALER AND BRING INHALER.    DO NOT BRING VALUABLES, MONEY, CREDIT CARDS.  DO NOT WEAR JEWELRY, MAKE-UP, NAIL POLISH AND NO METAL PINS OR CLIPS IN YOUR HAIR. CONTACT LENS, DENTURES / PARTIALS, GLASSES SHOULD NOT BE WORN TO SURGERY AND IN MOST CASES-HEARING AIDS WILL NEED TO BE REMOVED.  BRING YOUR GLASSES CASE, ANY EQUIPMENT NEEDED FOR YOUR CONTACT LENS. FOR PATIENTS ADMITTED TO THE HOSPITAL--CHECK OUT TIME THE DAY OF DISCHARGE IS 11:00 AM.  ALL INPATIENT ROOMS ARE PRIVATE - WITH BATHROOM, TELEPHONE, TELEVISION AND WIFI INTERNET.    PLEASE BE AWARE THAT YOU MAY NEED ADDITIONAL BLOOD DRAWN DAY OF YOUR SURGERY  _______________________________________________________________________   [facility] - Preparing for Surgery Before surgery, you can play an important role.  Because skin is not sterile, your skin needs to be as free of germs as possible.  You can reduce the number of germs on your skin by washing with CHG (chlorahexidine gluconate) soap before surgery.  CHG is an antiseptic cleaner which kills germs and bonds with the skin to continue killing germs even after washing. Please DO NOT use if you have an allergy to CHG or antibacterial soaps.  If your skin becomes reddened/irritated stop using the CHG and inform your nurse when you arrive at Short Stay. Do not shave (including legs and underarms) for at least 48 hours prior to the first CHG shower.  You may shave your face/neck. Please follow  these instructions carefully:  1.  Shower with CHG Soap the night before surgery and the  morning of Surgery.  2.  If you choose to wash your hair, wash your hair first as usual with your  normal  shampoo.  3.  After you shampoo, rinse your hair and body thoroughly to remove the  shampoo.                           4.  Use CHG as you would any other liquid soap.  You can apply chg directly  to the skin and wash                       Gently with a scrungie or clean washcloth.  5.  Apply the CHG Soap to your body ONLY FROM THE NECK DOWN.   Do not use on face/ open                           Wound or open sores. Avoid contact with eyes, ears mouth and genitals (private parts).                       Wash face,  Genitals (private parts) with your normal soap.             6.  Wash thoroughly, paying special attention to the  area where your surgery  will be performed.  7.  Thoroughly rinse your body with warm water from the neck down.  8.  DO NOT shower/wash with your normal soap after using and rinsing off  the CHG Soap.                9.  Pat yourself dry with a clean towel.            10.  Wear clean pajamas.            11.  Place clean sheets on your bed the night of your first shower and do not  sleep with pets. Day of Surgery : Do not apply any lotions/deodorants the morning of surgery.  Please wear clean clothes to the hospital/surgery center.  FAILURE TO FOLLOW THESE INSTRUCTIONS MAY RESULT IN THE CANCELLATION OF YOUR SURGERY PATIENT SIGNATURE_________________________________  NURSE SIGNATURE__________________________________  ________________________________________________________________________    [name]  An incentive spirometer is a tool that can help keep your lungs clear and active. This tool measures how well you are filling your lungs with each breath. Taking long deep breaths may help reverse or decrease the chance of developing breathing (pulmonary) problems  (especially infection) following:  A long period of time when you are unable to move or be active. BEFORE THE PROCEDURE   If the spirometer includes an indicator to show your best effort, your nurse or respiratory therapist will set it to a desired goal.  If possible, sit up straight or lean slightly forward. Try not to slouch.  Hold the incentive spirometer in an upright position. INSTRUCTIONS FOR USE   Sit on the edge of your bed if possible, or sit up as far as you can in bed or on a chair.  Hold the incentive spirometer in an upright position.  Breathe out normally.  Place the mouthpiece in your mouth and seal your lips tightly around it.  Breathe in slowly and as deeply as possible, raising the piston or the ball toward the top of the column.  Hold your breath for 3-5 seconds or for as long as possible. Allow the piston or ball to fall to the bottom of the column.  Remove the mouthpiece from your mouth and breathe out normally.  Rest for a few seconds and repeat Steps 1 through 7 at least 10 times every 1-2 hours when you are awake. Take your time and take a few normal breaths between deep breaths.  The spirometer may include an indicator to show your best effort. Use the indicator as a goal to work toward during each repetition.  After each set of 10 deep breaths, practice coughing to be sure your lungs are clear. If you have an incision (the cut made at the time of surgery), support your incision when coughing by placing a pillow or rolled up towels firmly against it. Once you are able to get out of bed, walk around indoors and cough well. You may stop using the incentive spirometer when instructed by your caregiver.  RISKS AND COMPLICATIONS  Take your time so you do not get dizzy or light-headed.  If you are in pain, you may need to take or ask for pain medication before doing incentive spirometry. It is harder to take a deep breath if you are having pain. AFTER  USE  Rest and breathe slowly and easily.  It can be helpful to keep track of a log of your progress. Your caregiver can provide you with  a simple table to help with this. If you are using the spirometer at home, follow these instructions: [place] IF:   You are having difficultly using the spirometer.  You have trouble using the spirometer as often as instructed.  Your pain medication is not giving enough relief while using the spirometer.  You develop fever of 100.5 F (38.1 C) or higher. SEEK IMMEDIATE MEDICAL CARE IF:   You cough up bloody sputum that had not been present before.  You develop fever of 102 F (38.9 C) or greater.  You develop worsening pain at or near the incision site. MAKE SURE YOU:   Understand these instructions.  Will watch your condition.  Will get help right away if you are not doing well or get worse. Document Released: 09/22/2006 Document Revised: 08/04/2011 Document Reviewed: 11/23/2006 [employer] Patient Information 2014 [employer], [place].   ________________________________________________________________________  WHAT IS A BLOOD TRANSFUSION? Blood Transfusion Information  A transfusion is the replacement of blood or some of its parts. Blood is made up of multiple cells which provide different functions.  Red blood cells carry oxygen and are used for blood loss replacement.  White blood cells fight against infection.  Platelets control bleeding.  Plasma helps clot blood.  Other blood products are available for specialized needs, such as hemophilia or other clotting disorders. BEFORE THE TRANSFUSION  Who gives blood for transfusions?   Healthy volunteers who are fully evaluated to make sure their blood is safe. This is blood bank blood. Transfusion therapy is the safest it has ever been in the practice of medicine. Before blood is taken from a donor, a complete history is taken to make sure that person has no history of diseases  nor engages in risky social behavior (examples are intravenous drug use or sexual activity with multiple partners). The donor's travel history is screened to minimize risk of transmitting infections, such as malaria. The donated blood is tested for signs of infectious diseases, such as HIV and hepatitis. The blood is then tested to be sure it is compatible with you in order to minimize the chance of a transfusion reaction. If you or a relative donates blood, this is often done in anticipation of surgery and is not appropriate for emergency situations. It takes many days to process the donated blood. RISKS AND COMPLICATIONS Although transfusion therapy is very safe and saves many lives, the main dangers of transfusion include:   Getting an infectious disease.  Developing a transfusion reaction. This is an allergic reaction to something in the blood you were given. Every precaution is taken to prevent this. The decision to have a blood transfusion has been considered carefully by your caregiver before blood is given. Blood is not given unless the benefits outweigh the risks. AFTER THE TRANSFUSION  Right after receiving a blood transfusion, you will usually feel much better and more energetic. This is especially true if your red blood cells have gotten low (anemic). The transfusion raises the level of the red blood cells which carry oxygen, and this usually causes an energy increase.  The nurse administering the transfusion will monitor you carefully for complications. HOME CARE INSTRUCTIONS  No special instructions are needed after a transfusion. You may find your energy is better. Speak with your caregiver about any limitations on activity for underlying diseases you may have. SEEK MEDICAL CARE IF:   Your condition is not improving after your transfusion.  You develop redness or irritation at the intravenous (IV) site. SEEK IMMEDIATE  MEDICAL CARE IF:  Any of the following symptoms occur over the  next 12 hours:  Shaking chills.  You have a temperature by mouth above 102 F (38.9 C), not controlled by medicine.  Chest, back, or muscle pain.  People around you feel you are not acting correctly or are confused.  Shortness of breath or difficulty breathing.  Dizziness and fainting.  You get a rash or develop hives.  You have a decrease in urine output.  Your urine turns a dark color or changes to pink, red, or brown. Any of the following symptoms occur over the next 10 days:  You have a temperature by mouth above 102 F (38.9 C), not controlled by medicine.  Shortness of breath.  Weakness after normal activity.  The white part of the eye turns yellow (jaundice).  You have a decrease in the amount of urine or are urinating less often.  Your urine turns a dark color or changes to pink, red, or brown. Document Released: 05/09/2000 Document Revised: 08/04/2011 Document Reviewed: 12/27/2007 [facility] Patient Information 2014 [place], [place].  _______________________________________________________________________

## 2014-04-26 NOTE — Pre-Procedure Instructions (Signed)
PT HAS MEDICAL CLEARANCE FOR SURGERY FROM DR. GUTIERREZ & EKG AND OFFICE NOTES 01/18/14. CXR WAS DONE TODAY PREOP AS PER ANESTHESIOLOGIST'S GUIDELINES. PT'S CHART GIVEN TO FOLLOW UP NURSE WILHEMINA HENDRICKS, RN FOR REVIEW OF ALL PREOP TEST RESULTS.

## 2014-04-26 NOTE — Progress Notes (Signed)
04-26-14 1645 Positive Staph aureus PCR screen- Rx. For Mupirocin called to [facility] [phone] pharmacy line,[place], [place], fax sent to office pod [phone](Dr. Aluisio's office) -pt to use as directed.

## 2014-05-04 ENCOUNTER — Encounter: Payer: Self-pay | Admitting: Family Medicine

## 2014-05-04 ENCOUNTER — Ambulatory Visit (INDEPENDENT_AMBULATORY_CARE_PROVIDER_SITE_OTHER): Payer: 59 | Admitting: Family Medicine

## 2014-05-04 VITALS — BP 132/82 | HR 72 | Temp 97.9°F | Wt 215.0 lb

## 2014-05-04 DIAGNOSIS — K219 Gastro-esophageal reflux disease without esophagitis: Secondary | ICD-10-CM

## 2014-05-04 MED ORDER — PANTOPRAZOLE SODIUM 40 MG PO TBEC: 40.0000 mg | DELAYED_RELEASE_TABLET | Freq: Every day | ORAL | Status: DC

## 2014-05-04 NOTE — Patient Instructions (Signed)
I think this is heartburn or GERD related indigestion. Start protonix 40mg  daily for next 3 weeks.  Update [place] if not improving as expected.  Gastroesophageal Reflux Disease, Adult Gastroesophageal reflux disease (GERD) happens when acid from your stomach flows up into the esophagus. When acid comes in contact with the esophagus, the acid causes soreness (inflammation) in the esophagus. Over time, GERD may create small holes (ulcers) in the lining of the esophagus. CAUSES   Increased body weight. This puts pressure on the stomach, making acid rise from the stomach into the esophagus.  Smoking. This increases acid production in the stomach.  Drinking alcohol. This causes decreased pressure in the lower esophageal sphincter (valve or ring of muscle between the esophagus and stomach), allowing acid from the stomach into the esophagus.  Late evening meals and a full stomach. This increases pressure and acid production in the stomach.  A malformed lower esophageal sphincter. Sometimes, no cause is found. SYMPTOMS   Burning pain in the lower part of the mid-chest behind the breastbone and in the mid-stomach area. This may occur twice a week or more often.  Trouble swallowing.  Sore throat.  Dry cough.  Asthma-like symptoms including chest tightness, shortness of breath, or wheezing. DIAGNOSIS  Your caregiver may be able to diagnose GERD based on your symptoms. In some cases, X-rays and other tests may be done to check for complications or to check the condition of your stomach and esophagus. TREATMENT  Your caregiver may recommend over-the-counter or prescription medicines to help decrease acid production. Ask your caregiver before starting or adding any new medicines.  HOME CARE INSTRUCTIONS   Change the factors that you can control. Ask your caregiver for guidance concerning weight loss, quitting smoking, and alcohol consumption.  Avoid foods and drinks that make your symptoms worse,  such as:  Caffeine or alcoholic drinks.  Chocolate.  Peppermint or mint flavorings.  Garlic and onions.  Spicy foods.  Citrus fruits, such as oranges, lemons, or limes.  Tomato-based foods such as sauce, chili, salsa, and pizza.  Fried and fatty foods.  Avoid lying down for the 3 hours prior to your bedtime or prior to taking a nap.  Eat small, frequent meals instead of large meals.  Wear loose-fitting clothing. Do not wear anything tight around your waist that causes pressure on your stomach.  Raise the head of your bed 6 to 8 inches with wood blocks to help you sleep. Extra pillows will not help.  Only take over-the-counter or prescription medicines for pain, discomfort, or fever as directed by your caregiver.  Do not take aspirin, ibuprofen, or other nonsteroidal anti-inflammatory drugs (NSAIDs). SEEK IMMEDIATE MEDICAL CARE IF:   You have pain in your arms, neck, jaw, teeth, or back.  Your pain increases or changes in intensity or duration.  You develop nausea, vomiting, or sweating (diaphoresis).  You develop shortness of breath, or you faint.  Your vomit is green, yellow, black, or looks like coffee grounds or blood.  Your stool is red, bloody, or black. These symptoms could be signs of other problems, such as heart disease, gastric bleeding, or esophageal bleeding. MAKE SURE YOU:   Understand these instructions.  Will watch your condition.  Will get help right away if you are not doing well or get worse. Document Released: 02/19/2005 Document Revised: 08/04/2011 Document Reviewed: 11/29/2010 [facility] Patient Information 2015 [place], [place]. This information is not intended to replace advice given to you by your health care provider. Make sure you  discuss any questions you have with your health care provider.  

## 2014-05-04 NOTE — Assessment & Plan Note (Signed)
Story consistent with GERD - treat with protonix 40mg  daily for next 3 weeks.  Discussed dietary and lifestyle changes to improve GERD sxs - avoiding caffeine, spicy and acidic foods, separating dinner from bedtime for at least 2 hours, and sleeping on slight incline. Update if not improving as expected. Pt agrees with plan.

## 2014-05-04 NOTE — Progress Notes (Signed)
BP 132/82 mmHg  Pulse 72  Temp(Src) 97.9 F (36.6 C) (Oral)  Wt 215 lb (97.523 kg)   CC: discuss acid reflux  Subjective:    Patient ID: [name], male    DOB: 03-31-1946, 68 y.o.   MRN: [id number]  HPI: [name] is a 68 y.o. male presenting on 05/04/2014 for Gastrophageal Reflux   Increased indigestion over last 2 weeks controlled with daily zantac. Started around Thanksgiving - was cooking ribs on grill, broke a piece off to taste - very spicy, felt significant heart burn that led tot vomiting x1 (saliva).  Burning resolved after 6 hours. Finds specific foods exacerbate symptoms. Denies dysphagia, early satiety, nausea, other abd pain.   Known GERD prior controlled on prn prevacid 15mg  - has not been using. Instead has been using zantac regularly along with tums.   Upcoming knee replacement by Dr Maureen Ralphs 05/08/2014. Has been off aspirin and nsaids for last week in preparation for surgery. Goes to gym regularly 5x/wk 1 hour walks on elliptical, and then walks 3.5 miles a day - no chest pain or dypsnea with this. Also lifts weights.  Relevant past medical, surgical, family and social history reviewed and updated as indicated. Interim medical history since our last visit reviewed. Allergies and medications reviewed and updated.  Current Outpatient Prescriptions on File Prior to Visit  Medication Sig  . acetaminophen (TYLENOL) 500 MG tablet Take 500 mg by mouth every 6 (six) hours as needed.  Marland Kitchen albuterol (PROAIR HFA) 108 (90 BASE) MCG/ACT inhaler Inhale 2 puffs into the lungs every 6 (six) hours as needed for wheezing or shortness of breath.  . fluticasone (FLONASE) 50 MCG/ACT nasal spray USE 2 SPRAYS IN EACH NOSTRIL EVERY DAY AS DIRECTED (Patient taking differently: USE 2 SPRAYS IN EACH NOSTRIL EVERY DAY AS NEEDED FOR ALLERGIES.)  . Garcinia Cambogia-Chromium 500-200 MG-MCG TABS Take 1 tablet by mouth daily.   . Multiple Vitamin (MULTIVITAMIN) tablet Take 2 tablets  by mouth every morning.   . naproxen sodium (ANAPROX) 220 MG tablet Take 440 mg by mouth daily as needed (pain.).   Marland Kitchen OVER THE COUNTER MEDICATION Take 2 tablets by mouth 2 (two) times daily. Super Citrus  . pravastatin (PRAVACHOL) 40 MG tablet Take 1 tablet (40 mg total) by mouth daily. (Patient taking differently: Take 40 mg by mouth every evening. )  . ranitidine (ZANTAC) 150 MG tablet Take 150 mg by mouth daily. IN AFTERNOON  . VIAGRA 100 MG tablet TAKE 1/2 TO 1 TABLET BY MOUTH AS NEEDED   No current facility-administered medications on file prior to visit.    Review of Systems Per HPI unless specifically indicated above     Objective:    BP 132/82 mmHg  Pulse 72  Temp(Src) 97.9 F (36.6 C) (Oral)  Wt 215 lb (97.523 kg)  Wt Readings from Last 3 Encounters:  05/04/14 215 lb (97.523 kg)  04/26/14 216 lb (97.977 kg)  01/18/14 206 lb (93.441 kg)    Physical Exam  Constitutional: He appears well-developed and well-nourished. No distress.  HENT:  Mouth/Throat: Oropharynx is clear and moist. No oropharyngeal exudate.  Neck: Normal range of motion. Neck supple. No thyromegaly present.  Cardiovascular: Normal rate, regular rhythm, normal heart sounds and intact distal pulses.   No murmur heard. Pulmonary/Chest: Effort normal and breath sounds normal. No respiratory distress. He has no wheezes. He has no rales.  Abdominal: Soft. Normal appearance and bowel sounds are normal. He exhibits no distension and  no mass. There is no hepatosplenomegaly. There is no tenderness. There is no rigidity, no rebound, no guarding, no CVA tenderness and negative Murphy's sign.  Musculoskeletal: He exhibits no edema.  Lymphadenopathy:    He has no cervical adenopathy.  Nursing note and vitals reviewed.      Assessment & Plan:   Problem List Items Addressed This Visit    GERD (gastroesophageal reflux disease) - Primary    Story consistent with GERD - treat with protonix 40mg  daily for next 3  weeks.  Discussed dietary and lifestyle changes to improve GERD sxs - avoiding caffeine, spicy and acidic foods, separating dinner from bedtime for at least 2 hours, and sleeping on slight incline. Update if not improving as expected. Pt agrees with plan.    Relevant Medications      pantoprazole (PROTONIX) EC tablet       Follow up plan: Return if symptoms worsen or fail to improve.

## 2014-05-04 NOTE — Progress Notes (Signed)
Pre visit review using our clinic review tool, if applicable. No additional management support is needed unless otherwise documented below in the visit note. 

## 2014-05-07 ENCOUNTER — Other Ambulatory Visit: Payer: Self-pay | Admitting: Orthopedic Surgery

## 2014-05-07 NOTE — H&P (Signed)
[name] DOB: [birth year] Married / Language: English / Race: White Male Date of Admission:  05/08/2014 CC:  Left Knee Pain History of Present Illness  The patient is a 68 year old male who comes in for a preoperative History and Physical. The patient is scheduled for a left total knee arthroplasty to be performed by Dr. Dione Plover. Aluisio, MD at [facility] on 05-08-2014. The patient is a 68 year old male who presents  for follow up of their knee. The patient is being followed for their left knee pain and osteoarthritis. They are now 10 month(s) out from last visit. Symptoms reported today include: pain (medial and lateral) and swelling. The patient feels that they are doing poorly. Current treatment includes: activity modification. The following medication has been used for pain control: Aleve (occasionally). Note for "Follow-up Knee": Patient states that he has been having increased pain. He is unable to climb stairs due to limited ROM. He is also limited to what he can do at the gym in the mornings. He was given a wedge in his shoe last year and that helped for about 2 weeks. He states that his knee is getting progressively worse over time. He had an acute worsening of his pain last week with increased swelling and increased pain. He feels that the knee is at a stage now where it is as bad as the right knee was prior to him having it replaced. They have been treated conservatively in the past for the above stated problem and despite conservative measures, they continue to have progressive pain and severe functional limitations and dysfunction. They have failed non-operative management including home exercise, medications, and injections. It is felt that they would benefit from undergoing total joint replacement. Risks and benefits of the procedure have been discussed with the patient and they elect to proceed with surgery. There are no active contraindications to surgery such as ongoing  infection or rapidly progressive neurological disease.   Problem List/Past Medical S/P Right total knee arthroplasty (V43.65) Injury of left quadriceps muscle, fascia, or tendon (S76.102A) Heel cord contracture (727.81) Plantar fasciitis of right foot (M72.2) Aftercare following joint replacement surgery (Z47.1) Osteoarthritis of left knee (M17.9) Hypercholesterolemia Gout Kidney Stone Osteoarthrosis NOS, forearm (715.93)11/23/2002 Osteoarthrosis NOS, hand (715.94)10/24/2003 Syndrome, carpal tunnel (354.0)02/08/2004 Degeneration, lumbar/lumbosacral disc (722.52)11/10/2006  Allergies  PredniSONE *CORTICOSTEROIDS* Rash.  Family History Chronic Obstructive Lung Disease mother Cancer mother, father and grandmother mothers side Drug / Alcohol Addiction father and grandfather mothers side Heart disease in male family member before age 70 Heart Disease grandmother fathers side Osteoarthritis mother, father, sister, grandfather mothers side and grandmother fathers side Hypertension grandmother fathers side Osteoporosis First Degree Relatives. sister  Social History  Number of flights of stairs before winded 4-5 Pain Contract no Tobacco use Never smoker. 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 (Aleve) Aspirin EC (81MG  Tablet DR, Oral) Active. Pravastatin Sodium (40MG  Tablet, Oral) Active.  Past Surgical History Arthroscopy of Knee right Carpal Tunnel Surgery - Left Carpal Tunnel Repair  left Total Knee Replacement right Tonsillectomy thumb- left Arthroscopic Knee Surgery - Right  Review of Systems  General Not Present- Chills, Fatigue, Fever, Memory Loss, Night Sweats, Weight Gain and Weight Loss. Skin Not Present- Eczema, Hives, Itching, Lesions and Rash. HEENT Not Present- Dentures, Double Vision, Headache, Hearing Loss, Tinnitus and Visual Loss. Respiratory Not Present- Allergies, Chronic Cough, Coughing up blood, Shortness of breath at rest and Shortness of breath with exertion. Cardiovascular Not Present- Chest Pain, Difficulty Breathing Lying Down, Murmur, Palpitations, Racing/skipping heartbeats and Swelling. Gastrointestinal Not Present- Abdominal Pain, Bloody Stool, Constipation, Diarrhea, Difficulty Swallowing, Heartburn, Jaundice, Loss of appetitie, Nausea and Vomiting. Male Genitourinary Not Present- Blood in Urine, Discharge, Flank Pain, Incontinence, Painful Urination, Urgency, Urinary frequency, Urinary Retention, Urinating at Night and Weak urinary stream. Musculoskeletal Not Present- Back Pain, Joint Pain, Joint Swelling, Morning Stiffness, Muscle Pain, Muscle Weakness and Spasms. Neurological Not Present- Blackout spells, Difficulty with balance, Dizziness, Paralysis, Tremor and Weakness. Psychiatric Not Present- Insomnia.  Vitals  Weight: 205 lb Height: 68in Weight was reported by patient. Height was reported by patient. Body Surface Area: 2.07 m Body Mass Index: 31.17 kg/m  BP: 146/86 (Sitting, Right Arm, Standard)   Physical Exam General Mental Status -Alert, cooperative and good historian. General Appearance-pleasant, Not in acute distress. Orientation-Oriented X3. Build & Nutrition-Well nourished and Well developed.  Head and Neck Head-normocephalic, atraumatic . Neck Global Assessment - supple, no bruit auscultated on the right, no bruit auscultated on the left.  Eye Pupil - Bilateral-Regular and  Round. Motion - Bilateral-EOMI.  Chest and Lung Exam Auscultation Breath sounds - clear at anterior chest wall and clear at posterior chest wall. Adventitious sounds - No Adventitious sounds.  Cardiovascular Auscultation Rhythm - Regular rate and rhythm. Heart Sounds - S1 WNL and S2 WNL. Murmurs & Other Heart Sounds - Auscultation of the heart reveals - No Murmurs.  Abdomen Palpation/Percussion Tenderness - Abdomen is non-tender to palpation. Rigidity (guarding) - Abdomen is soft. Auscultation Auscultation of the abdomen reveals - Bowel sounds normal.  Male Genitourinary Note: Not done, not pertinent to present illness   Musculoskeletal Note: On exam he is alert and oriented in no apparent distress. His left knee shows a moderate-sized effusion. His range of motion of the left knee is about 5-115 degrees. There is moderate crepitus on range of motion. Knee is tender medial greater than lateral with no instability.  RADIOGRAPHS AP of both knees and lateral show the prosthesis on the right is in excellent position with no periprosthetic abnormalities.  On the left he has bone-on-bone arthritis in the medial and patellofemoral compartments.   Assessment & Plan Osteoarthritis of left knee (M17.9) Note:Plan is for a Left Total Knee Replacement by Dr. Wynelle Link.  Plan is to go home.  PCP - Dr. Danise Mina  The patient does not have any contraindications and will receive TXA (tranexamic acid) prior to surgery.  Signed electronically by Joelene Millin, III PA-C

## 2014-05-08 ENCOUNTER — Encounter (HOSPITAL_COMMUNITY)
Admission: RE | Disposition: A | Discharge status: Home / Self Care | Payer: Self-pay | Source: Ambulatory Visit | Attending: Orthopedic Surgery

## 2014-05-08 ENCOUNTER — Inpatient Hospital Stay (HOSPITAL_COMMUNITY)
Admission: RE | Admit: 2014-05-08 | Discharge: 2014-05-10 | DRG: 470 | Disposition: A | Discharge status: Home / Self Care | Payer: 59 | Source: Ambulatory Visit | Attending: Orthopedic Surgery | Admitting: Orthopedic Surgery

## 2014-05-08 ENCOUNTER — Encounter (HOSPITAL_COMMUNITY): Payer: Self-pay | Admitting: *Deleted

## 2014-05-08 ENCOUNTER — Inpatient Hospital Stay (HOSPITAL_COMMUNITY): Payer: 59 | Admitting: Anesthesiology

## 2014-05-08 DIAGNOSIS — J309 Allergic rhinitis, unspecified: Secondary | ICD-10-CM | POA: Diagnosis present

## 2014-05-08 DIAGNOSIS — K219 Gastro-esophageal reflux disease without esophagitis: Secondary | ICD-10-CM | POA: Diagnosis present

## 2014-05-08 DIAGNOSIS — M722 Plantar fascial fibromatosis: Secondary | ICD-10-CM | POA: Diagnosis present

## 2014-05-08 DIAGNOSIS — N200 Calculus of kidney: Secondary | ICD-10-CM | POA: Diagnosis present

## 2014-05-08 DIAGNOSIS — M109 Gout, unspecified: Secondary | ICD-10-CM | POA: Diagnosis present

## 2014-05-08 DIAGNOSIS — M179 Osteoarthritis of knee, unspecified: Secondary | ICD-10-CM | POA: Diagnosis present

## 2014-05-08 DIAGNOSIS — E780 Pure hypercholesterolemia: Secondary | ICD-10-CM | POA: Diagnosis present

## 2014-05-08 DIAGNOSIS — M1712 Unilateral primary osteoarthritis, left knee: Secondary | ICD-10-CM | POA: Diagnosis present

## 2014-05-08 DIAGNOSIS — M1710 Unilateral primary osteoarthritis, unspecified knee: Secondary | ICD-10-CM | POA: Diagnosis present

## 2014-05-08 DIAGNOSIS — I4510 Unspecified right bundle-branch block: Secondary | ICD-10-CM | POA: Diagnosis present

## 2014-05-08 HISTORY — PX: TOTAL KNEE ARTHROPLASTY: SHX125

## 2014-05-08 LAB — TYPE AND SCREEN
ABO/RH(D): O NEG
Antibody Screen: NEGATIVE

## 2014-05-08 SURGERY — ARTHROPLASTY, KNEE, TOTAL
Anesthesia: Spinal | Site: Knee | Laterality: Left

## 2014-05-08 MED ORDER — PROMETHAZINE HCL 25 MG/ML IJ SOLN: 6.2500 mg | INTRAMUSCULAR | Status: DC | PRN

## 2014-05-08 MED ORDER — SODIUM CHLORIDE 0.9 % IR SOLN
Status: DC | PRN
  Administered 2014-05-08: 1000 mL

## 2014-05-08 MED ORDER — SODIUM CHLORIDE 0.9 % IV SOLN: INTRAVENOUS | Status: DC

## 2014-05-08 MED ORDER — FENTANYL CITRATE 0.05 MG/ML IJ SOLN: 25.0000 ug | INTRAMUSCULAR | Status: DC | PRN

## 2014-05-08 MED ORDER — PROPOFOL 10 MG/ML IV BOLUS
INTRAVENOUS | Status: AC
  Filled 2014-05-08: qty 20

## 2014-05-08 MED ORDER — ALBUTEROL SULFATE (2.5 MG/3ML) 0.083% IN NEBU: 3.0000 mL | INHALATION_SOLUTION | Freq: Four times a day (QID) | RESPIRATORY_TRACT | Status: DC | PRN

## 2014-05-08 MED ORDER — BISACODYL 10 MG RE SUPP: 10.0000 mg | Freq: Every day | RECTAL | Status: DC | PRN

## 2014-05-08 MED ORDER — MEPERIDINE HCL 50 MG/ML IJ SOLN: 6.2500 mg | INTRAMUSCULAR | Status: DC | PRN

## 2014-05-08 MED ORDER — FENTANYL CITRATE 0.05 MG/ML IJ SOLN
INTRAMUSCULAR | Status: DC | PRN
  Administered 2014-05-08 (×2): 50 ug via INTRAVENOUS

## 2014-05-08 MED ORDER — BUPIVACAINE HCL (PF) 0.25 % IJ SOLN
INTRAMUSCULAR | Status: AC
  Filled 2014-05-08: qty 30

## 2014-05-08 MED ORDER — ONDANSETRON HCL 4 MG/2ML IJ SOLN
INTRAMUSCULAR | Status: AC
  Filled 2014-05-08: qty 2

## 2014-05-08 MED ORDER — FLUTICASONE PROPIONATE 50 MCG/ACT NA SUSP
2.0000 | Freq: Every day | NASAL | Status: DC
  Administered 2014-05-09: 2 via NASAL
  Filled 2014-05-08: qty 16

## 2014-05-08 MED ORDER — ONDANSETRON HCL 4 MG/2ML IJ SOLN
4.0000 mg | Freq: Four times a day (QID) | INTRAMUSCULAR | Status: DC | PRN
  Administered 2014-05-09: 4 mg via INTRAVENOUS
  Filled 2014-05-08: qty 2

## 2014-05-08 MED ORDER — CHLORHEXIDINE GLUCONATE 4 % EX LIQD: 60.0000 mL | Freq: Once | CUTANEOUS | Status: DC

## 2014-05-08 MED ORDER — ONDANSETRON HCL 4 MG PO TABS: 4.0000 mg | ORAL_TABLET | Freq: Four times a day (QID) | ORAL | Status: DC | PRN

## 2014-05-08 MED ORDER — FLEET ENEMA 7-19 GM/118ML RE ENEM: 1.0000 | ENEMA | Freq: Once | RECTAL | Status: AC | PRN

## 2014-05-08 MED ORDER — KETOROLAC TROMETHAMINE 15 MG/ML IJ SOLN: 7.5000 mg | Freq: Four times a day (QID) | INTRAMUSCULAR | Status: AC | PRN

## 2014-05-08 MED ORDER — METHOCARBAMOL 500 MG PO TABS
500.0000 mg | ORAL_TABLET | Freq: Four times a day (QID) | ORAL | Status: DC | PRN
  Administered 2014-05-08 – 2014-05-10 (×5): 500 mg via ORAL
  Filled 2014-05-08 (×6): qty 1

## 2014-05-08 MED ORDER — POLYETHYLENE GLYCOL 3350 17 G PO PACK: 17.0000 g | PACK | Freq: Every day | ORAL | Status: DC | PRN

## 2014-05-08 MED ORDER — TRANEXAMIC ACID 100 MG/ML IV SOLN
1000.0000 mg | INTRAVENOUS | Status: AC
  Administered 2014-05-08: 1000 mg via INTRAVENOUS
  Filled 2014-05-08: qty 10

## 2014-05-08 MED ORDER — 0.9 % SODIUM CHLORIDE (POUR BTL) OPTIME
TOPICAL | Status: DC | PRN
  Administered 2014-05-08: 1000 mL

## 2014-05-08 MED ORDER — MENTHOL 3 MG MT LOZG
1.0000 | LOZENGE | OROMUCOSAL | Status: DC | PRN
  Filled 2014-05-08: qty 9

## 2014-05-08 MED ORDER — FAMOTIDINE 20 MG PO TABS
20.0000 mg | ORAL_TABLET | Freq: Every day | ORAL | Status: DC
  Administered 2014-05-08 – 2014-05-10 (×3): 20 mg via ORAL
  Filled 2014-05-08 (×3): qty 1

## 2014-05-08 MED ORDER — BUPIVACAINE HCL 0.25 % IJ SOLN
INTRAMUSCULAR | Status: DC | PRN
  Administered 2014-05-08: 20 mL

## 2014-05-08 MED ORDER — DOCUSATE SODIUM 100 MG PO CAPS
100.0000 mg | ORAL_CAPSULE | Freq: Two times a day (BID) | ORAL | Status: DC
  Administered 2014-05-08 – 2014-05-10 (×4): 100 mg via ORAL

## 2014-05-08 MED ORDER — METHOCARBAMOL 1000 MG/10ML IJ SOLN
500.0000 mg | Freq: Four times a day (QID) | INTRAVENOUS | Status: DC | PRN
  Administered 2014-05-08: 500 mg via INTRAVENOUS
  Filled 2014-05-08 (×2): qty 5

## 2014-05-08 MED ORDER — CEFAZOLIN SODIUM-DEXTROSE 2-3 GM-% IV SOLR
INTRAVENOUS | Status: AC
  Filled 2014-05-08: qty 50

## 2014-05-08 MED ORDER — SODIUM CHLORIDE 0.9 % IJ SOLN
INTRAMUSCULAR | Status: AC
  Filled 2014-05-08: qty 50

## 2014-05-08 MED ORDER — LACTATED RINGERS IV SOLN
INTRAVENOUS | Status: DC
  Administered 2014-05-08: 12:00:00 via INTRAVENOUS
  Administered 2014-05-08: 1000 mL via INTRAVENOUS
  Administered 2014-05-08: 11:00:00 via INTRAVENOUS

## 2014-05-08 MED ORDER — ACETAMINOPHEN 10 MG/ML IV SOLN
1000.0000 mg | Freq: Once | INTRAVENOUS | Status: AC
  Administered 2014-05-08: 1000 mg via INTRAVENOUS
  Filled 2014-05-08: qty 100

## 2014-05-08 MED ORDER — MIDAZOLAM HCL 5 MG/5ML IJ SOLN
INTRAMUSCULAR | Status: DC | PRN
  Administered 2014-05-08: 2 mg via INTRAVENOUS

## 2014-05-08 MED ORDER — DEXAMETHASONE SODIUM PHOSPHATE 10 MG/ML IJ SOLN: 10.0000 mg | Freq: Once | INTRAMUSCULAR | Status: DC

## 2014-05-08 MED ORDER — CEFAZOLIN SODIUM-DEXTROSE 2-3 GM-% IV SOLR
2.0000 g | Freq: Four times a day (QID) | INTRAVENOUS | Status: AC
  Administered 2014-05-08 (×2): 2 g via INTRAVENOUS
  Filled 2014-05-08 (×2): qty 50

## 2014-05-08 MED ORDER — PHENOL 1.4 % MT LIQD
1.0000 | OROMUCOSAL | Status: DC | PRN
  Filled 2014-05-08: qty 177

## 2014-05-08 MED ORDER — SODIUM CHLORIDE 0.9 % IJ SOLN
INTRAMUSCULAR | Status: DC | PRN
  Administered 2014-05-08: 30 mL

## 2014-05-08 MED ORDER — RIVAROXABAN 10 MG PO TABS
10.0000 mg | ORAL_TABLET | Freq: Every day | ORAL | Status: DC
  Administered 2014-05-09 – 2014-05-10 (×2): 10 mg via ORAL
  Filled 2014-05-08 (×3): qty 1

## 2014-05-08 MED ORDER — METOCLOPRAMIDE HCL 5 MG/ML IJ SOLN: 5.0000 mg | Freq: Three times a day (TID) | INTRAMUSCULAR | Status: DC | PRN

## 2014-05-08 MED ORDER — ACETAMINOPHEN 325 MG PO TABS
650.0000 mg | ORAL_TABLET | Freq: Four times a day (QID) | ORAL | Status: DC | PRN
Start: 2014-05-09 — End: 2014-05-10

## 2014-05-08 MED ORDER — ACETAMINOPHEN 650 MG RE SUPP: 650.0000 mg | Freq: Four times a day (QID) | RECTAL | Status: DC | PRN

## 2014-05-08 MED ORDER — PANTOPRAZOLE SODIUM 40 MG PO TBEC
40.0000 mg | DELAYED_RELEASE_TABLET | Freq: Every day | ORAL | Status: DC
Start: 2014-05-09 — End: 2014-05-10
  Administered 2014-05-09 – 2014-05-10 (×2): 40 mg via ORAL
  Filled 2014-05-08 (×2): qty 1

## 2014-05-08 MED ORDER — DIPHENHYDRAMINE HCL 12.5 MG/5ML PO ELIX: 12.5000 mg | ORAL_SOLUTION | ORAL | Status: DC | PRN

## 2014-05-08 MED ORDER — MORPHINE SULFATE 2 MG/ML IJ SOLN
1.0000 mg | INTRAMUSCULAR | Status: DC | PRN
  Administered 2014-05-08 (×2): 1 mg via INTRAVENOUS
  Filled 2014-05-08 (×2): qty 1

## 2014-05-08 MED ORDER — METOCLOPRAMIDE HCL 10 MG PO TABS: 5.0000 mg | ORAL_TABLET | Freq: Three times a day (TID) | ORAL | Status: DC | PRN

## 2014-05-08 MED ORDER — ACETAMINOPHEN 500 MG PO TABS
1000.0000 mg | ORAL_TABLET | Freq: Four times a day (QID) | ORAL | Status: AC
  Administered 2014-05-08 – 2014-05-09 (×4): 1000 mg via ORAL
  Filled 2014-05-08 (×5): qty 2

## 2014-05-08 MED ORDER — CEFAZOLIN SODIUM-DEXTROSE 2-3 GM-% IV SOLR
2.0000 g | INTRAVENOUS | Status: AC
  Administered 2014-05-08: 2 g via INTRAVENOUS

## 2014-05-08 MED ORDER — TRAMADOL HCL 50 MG PO TABS: 50.0000 mg | ORAL_TABLET | Freq: Four times a day (QID) | ORAL | Status: DC | PRN

## 2014-05-08 MED ORDER — BUPIVACAINE LIPOSOME 1.3 % IJ SUSP
20.0000 mL | Freq: Once | INTRAMUSCULAR | Status: DC
  Filled 2014-05-08: qty 20

## 2014-05-08 MED ORDER — OXYCODONE HCL 5 MG PO TABS
5.0000 mg | ORAL_TABLET | ORAL | Status: DC | PRN
  Administered 2014-05-08 (×3): 10 mg via ORAL
  Administered 2014-05-09 (×3): 5 mg via ORAL
  Administered 2014-05-09 (×3): 10 mg via ORAL
  Administered 2014-05-10 (×2): 5 mg via ORAL
  Administered 2014-05-10: 10 mg via ORAL
  Administered 2014-05-10: 5 mg via ORAL
  Filled 2014-05-08 (×3): qty 2
  Filled 2014-05-08: qty 1
  Filled 2014-05-08: qty 2
  Filled 2014-05-08: qty 1
  Filled 2014-05-08: qty 2
  Filled 2014-05-08: qty 1
  Filled 2014-05-08 (×4): qty 2
  Filled 2014-05-08: qty 1

## 2014-05-08 MED ORDER — BUPIVACAINE LIPOSOME 1.3 % IJ SUSP
INTRAMUSCULAR | Status: DC | PRN
  Administered 2014-05-08: 20 mL

## 2014-05-08 MED ORDER — PROPOFOL INFUSION 10 MG/ML OPTIME
INTRAVENOUS | Status: DC | PRN
  Administered 2014-05-08: 75 ug/kg/min via INTRAVENOUS

## 2014-05-08 MED ORDER — BUPIVACAINE IN DEXTROSE 0.75-8.25 % IT SOLN
INTRATHECAL | Status: DC | PRN
  Administered 2014-05-08: 1.5 mL via INTRATHECAL

## 2014-05-08 MED ORDER — ONDANSETRON HCL 4 MG/2ML IJ SOLN
INTRAMUSCULAR | Status: DC | PRN
  Administered 2014-05-08: 4 mg via INTRAVENOUS

## 2014-05-08 MED ORDER — KCL IN DEXTROSE-NACL 20-5-0.9 MEQ/L-%-% IV SOLN
INTRAVENOUS | Status: DC
  Administered 2014-05-08 – 2014-05-09 (×2): via INTRAVENOUS
  Filled 2014-05-08 (×6): qty 1000

## 2014-05-08 MED ORDER — FENTANYL CITRATE 0.05 MG/ML IJ SOLN
INTRAMUSCULAR | Status: AC
  Filled 2014-05-08: qty 2

## 2014-05-08 MED ORDER — MIDAZOLAM HCL 2 MG/2ML IJ SOLN
INTRAMUSCULAR | Status: AC
  Filled 2014-05-08: qty 2

## 2014-05-08 SURGICAL SUPPLY — 58 items
BAG ZIPLOCK 12X15 (MISCELLANEOUS) ×3 IMPLANT
BANDAGE ELASTIC 6 VELCRO ST LF (GAUZE/BANDAGES/DRESSINGS) ×3 IMPLANT
BANDAGE ESMARK 6X9 LF (GAUZE/BANDAGES/DRESSINGS) ×1 IMPLANT
BLADE SAG 18X100X1.27 (BLADE) ×3 IMPLANT
BLADE SAW SGTL 11.0X1.19X90.0M (BLADE) ×3 IMPLANT
BNDG ESMARK 6X9 LF (GAUZE/BANDAGES/DRESSINGS) ×3
BOWL SMART MIX CTS (DISPOSABLE) ×3 IMPLANT
CAP KNEE TOTAL 3 SIGMA ×3 IMPLANT
CEMENT HV SMART SET (Cement) ×6 IMPLANT
CLOSURE WOUND 1/2 X4 (GAUZE/BANDAGES/DRESSINGS) ×2
CUFF TOURN SGL QUICK 34 (TOURNIQUET CUFF) ×2
CUFF TRNQT CYL 34X4X40X1 (TOURNIQUET CUFF) ×1 IMPLANT
DECANTER SPIKE VIAL GLASS SM (MISCELLANEOUS) ×3 IMPLANT
DRAPE EXTREMITY T 121X128X90 (DRAPE) ×3 IMPLANT
DRAPE POUCH INSTRU U-SHP 10X18 (DRAPES) ×3 IMPLANT
DRAPE U-SHAPE 47X51 STRL (DRAPES) ×3 IMPLANT
DRSG ADAPTIC 3X8 NADH LF (GAUZE/BANDAGES/DRESSINGS) ×3 IMPLANT
DRSG PAD ABDOMINAL 8X10 ST (GAUZE/BANDAGES/DRESSINGS) ×3 IMPLANT
DURAPREP 26ML APPLICATOR (WOUND CARE) ×3 IMPLANT
ELECT REM PT RETURN 9FT ADLT (ELECTROSURGICAL) ×3
ELECTRODE REM PT RTRN 9FT ADLT (ELECTROSURGICAL) ×1 IMPLANT
EVACUATOR 1/8 PVC DRAIN (DRAIN) ×3 IMPLANT
FACESHIELD WRAPAROUND (MASK) ×15 IMPLANT
GAUZE SPONGE 4X4 12PLY STRL (GAUZE/BANDAGES/DRESSINGS) ×3 IMPLANT
GLOVE BIO SURGEON STRL SZ7.5 (GLOVE) ×3 IMPLANT
GLOVE BIO SURGEON STRL SZ8 (GLOVE) ×6 IMPLANT
GLOVE BIOGEL PI IND STRL 6.5 (GLOVE) ×2 IMPLANT
GLOVE BIOGEL PI IND STRL 8 (GLOVE) ×2 IMPLANT
GLOVE BIOGEL PI INDICATOR 6.5 (GLOVE) ×4
GLOVE BIOGEL PI INDICATOR 8 (GLOVE) ×4
GOWN STRL REUS W/TWL LRG LVL3 (GOWN DISPOSABLE) ×6 IMPLANT
GOWN STRL REUS W/TWL XL LVL3 (GOWN DISPOSABLE) ×6 IMPLANT
HANDPIECE INTERPULSE COAX TIP (DISPOSABLE) ×2
IMMOBILIZER KNEE 20 (SOFTGOODS) ×3
IMMOBILIZER KNEE 20 THIGH 36 (SOFTGOODS) ×1 IMPLANT
KIT BASIN OR (CUSTOM PROCEDURE TRAY) ×3 IMPLANT
MANIFOLD NEPTUNE II (INSTRUMENTS) ×3 IMPLANT
NDL SAFETY ECLIPSE 18X1.5 (NEEDLE) ×2 IMPLANT
NEEDLE HYPO 18GX1.5 SHARP (NEEDLE) ×4
NS IRRIG 1000ML POUR BTL (IV SOLUTION) ×3 IMPLANT
PACK TOTAL JOINT (CUSTOM PROCEDURE TRAY) ×3 IMPLANT
PADDING CAST COTTON 6X4 STRL (CAST SUPPLIES) ×9 IMPLANT
PIN STEINMAN FIXATION KNEE (PIN) ×3 IMPLANT
POSITIONER SURGICAL ARM (MISCELLANEOUS) ×3 IMPLANT
SET HNDPC FAN SPRY TIP SCT (DISPOSABLE) ×1 IMPLANT
STRIP CLOSURE SKIN 1/2X4 (GAUZE/BANDAGES/DRESSINGS) ×4 IMPLANT
SUCTION FRAZIER 12FR DISP (SUCTIONS) ×3 IMPLANT
SUT MNCRL AB 4-0 PS2 18 (SUTURE) ×3 IMPLANT
SUT VIC AB 2-0 CT1 27 (SUTURE) ×6
SUT VIC AB 2-0 CT1 TAPERPNT 27 (SUTURE) ×3 IMPLANT
SUT VLOC 180 0 24IN GS25 (SUTURE) ×3 IMPLANT
SYR 20CC LL (SYRINGE) ×3 IMPLANT
SYR 50ML LL SCALE MARK (SYRINGE) ×3 IMPLANT
TOWEL OR 17X26 10 PK STRL BLUE (TOWEL DISPOSABLE) ×3 IMPLANT
TOWEL OR NON WOVEN STRL DISP B (DISPOSABLE) ×3 IMPLANT
TRAY FOLEY CATH 16FRSI W/METER (SET/KITS/TRAYS/PACK) ×3 IMPLANT
WATER STERILE IRR 1500ML POUR (IV SOLUTION) ×3 IMPLANT
WRAP KNEE MAXI GEL POST OP (GAUZE/BANDAGES/DRESSINGS) ×3 IMPLANT

## 2014-05-08 NOTE — Interval H&P Note (Signed)
History and Physical Interval Note:  05/08/2014 8:32 AM  [name]  has presented today for surgery, with the diagnosis of LEFT KNEE OA  The various methods of treatment have been discussed with the patient and family. After consideration of risks, benefits and other options for treatment, the patient has consented to  Procedure(s): LEFT TOTAL KNEE ARTHROPLASTY (Left) as a surgical intervention .  The patient's history has been reviewed, patient examined, no change in status, stable for surgery.  I have reviewed the patient's chart and labs.  Questions were answered to the patient's satisfaction.     [name]

## 2014-05-08 NOTE — Anesthesia Procedure Notes (Signed)
Spinal Patient location during procedure: OR Staffing Anesthesiologist: Lillyanne Bradburn Performed by: anesthesiologist  Preanesthetic Checklist Completed: patient identified, site marked, surgical consent, pre-op evaluation, timeout performed, IV checked, risks and benefits discussed and monitors and equipment checked Spinal Block Patient position: sitting Prep: Betadine Patient monitoring: heart rate, continuous pulse ox and blood pressure Approach: right paramedian Location: L4-5 Injection technique: single-shot Needle Needle type: Spinocan  Needle gauge: 22 G Needle length: 9 cm Additional Notes Expiration date of kit checked and confirmed. Patient tolerated procedure well, without complications.     

## 2014-05-08 NOTE — H&P (View-Only) (Signed)
[name] DOB: Mar 08, 1946 Married / Language: English / Race: White Male Date of Admission:  05/08/2014 CC:  Left Knee Pain History of Present Illness  The patient is a 68 year old male who comes in for a preoperative History and Physical. The patient is scheduled for a left total knee arthroplasty to be performed by Dr. Dione Plover. Aluisio, MD at [facility] on 05-08-2014. The patient is a 68 year old male who presents  for follow up of their knee. The patient is being followed for their left knee pain and osteoarthritis. They are now 10 month(s) out from last visit. Symptoms reported today include: pain (medial and lateral) and swelling. The patient feels that they are doing poorly. Current treatment includes: activity modification. The following medication has been used for pain control: Aleve (occasionally). Note for "Follow-up Knee": Patient states that he has been having increased pain. He is unable to climb stairs due to limited ROM. He is also limited to what he can do at the gym in the mornings. He was given a wedge in his shoe last year and that helped for about 2 weeks. He states that his knee is getting progressively worse over time. He had an acute worsening of his pain last week with increased swelling and increased pain. He feels that the knee is at a stage now where it is as bad as the right knee was prior to him having it replaced. They have been treated conservatively in the past for the above stated problem and despite conservative measures, they continue to have progressive pain and severe functional limitations and dysfunction. They have failed non-operative management including home exercise, medications, and injections. It is felt that they would benefit from undergoing total joint replacement. Risks and benefits of the procedure have been discussed with the patient and they elect to proceed with surgery. There are no active contraindications to surgery such as ongoing  infection or rapidly progressive neurological disease.   Problem List/Past Medical S/P Right total knee arthroplasty (V43.65) Injury of left quadriceps muscle, fascia, or tendon (S76.102A) Heel cord contracture (727.81) Plantar fasciitis of right foot (M72.2) Aftercare following joint replacement surgery (Z47.1) Osteoarthritis of left knee (M17.9) Hypercholesterolemia Gout Kidney Stone Osteoarthrosis NOS, forearm (715.93)11/23/2002 Osteoarthrosis NOS, hand (715.94)10/24/2003 Syndrome, carpal tunnel (354.0)02/08/2004 Degeneration, lumbar/lumbosacral disc (722.52)11/10/2006  Allergies  PredniSONE *CORTICOSTEROIDS* Rash.  Family History Chronic Obstructive Lung Disease mother Cancer mother, father and grandmother mothers side Drug / Alcohol Addiction father and grandfather mothers side Heart disease in male family member before age 13 Heart Disease grandmother fathers side Osteoarthritis mother, father, sister, grandfather mothers side and grandmother fathers side Hypertension grandmother fathers side Osteoporosis First Degree Relatives. sister  Social History  Number of flights of stairs before winded 4-5 Pain Contract no Tobacco use Never smoker. 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 (Aleve) Aspirin EC (81MG  Tablet DR, Oral) Active. Pravastatin Sodium (40MG  Tablet, Oral) Active.  Past Surgical History Arthroscopy of Knee right Carpal Tunnel Surgery - Left Carpal Tunnel Repair  left Total Knee Replacement right Tonsillectomy thumb- left Arthroscopic Knee Surgery - Right  Review of Systems  General Not Present- Chills, Fatigue, Fever, Memory Loss, Night Sweats, Weight Gain and Weight Loss. Skin Not Present- Eczema, Hives, Itching, Lesions and Rash. HEENT Not Present- Dentures, Double Vision, Headache, Hearing Loss, Tinnitus and Visual Loss. Respiratory Not Present- Allergies, Chronic Cough, Coughing up blood, Shortness of breath at rest and Shortness of breath with exertion. Cardiovascular Not Present- Chest Pain, Difficulty Breathing Lying Down, Murmur, Palpitations, Racing/skipping heartbeats and Swelling. Gastrointestinal Not Present- Abdominal Pain, Bloody Stool, Constipation, Diarrhea, Difficulty Swallowing, Heartburn, Jaundice, Loss of appetitie, Nausea and Vomiting. Male Genitourinary Not Present- Blood in Urine, Discharge, Flank Pain, Incontinence, Painful Urination, Urgency, Urinary frequency, Urinary Retention, Urinating at Night and Weak urinary stream. Musculoskeletal Not Present- Back Pain, Joint Pain, Joint Swelling, Morning Stiffness, Muscle Pain, Muscle Weakness and Spasms. Neurological Not Present- Blackout spells, Difficulty with balance, Dizziness, Paralysis, Tremor and Weakness. Psychiatric Not Present- Insomnia.  Vitals  Weight: 205 lb Height: 68in Weight was reported by patient. Height was reported by patient. Body Surface Area: 2.07 m Body Mass Index: 31.17 kg/m  BP: 146/86 (Sitting, Right Arm, Standard)   Physical Exam General Mental Status -Alert, cooperative and good historian. General Appearance-pleasant, Not in acute distress. Orientation-Oriented X3. Build & Nutrition-Well nourished and Well developed.  Head and Neck Head-normocephalic, atraumatic . Neck Global Assessment - supple, no bruit auscultated on the right, no bruit auscultated on the left.  Eye Pupil - Bilateral-Regular and  Round. Motion - Bilateral-EOMI.  Chest and Lung Exam Auscultation Breath sounds - clear at anterior chest wall and clear at posterior chest wall. Adventitious sounds - No Adventitious sounds.  Cardiovascular Auscultation Rhythm - Regular rate and rhythm. Heart Sounds - S1 WNL and S2 WNL. Murmurs & Other Heart Sounds - Auscultation of the heart reveals - No Murmurs.  Abdomen Palpation/Percussion Tenderness - Abdomen is non-tender to palpation. Rigidity (guarding) - Abdomen is soft. Auscultation Auscultation of the abdomen reveals - Bowel sounds normal.  Male Genitourinary Note: Not done, not pertinent to present illness   Musculoskeletal Note: On exam he is alert and oriented in no apparent distress. His left knee shows a moderate-sized effusion. His range of motion of the left knee is about 5-115 degrees. There is moderate crepitus on range of motion. Knee is tender medial greater than lateral with no instability.  RADIOGRAPHS AP of both knees and lateral show the prosthesis on the right is in excellent position with no periprosthetic abnormalities.  On the left he has bone-on-bone arthritis in the medial and patellofemoral compartments.   Assessment & Plan Osteoarthritis of left knee (M17.9) Note:Plan is for a Left Total Knee Replacement by Dr. Wynelle Link.  Plan is to go home.  PCP - Dr. Danise Mina  The patient does not have any contraindications and will receive TXA (tranexamic acid) prior to surgery.  Signed electronically by Joelene Millin, III PA-C

## 2014-05-08 NOTE — Progress Notes (Signed)
Utilization review completed.  

## 2014-05-08 NOTE — Anesthesia Postprocedure Evaluation (Signed)
  Anesthesia Post-op Note  Patient: [name]  Procedure(s) Performed: Procedure(s) (LRB): LEFT TOTAL KNEE ARTHROPLASTY (Left)  Patient Location: PACU  Anesthesia Type: Spinal  Level of Consciousness: awake and alert   Airway and Oxygen Therapy: Patient Spontanous Breathing  Post-op Pain: mild  Post-op Assessment: Post-op Vital signs reviewed, Patient's Cardiovascular Status Stable, Respiratory Function Stable, Patent Airway and No signs of Nausea or vomiting  Last Vitals:  Filed Vitals:   05/08/14 1800  BP: 114/71  Pulse: 56  Temp: 36.4 C  Resp: 15    Post-op Vital Signs: stable   Complications: No apparent anesthesia complications

## 2014-05-08 NOTE — Plan of Care (Signed)
Problem: Consults Goal: Diagnosis- Total Joint Replacement Left total knee     

## 2014-05-08 NOTE — Anesthesia Preprocedure Evaluation (Signed)
Anesthesia Evaluation  Patient identified by MRN, date of birth, ID band Patient awake    Reviewed: Allergy & Precautions, H&P , NPO status , Patient's Chart, lab work & pertinent test results  Airway Mallampati: III  TM Distance: >3 FB Neck ROM: Full    Dental no notable dental hx.    Pulmonary neg pulmonary ROS,  breath sounds clear to auscultation  Pulmonary exam normal       Cardiovascular Rhythm:Regular Rate:Normal  RBBB   Neuro/Psych negative neurological ROS  negative psych ROS   GI/Hepatic negative GI ROS, Neg liver ROS,   Endo/Other  negative endocrine ROS  Renal/GU negative Renal ROS  negative genitourinary   Musculoskeletal negative musculoskeletal ROS (+)   Abdominal   Peds negative pediatric ROS (+)  Hematology negative hematology ROS (+)   Anesthesia Other Findings   Reproductive/Obstetrics negative OB ROS                             Anesthesia Physical Anesthesia Plan  ASA: II  Anesthesia Plan: Spinal   Post-op Pain Management:    Induction:   Airway Management Planned: Simple Face Mask  Additional Equipment:   Intra-op Plan:   Post-operative Plan:   Informed Consent: I have reviewed the patients History and Physical, chart, labs and discussed the procedure including the risks, benefits and alternatives for the proposed anesthesia with the patient or authorized representative who has indicated his/her understanding and acceptance.   Dental advisory given  Plan Discussed with: CRNA  Anesthesia Plan Comments:         Anesthesia Quick Evaluation

## 2014-05-08 NOTE — Op Note (Signed)
Pre-operative diagnosis- Osteoarthritis  Left knee(s)  Post-operative diagnosis- Osteoarthritis Left knee(s)  Procedure-  Left  Total Knee Arthroplasty  Surgeon- Dione Plover. Mariyah Upshaw, MD  Assistant- Arlee Muslim, PA-C   Anesthesia-  Spinal  EBL-* No blood loss amount entered *   Drains Hemovac  Tourniquet time-  Total Tourniquet Time Documented: Thigh (Left) - 39 minutes Total: Thigh (Left) - 39 minutes     Complications- None  Condition-PACU - hemodynamically stable.   Brief Clinical Note   [name] is a 68 y.o. year old male with end stage OA of his left knee with progressively worsening pain and dysfunction. He has constant pain, with activity and at rest and significant functional deficits with difficulties even with ADLs. He has had extensive non-op management including analgesics, injections of cortisone and viscosupplements, and home exercise program, but remains in significant pain with significant dysfunction. Radiographs show bone on bone arthritis medial and patellofemoral. He presents now for left Total Knee Arthroplasty.     Procedure in detail---   The patient is brought into the operating room and positioned supine on the operating table. After successful administration of  Spinal,   a tourniquet is placed high on the  Left thigh(s) and the lower extremity is prepped and draped in the usual sterile fashion. Time out is performed by the operating team and then the  Left lower extremity is wrapped in Esmarch, knee flexed and the tourniquet inflated to 300 mmHg.       A midline incision is made with a ten blade through the subcutaneous tissue to the level of the extensor mechanism. A fresh blade is used to make a medial parapatellar arthrotomy. Soft tissue over the proximal medial tibia is subperiosteally elevated to the joint line with a knife and into the semimembranosus bursa with a Cobb elevator. Soft tissue over the proximal lateral tibia is elevated with  attention being paid to avoiding the patellar tendon on the tibial tubercle. The patella is everted, knee flexed 90 degrees and the ACL and PCL are removed. Findings are bone on bone medial and patellofemoral with large global osteophytes.        The drill is used to create a starting hole in the distal femur and the canal is thoroughly irrigated with sterile saline to remove the fatty contents. The 5 degree Left  valgus alignment guide is placed into the femoral canal and the distal femoral cutting block is pinned to remove 10 mm off the distal femur. Resection is made with an oscillating saw.      The tibia is subluxed forward and the menisci are removed. The extramedullary alignment guide is placed referencing proximally at the medial aspect of the tibial tubercle and distally along the second metatarsal axis and tibial crest. The block is pinned to remove 47mm off the more deficient medial  side. Resection is made with an oscillating saw. Size 4is the most appropriate size for the tibia and the proximal tibia is prepared with the modular drill and keel punch for that size.      The femoral sizing guide is placed and size 5 is most appropriate. Rotation is marked off the epicondylar axis and confirmed by creating a rectangular flexion gap at 90 degrees. The size 5 cutting block is pinned in this rotation and the anterior, posterior and chamfer cuts are made with the oscillating saw. The intercondylar block is then placed and that cut is made.      Trial size 4  tibial component, trial size 5 posterior stabilized femur and a 10  mm posterior stabilized rotating platform insert trial is placed. Full extension is achieved with excellent varus/valgus and anterior/posterior balance throughout full range of motion. The patella is everted and thickness measured to be 27  mm. Free hand resection is taken to 15 mm, a 41 template is placed, lug holes are drilled, trial patella is placed, and it tracks normally.  Osteophytes are removed off the posterior femur with the trial in place. All trials are removed and the cut bone surfaces prepared with pulsatile lavage. Cement is mixed and once ready for implantation, the size 4 tibial implant, size  5 posterior stabilized femoral component, and the size 41 patella are cemented in place and the patella is held with the clamp. The trial insert is placed and the knee held in full extension. The Exparel (20 ml mixed with 30 ml saline) and .25% Bupivicaine, are injected into the extensor mechanism, posterior capsule, medial and lateral gutters and subcutaneous tissues.  All extruded cement is removed and once the cement is hard the permanent 10 mm posterior stabilized rotating platform insert is placed into the tibial tray.      The wound is copiously irrigated with saline solution and the extensor mechanism closed over a hemovac drain with #1 V-loc suture. The tourniquet is released for a total tourniquet time of 39  minutes. Flexion against gravity is 140 degrees and the patella tracks normally. Subcutaneous tissue is closed with 2.0 vicryl and subcuticular with running 4.0 Monocryl. The incision is cleaned and dried and steri-strips and a bulky sterile dressing are applied. The limb is placed into a knee immobilizer and the patient is awakened and transported to recovery in stable condition.      Please note that a surgical assistant was a medical necessity for this procedure in order to perform it in a safe and expeditious manner. Surgical assistant was necessary to retract the ligaments and vital neurovascular structures to prevent injury to them and also necessary for proper positioning of the limb to allow for anatomic placement of the prosthesis.   Dione Plover Bracen Schum, MD    05/08/2014, 11:19 AM

## 2014-05-08 NOTE — Transfer of Care (Signed)
Immediate Anesthesia Transfer of Care Note  Patient: [name]  Procedure(s) Performed: Procedure(s): LEFT TOTAL KNEE ARTHROPLASTY (Left)  Patient Location: PACU  Anesthesia Type:Spinal  Level of Consciousness: awake, alert , oriented and patient cooperative  Airway & Oxygen Therapy: Patient Spontanous Breathing and Patient connected to face mask oxygen  Post-op Assessment: Report given to PACU RN and Post -op Vital signs reviewed and stable  Post vital signs: Reviewed and stable  Complications: No apparent anesthesia complications

## 2014-05-08 NOTE — Progress Notes (Signed)
PT Cancellation Note  Patient Details Name: [name] MRN: [id number] DOB: 1946-05-07   Cancelled Treatment:    Reason Eval/Treat Not Completed: Pain limiting ability to participate   Sherron Mapp,KATHrine E 05/08/2014, 4:02 PM Carmelia Bake, PT, DPT 05/08/2014 Pager: [phone]

## 2014-05-09 ENCOUNTER — Encounter (HOSPITAL_COMMUNITY): Payer: Self-pay | Admitting: Orthopedic Surgery

## 2014-05-09 LAB — BASIC METABOLIC PANEL
ANION GAP: 9 (ref 5–15)
BUN: 11 mg/dL (ref 6–23)
CO2: 28 meq/L (ref 19–32)
CREATININE: 0.97 mg/dL (ref 0.50–1.35)
Calcium: 8.4 mg/dL (ref 8.4–10.5)
Chloride: 100 mEq/L (ref 96–112)
GFR calc non Af Amer: 82 mL/min — ABNORMAL LOW (ref >90)
Glucose, Bld: 137 mg/dL — ABNORMAL HIGH (ref 70–99)
Potassium: 4.2 mEq/L (ref 3.7–5.3)
SODIUM: 137 meq/L (ref 137–147)

## 2014-05-09 LAB — CBC
HCT: 39.7 % (ref 39.0–52.0)
Hemoglobin: 12.9 g/dL — ABNORMAL LOW (ref 13.0–17.0)
MCH: 30 pg (ref 26.0–34.0)
MCHC: 32.5 g/dL (ref 30.0–36.0)
MCV: 92.3 fL (ref 78.0–100.0)
PLATELETS: 282 10*3/uL (ref 150–400)
RBC: 4.3 MIL/uL (ref 4.22–5.81)
RDW: 13.2 % (ref 11.5–15.5)
WBC: 9.4 10*3/uL (ref 4.0–10.5)

## 2014-05-09 MED ORDER — TRAMADOL HCL 50 MG PO TABS: 50.0000 mg | ORAL_TABLET | Freq: Four times a day (QID) | ORAL | Status: DC | PRN

## 2014-05-09 MED ORDER — METHOCARBAMOL 500 MG PO TABS: 500.0000 mg | ORAL_TABLET | Freq: Four times a day (QID) | ORAL | Status: DC | PRN

## 2014-05-09 MED ORDER — RIVAROXABAN 10 MG PO TABS: 10.0000 mg | ORAL_TABLET | Freq: Every day | ORAL | Status: DC

## 2014-05-09 MED ORDER — OXYCODONE HCL 5 MG PO TABS: 5.0000 mg | ORAL_TABLET | ORAL | Status: DC | PRN

## 2014-05-09 NOTE — Care Management Note (Signed)
    Page 1 of 2   05/09/2014     8:57:51 AM CARE MANAGEMENT NOTE 05/09/2014  Patient:  [name]   Account Number:  [id number]  Date Initiated:  05/09/2014  Documentation initiated by:  [facility]  Subjective/Objective Assessment:   adm: LEFT TOTAL KNEE ARTHROPLASTY (Left)     Action/Plan:   discharge planning   Anticipated DC Date:  05/09/2014   Anticipated DC Plan:  [place]  CM consult      [facility] Choice  HOME HEALTH   Choice offered to / List presented to:  C-1 Patient   DME arranged  [name]      DME agency  [place] arranged  [place]   Status of service:  Completed, signed off Medicare Important Message given?   (If response is "NO", the following Medicare IM given date fields will be blank) Date Medicare IM given:   Medicare IM given by:   Date Additional Medicare IM given:   Additional Medicare IM given by:    Discharge Disposition:  [place]  Per UR Regulation:    If discussed at Long Length of Stay Meetings, dates discussed:    Comments:  05/09/14 08:00 CM met with pt and his wife [name] in room to offer choice of home health agency.  family chooses [name] to render HHPT.  Vicente Masson states they have a 3n1 but need a rolling walker.  Address and contact information verified with [name] and additional number for primary contact is [name] [phone].  CM called [facility] DME rep, Lecretia to please deliver the rolling walker to room prior to discharge today.  Referral called to Sha[facility].  No other CM needs were communicated.  Mariane Masters, BSN, CM [phone].

## 2014-05-09 NOTE — Progress Notes (Signed)
Physical Therapy Treatment Patient Details Name: [name] MRN: [id number] DOB: [birth year] Today's Date: 05/09/2014    History of Present Illness s/p L TKA    PT Comments    Progressing well, hoping for d.c tomorrow  Follow Up Recommendations  Home health PT     Equipment Recommendations  Rolling walker with 5" wheels    Recommendations for Other Services       Precautions / Restrictions Precautions Precautions: Fall;Knee Required Braces or Orthoses: Knee Immobilizer - Left Knee Immobilizer - Left: Discontinue once straight leg raise with < 10 degree lag (Pt performed IND SLR this date) Restrictions Weight Bearing Restrictions: No    Mobility  Bed Mobility Overal bed mobility: Needs Assistance Bed Mobility: Supine to Sit;Sit to Supine     Supine to sit: Supervision Sit to supine: Min guard   General bed mobility comments: cues for self assist, incr time  Transfers Overall transfer level: Needs assistance Equipment used: Rolling walker (2 wheeled) Transfers: Sit to/from Stand Sit to Stand: Min guard         General transfer comment: cues for hand placement  Ambulation/Gait Ambulation/Gait assistance: Min assist Ambulation Distance (Feet): 55 Feet Assistive device: Rolling walker (2 wheeled) Gait Pattern/deviations: Step-to pattern;Decreased step length - right;Decreased step length - left;Shuffle;Trunk flexed Gait velocity: decr   General Gait Details: cues for posture and position from [employer]            Wheelchair Mobility    Modified Rankin (Stroke Patients Only)       Balance                                    Cognition Arousal/Alertness: Awake/alert Behavior During Therapy: WFL for tasks assessed/performed Overall Cognitive Status: Within Functional Limits for tasks assessed                      Exercises Total Joint Exercises Ankle Circles/Pumps: AROM;Both;10 reps Quad Sets: 10  reps;AROM;Both Heel Slides: AAROM;Left;15 reps;Supine Straight Leg Raises: AAROM;AROM;Left;15 reps;Supine    General Comments        Pertinent Vitals/Pain Pain Assessment: 0-10 Pain Score: 4  Pain Location: L knee Pain Descriptors / Indicators: Aching;Sore Pain Intervention(s): Limited activity within patient's tolerance;Monitored during session;Premedicated before session;Ice applied    Home Living                      Prior Function            PT Goals (current goals can now be found in the care plan section) Acute Rehab PT Goals Patient Stated Goal: return to playing golf, going to gym PT Goal Formulation: With patient Time For Goal Achievement: 05/12/14 Potential to Achieve Goals: Good Progress towards PT goals: Progressing toward goals    Frequency  7X/week    PT Plan Current plan remains appropriate    Co-evaluation             End of Session Equipment Utilized During Treatment: Gait belt Activity Tolerance: Patient tolerated treatment well Patient left: in bed;with call bell/phone within reach;with family/visitor present     Time: 1531-1606 PT Time Calculation (min) (ACUTE ONLY): 35 min  Charges:  $Gait Training: 8-22 mins $Therapeutic Exercise: 8-22 mins                    G Codes:  Claribel Sachs 05/09/2014, 5:01 PM

## 2014-05-09 NOTE — Progress Notes (Signed)
OT Cancellation Note  Patient Details Name: [name] MRN: [id number] DOB: December 10, 1945   Cancelled Treatment:     Pt had other knee replaced.  Wife will assist; they have DME and no questions for OT  [facility] 05/09/2014, 11:57 AM  Lesle Chris, OTR/L [phone] 05/09/2014

## 2014-05-09 NOTE — Discharge Instructions (Addendum)
° °Dr. Frank Aluisio °Total Joint Specialist °[facility] °[address] °[place], [place] [address] °[phone] ° °TOTAL KNEE REPLACEMENT POSTOPERATIVE DIRECTIONS ° ° ° °Knee Rehabilitation, Guidelines Following Surgery  °Results after knee surgery are often greatly improved when you follow the exercise, range of motion and muscle strengthening exercises prescribed by your doctor. Safety measures are also important to protect the knee from further injury. Any time any of these exercises cause you to have increased pain or swelling in your knee joint, decrease the amount until you are comfortable again and slowly increase them. If you have problems or questions, call your caregiver or physical therapist for advice.  ° °HOME CARE INSTRUCTIONS  °Remove items at home which could result in a fall. This includes throw rugs or furniture in walking pathways.  °Continue medications as instructed at time of discharge. °You may have some home medications which will be placed on hold until you complete the course of blood thinner medication.  °You may start showering once you are discharged home but do not submerge the incision under water. Just pat the incision dry and apply a dry gauze dressing on daily. °Walk with walker as instructed.  °You may resume a sexual relationship in one month or when given the OK by  your doctor.  °· Use walker as long as suggested by your caregivers. °· Avoid periods of inactivity such as sitting longer than an hour when not asleep. This helps prevent blood clots.  °You may put full weight on your legs and walk as much as is comfortable.  °You may return to work once you are cleared by your doctor.  °Do not drive a car for 6 weeks or until released by you surgeon.  °· Do not drive while taking narcotics.  °Wear the elastic stockings for three weeks following surgery during the day but you may remove then at night. °Make sure you keep all of your appointments after your  operation with all of your doctors and caregivers. You should call the office at the above phone number and make an appointment for approximately two weeks after the date of your surgery. °Change the dressing daily and reapply a dry dressing each time. °Please pick up a stool softener and laxative for home use as long as you are requiring pain medications. °· ICE to the affected knee every three hours for 30 minutes at a time and then as needed for pain and swelling.  Continue to use ice on the knee for pain and swelling from surgery. You may notice swelling that will progress down to the foot and ankle.  This is normal after surgery.  Elevate the leg when you are not up walking on it.   °It is important for you to complete the blood thinner medication as prescribed by your doctor. °· Continue to use the breathing machine which will help keep your temperature down.  It is common for your temperature to cycle up and down following surgery, especially at night when you are not up moving around and exerting yourself.  The breathing machine keeps your lungs expanded and your temperature down. ° °RANGE OF MOTION AND STRENGTHENING EXERCISES  °Rehabilitation of the knee is important following a knee injury or an operation. After just a few days of immobilization, the muscles of the thigh which control the knee become weakened and shrink (atrophy). Knee exercises are designed to build up the tone and strength of the thigh muscles and to improve knee   motion. Often times heat used for twenty to thirty minutes before working out will loosen up your tissues and help with improving the range of motion but do not use heat for the first two weeks following surgery. These exercises can be done on a training (exercise) mat, on the floor, on a table or on a bed. Use what ever works the best and is most comfortable for you Knee exercises include:  °Leg Lifts - While your knee is still immobilized in a splint or cast, you can do  straight leg raises. Lift the leg to 60 degrees, hold for 3 sec, and slowly lower the leg. Repeat 10-20 times 2-3 times daily. Perform this exercise against resistance later as your knee gets better.  °Quad and Hamstring Sets - Tighten up the muscle on the front of the thigh (Quad) and hold for 5-10 sec. Repeat this 10-20 times hourly. Hamstring sets are done by pushing the foot backward against an object and holding for 5-10 sec. Repeat as with quad sets.  °A rehabilitation program following serious knee injuries can speed recovery and prevent re-injury in the future due to weakened muscles. Contact your doctor or a physical therapist for more information on knee rehabilitation.  ° °SKILLED REHAB INSTRUCTIONS: °If the patient is transferred to a skilled rehab facility following release from the hospital, a list of the current medications will be sent to the facility for the patient to continue.  When discharged from the skilled rehab facility, please have the facility set up the patient's Home Health Physical Therapy prior to being released. Also, the skilled facility will be responsible for providing the patient with their medications at time of release from the facility to include their pain medication, the muscle relaxants, and their blood thinner medication. If the patient is still at the rehab facility at time of the two week follow up appointment, the skilled rehab facility will also need to assist the patient in arranging follow up appointment in our office and any transportation needs. ° °MAKE SURE YOU:  °Understand these instructions.  °Will watch your condition.  °Will get help right away if you are not doing well or get worse.  ° ° °Pick up stool softner and laxative for home use following surgery while on pain medications. °Do not submerge incision under water. °Please use good hand washing techniques while changing dressing each day. °May shower starting three days after surgery. °Please use a clean  towel to pat the incision dry following showers. °Continue to use ice for pain and swelling after surgery. °Do not use any lotions or creams on the incision until instructed by your surgeon. ° °Take Xarelto for two and a half more weeks, then discontinue Xarelto. °Once the patient has completed the blood thinner regimen, then take a Baby 81 mg Aspirin daily for three more weeks. ° °Postoperative Constipation Protocol ° °Constipation - defined medically as fewer than three stools per week and severe constipation as less than one stool per week. ° °One of the most common issues patients have following surgery is constipation.  Even if you have a regular bowel pattern at home, your normal regimen is likely to be disrupted due to multiple reasons following surgery.  Combination of anesthesia, postoperative narcotics, change in appetite and fluid intake all can affect your bowels.  In order to avoid complications following surgery, here are some recommendations in order to help you during your recovery period. ° °Colace (docusate) - Pick up an over-the-counter form of   Colace or another stool softener and take twice a day as long as you are requiring postoperative pain medications.  Take with a full glass of water daily.  If you experience loose stools or diarrhea, hold the colace until you stool forms back up.  If your symptoms do not get better within 1 week or if they get worse, check with your doctor. ° °Dulcolax (bisacodyl) - Pick up over-the-counter and take as directed by the product packaging as needed to assist with the movement of your bowels.  Take with a full glass of water.  Use this product as needed if not relieved by Colace only.  ° °MiraLax (polyethylene glycol) - Pick up over-the-counter to have on hand.  MiraLax is a solution that will increase the amount of water in your bowels to assist with bowel movements.  Take as directed and can mix with a glass of water, juice, soda, coffee, or tea.  Take if you  go more than two days without a movement. °Do not use MiraLax more than once per day. Call your doctor if you are still constipated or irregular after using this medication for 7 days in a row. ° °If you continue to have problems with postoperative constipation, please contact the office for further assistance and recommendations.  If you experience "the worst abdominal pain ever" or develop nausea or vomiting, please contact the office immediatly for further recommendations for treatment. ° ° °Information on my medicine - XARELTO® (Rivaroxaban) ° °This medication education was reviewed with me or my healthcare representative as part of my discharge preparation.  The pharmacist that spoke with me during my hospital stay was:  Zeigler, Dustin George, RPH ° °Why was Xarelto® prescribed for you? °Xarelto® was prescribed for you to reduce the risk of blood clots forming after orthopedic surgery. The medical term for these abnormal blood clots is venous thromboembolism (VTE). ° °What do you need to know about xarelto® ? °Take your Xarelto® ONCE DAILY at the same time every day. °You may take it either with or without food. ° °If you have difficulty swallowing the tablet whole, you may crush it and mix in applesauce just prior to taking your dose. ° °Take Xarelto® exactly as prescribed by your doctor and DO NOT stop taking Xarelto® without talking to the doctor who prescribed the medication.  Stopping without other VTE prevention medication to take the place of Xarelto® may increase your risk of developing a clot. ° °After discharge, you should have regular check-up appointments with your healthcare provider that is prescribing your Xarelto®.   ° °What do you do if you miss a dose? °If you miss a dose, take it as soon as you remember on the same day then continue your regularly scheduled once daily regimen the next day. Do not take two doses of Xarelto® on the same day.  ° °Important Safety Information °A possible side  effect of Xarelto® is bleeding. You should call your healthcare provider right away if you experience any of the following: °? Bleeding from an injury or your nose that does not stop. °? Unusual colored urine (red or dark brown) or unusual colored stools (red or black). °? Unusual bruising for unknown reasons. °? A serious fall or if you hit your head (even if there is no bleeding). ° °Some medicines may interact with Xarelto® and might increase your risk of bleeding while on Xarelto®. To help avoid this, consult your healthcare provider or pharmacist prior to using any new   prescription or non-prescription medications, including herbals, vitamins, non-steroidal anti-inflammatory drugs (NSAIDs) and supplements. ° °This website has more information on Xarelto®: www.xarelto.com. ° ° °

## 2014-05-09 NOTE — Progress Notes (Signed)
   Subjective: 1 Day Post-Op Procedure(s) (LRB): LEFT TOTAL KNEE ARTHROPLASTY (Left) Patient reports pain as mild and moderate.   Patient seen in rounds for Dr. Wynelle Link. Patient is well, but has had some minor complaints of pain in the knee, requiring pain medications We will start therapy today.  Plan is to go Home after hospital stay.  Objective: Vital signs in last 24 hours: Temp:  [97.4 F (36.3 C)-98.5 F (36.9 C)] 98.2 F (36.8 C) (12/15 1350) Pulse Rate:  [71-88] 88 (12/15 1350) Resp:  [16-18] 16 (12/15 1350) BP: (127-140)/(67-82) 127/78 mmHg (12/15 1350) SpO2:  [94 %-100 %] 94 % (12/15 1350)  Intake/Output from previous day:  Intake/Output Summary (Last 24 hours) at 05/09/14 1858 Last data filed at 05/09/14 1838  Gross per 24 hour  Intake   2010 ml  Output   1790 ml  Net    220 ml    Intake/Output this shift: Total I/O In: 720 [P.O.:720] Out: -   Labs:  Recent Labs  05/09/14 0453  HGB 12.9*    Recent Labs  05/09/14 0453  WBC 9.4  RBC 4.30  HCT 39.7  PLT 282    Recent Labs  05/09/14 0453  NA 137  K 4.2  CL 100  CO2 28  BUN 11  CREATININE 0.97  GLUCOSE 137*  CALCIUM 8.4   No results for input(s): LABPT, INR in the last 72 hours.  EXAM General - Patient is Alert, Appropriate and Oriented Extremity - Neurovascular intact Sensation intact distally Dorsiflexion/Plantar flexion intact Dressing - dressing C/D/I and no drainage Motor Function - intact, moving foot and toes well on exam.  Hemovac pulled without difficulty.  Past Medical History  Diagnosis Date  . History of nephrolithiasis     seen Dr. Olena [facility], last 02/2011  . Hypercholesterolemia   . ED (erectile dysfunction)   . AR (allergic rhinitis)   . Gout     per pt  . Complication of anesthesia     STATES SEVERE PAIN WAKING UP AFTER GENERAL ANESTHESIA; AND FEELS BAD.  Marland Kitchen GERD (gastroesophageal reflux disease)   . Arthritis     PAIN AND OA LEFT KNEE ;  S/P RIGHT TOTAL  KNEE REPLACEMENT  . RBBB (right bundle branch block)     Assessment/Plan: 1 Day Post-Op Procedure(s) (LRB): LEFT TOTAL KNEE ARTHROPLASTY (Left) Principal Problem:   OA (osteoarthritis) of knee  Estimated body mass index is 32.7 kg/(m^2) as calculated from the following:   Height as of this encounter: 5\' 8"  (1.727 m).   Weight as of this encounter: 97.523 kg (215 lb). Up with therapy Plan for discharge tomorrow Discharge home with home health  DVT Prophylaxis - Xarelto Weight-Bearing as tolerated to left leg D/C O2 and Pulse OX and try on Room Air  Arlee Muslim, PA-C Orthopaedic Surgery 05/09/2014, 6:58 PM

## 2014-05-09 NOTE — Discharge Summary (Signed)
Physician Discharge Summary   Patient ID: [name] MRN: [id number] DOB/AGE: 1946/04/03 68 y.o.  Admit date: 05/08/2014 Discharge date:   Primary Diagnosis: OA knee  Admission Diagnoses:  Past Medical History  Diagnosis Date  . History of nephrolithiasis     seen Dr. Garner [facility], last 02/2011  . Hypercholesterolemia   . ED (erectile dysfunction)   . AR (allergic rhinitis)   . Gout     per pt  . Complication of anesthesia     STATES SEVERE PAIN WAKING UP AFTER GENERAL ANESTHESIA; AND FEELS BAD.  Marland Kitchen GERD (gastroesophageal reflux disease)   . Arthritis     PAIN AND OA LEFT KNEE ;  S/P RIGHT TOTAL KNEE REPLACEMENT  . RBBB (right bundle branch block)    Discharge Diagnoses:   Principal Problem:   OA (osteoarthritis) of knee  Estimated body mass index is 32.7 kg/(m^2) as calculated from the following:   Height as of this encounter: 5\' 8"  (1.727 m).   Weight as of this encounter: 97.523 kg (215 lb).  Procedure:  Procedure(s) (LRB): LEFT TOTAL KNEE ARTHROPLASTY (Left)   Consults: None  HPI: The patient is a 68 year old male who presents for follow up of their knee. The patient is being followed for their left knee pain and osteoarthritis. They are now 10 month(s) out from last visit. Symptoms reported today include: pain (medial and lateral) and swelling. The patient feels that they are doing poorly. Current treatment includes: activity modification. The following medication has been used for pain control: Aleve (occasionally). Note for "Follow-up Knee": Patient states that he has been having increased pain. He is unable to climb stairs due to limited ROM. He is also limited to what he can do at the gym in the mornings. He was given a wedge in his shoe last year and that helped for about 2 weeks. He states that his knee is getting progressively worse over time. He had an acute worsening of his pain last week with increased swelling and increased pain. He feels that the  knee is at a stage now where it is as bad as the right knee was prior to him having it replaced. They have been treated conservatively in the past for the above stated problem and despite conservative measures, they continue to have progressive pain and severe functional limitations and dysfunction. They have failed non-operative management including home exercise, medications, and injections. It is felt that they would benefit from undergoing total joint replacement. Risks and benefits of the procedure have been discussed with the patient and they elect to proceed with surgery. There are no active contraindications to surgery such as ongoing infection or rapidly progressive neurological disease. Laboratory Data: Admission on 05/08/2014  Component Date Value Ref Range Status  . ABO/RH(D) 05/08/2014 O NEG   Final  . Antibody Screen 05/08/2014 NEG   Final  . Sample Expiration 05/08/2014 05/11/2014   Final  . WBC 05/09/2014 9.4  4.0 - 10.5 K/uL Final  . RBC 05/09/2014 4.30  4.22 - 5.81 MIL/uL Final  . Hemoglobin 05/09/2014 12.9* 13.0 - 17.0 g/dL Final  . HCT 05/11/2014 39.7  39.0 - 52.0 % Final  . MCV 05/09/2014 92.3  78.0 - 100.0 fL Final  . MCH 05/09/2014 30.0  26.0 - 34.0 pg Final  . MCHC 05/09/2014 32.5  30.0 - 36.0 g/dL Final  . RDW 05/11/2014 13.2  11.5 - 15.5 % Final  . Platelets 05/09/2014 282  150 - 400 K/uL  Final  . Sodium 05/09/2014 137  137 - 147 mEq/L Final  . Potassium 05/09/2014 4.2  3.7 - 5.3 mEq/L Final  . Chloride 05/09/2014 100  96 - 112 mEq/L Final  . CO2 05/09/2014 28  19 - 32 mEq/L Final  . Glucose, Bld 05/09/2014 137* 70 - 99 mg/dL Final  . BUN 19/11/719 11  6 - 23 mg/dL Final  . Creatinine, Ser 05/09/2014 0.97  0.50 - 1.35 mg/dL Final  . Calcium 71/16/5461 8.4  8.4 - 10.5 mg/dL Final  . GFR calc non Af Amer 05/09/2014 82* >90 mL/min Final  . GFR calc Af Amer 05/09/2014 >90  >90 mL/min Final   Comment: (NOTE) The eGFR has been calculated using the CKD EPI  equation. This calculation has not been validated in all clinical situations. eGFR's persistently <90 mL/min signify possible Chronic Kidney Disease.   Eustaquio Boyden gap 05/09/2014 9  5 - 15 Final  Hospital Outpatient Visit on 04/26/2014  Component Date Value Ref Range Status  . MRSA, PCR 04/26/2014 NEGATIVE  NEGATIVE Final  . Staphylococcus aureus 04/26/2014 POSITIVE* NEGATIVE Final   Comment:        The Xpert SA Assay (FDA approved for NASAL specimens in patients over 42 years of age), is one component of a comprehensive surveillance program.  Test performance has been validated by [employer] for patients greater than or equal to 39 year old. It is not intended to diagnose infection nor to guide or monitor treatment.   Marland Kitchen aPTT 04/26/2014 29  24 - 37 seconds Final  . WBC 04/26/2014 7.1  4.0 - 10.5 K/uL Final  . RBC 04/26/2014 4.75  4.22 - 5.81 MIL/uL Final  . Hemoglobin 04/26/2014 14.6  13.0 - 17.0 g/dL Final  . HCT 24/32/7556 41.5  39.0 - 52.0 % Final  . MCV 04/26/2014 87.4  78.0 - 100.0 fL Final  . MCH 04/26/2014 30.7  26.0 - 34.0 pg Final  . MCHC 04/26/2014 35.2  30.0 - 36.0 g/dL Final  . RDW 23/92/1515 12.8  11.5 - 15.5 % Final  . Platelets 04/26/2014 284  150 - 400 K/uL Final  . Sodium 04/26/2014 141  137 - 147 mEq/L Final  . Potassium 04/26/2014 3.9  3.7 - 5.3 mEq/L Final  . Chloride 04/26/2014 100  96 - 112 mEq/L Final  . CO2 04/26/2014 25  19 - 32 mEq/L Final  . Glucose, Bld 04/26/2014 94  70 - 99 mg/dL Final  . BUN 82/65/8718 14  6 - 23 mg/dL Final  . Creatinine, Ser 04/26/2014 0.90  0.50 - 1.35 mg/dL Final  . Calcium 41/12/5788 9.7  8.4 - 10.5 mg/dL Final  . Total Protein 04/26/2014 7.7  6.0 - 8.3 g/dL Final  . Albumin 79/31/0914 4.4  3.5 - 5.2 g/dL Final  . AST 56/06/7827 24  0 - 37 U/L Final  . ALT 04/26/2014 19  0 - 53 U/L Final  . Alkaline Phosphatase 04/26/2014 87  39 - 117 U/L Final  . Total Bilirubin 04/26/2014 0.7  0.3 - 1.2 mg/dL Final  . GFR calc non Af  Amer 04/26/2014 85* >90 mL/min Final  . GFR calc Af Amer 04/26/2014 >90  >90 mL/min Final   Comment: (NOTE) The eGFR has been calculated using the CKD EPI equation. This calculation has not been validated in all clinical situations. eGFR's persistently <90 mL/min signify possible Chronic Kidney Disease.   . Anion gap 04/26/2014 16* 5 - 15 Final  . Prothrombin Time 04/26/2014  13.0  11.6 - 15.2 seconds Final  . INR 04/26/2014 0.97  0.00 - 1.49 Final  . Color, Urine 04/26/2014 YELLOW  YELLOW Final  . APPearance 04/26/2014 CLEAR  CLEAR Final  . Specific Gravity, Urine 04/26/2014 1.008  1.005 - 1.030 Final  . pH 04/26/2014 6.5  5.0 - 8.0 Final  . Glucose, UA 04/26/2014 NEGATIVE  NEGATIVE mg/dL Final  . Hgb urine dipstick 04/26/2014 NEGATIVE  NEGATIVE Final  . Bilirubin Urine 04/26/2014 NEGATIVE  NEGATIVE Final  . Ketones, ur 04/26/2014 NEGATIVE  NEGATIVE mg/dL Final  . Protein, ur 04/26/2014 NEGATIVE  NEGATIVE mg/dL Final  . Urobilinogen, UA 04/26/2014 0.2  0.0 - 1.0 mg/dL Final  . Nitrite 04/26/2014 NEGATIVE  NEGATIVE Final  . Leukocytes, UA 04/26/2014 NEGATIVE  NEGATIVE Final   MICROSCOPIC NOT DONE ON URINES WITH NEGATIVE PROTEIN, BLOOD, LEUKOCYTES, NITRITE, OR GLUCOSE <1000 mg/dL.     X-Rays:Dg Chest 2 View  04/26/2014   CLINICAL DATA:  Preoperative exam prior to total knee joint replacement; asymptomatic ; no history of tobacco use  EXAM: CHEST  2 VIEW  COMPARISON:  PA and lateral chest of December 06, 2012  FINDINGS: The lungs are adequately inflated. There is no focal infiltrate. There is no pleural effusion. There is stable subtle parenchymal scarring on the left anteriorly in the upper lobe. The heart and pulmonary vascularity are normal. There is no pleural effusion or pneumothorax. The bony thorax exhibits no acute abnormality.  IMPRESSION: There is no active cardiopulmonary disease.   Electronically Signed   By: David  Martinique   On: 04/26/2014 13:56    EKG: Orders placed or performed  in visit on 01/18/14  . EKG 12-Lead     Hospital Course: [name] is a 68 y.o. who was admitted to [facility]. They were brought to the operating room on 05/08/2014 and underwent Procedure(s): LEFT TOTAL KNEE ARTHROPLASTY.  Patient tolerated the procedure well and was later transferred to the recovery room and then to the orthopaedic floor for postoperative care.  They were given PO and IV analgesics for pain control following their surgery.  They were given 24 hours of postoperative antibiotics of  Anti-infectives    Start     Dose/Rate Route Frequency Ordered Stop   05/08/14 1600  ceFAZolin (ANCEF) IVPB 2 g/50 mL premix     2 g100 mL/hr over 30 Minutes Intravenous Every 6 hours 05/08/14 1307 05/08/14 2147   05/08/14 0732  ceFAZolin (ANCEF) IVPB 2 g/50 mL premix     2 g100 mL/hr over 30 Minutes Intravenous On call to O.R. 05/08/14 0732 05/08/14 1027     and started on DVT prophylaxis in the form of Xarelto.   PT and OT were ordered for total joint protocol.  Discharge planning consulted to help with postop disposition and equipment needs.  Patient had a good night on the evening of surgery.  They started to get up OOB with therapy on day one. Hemovac drain was pulled without difficulty.  Continued to work with therapy into day two.  Dressing was changed on day two and the incision was clean without erythema or exudate.  By day three, the patient had progressed with therapy and meeting their goals.  Incision was healing well.  Patient was seen in rounds and was ready to go home.   Diet: Regular diet Activity:WBAT Follow-up:in 2 weeks Disposition - Home Discharged Condition: good   Discharge Instructions    Call MD / Call 911  Complete by:  As directed   If you experience chest pain or shortness of breath, CALL 911 and be transported to the hospital emergency room.  If you develope a fever above 101 F, pus (white drainage) or increased drainage or redness at the wound,  or calf pain, call your surgeon's office.     Change dressing    Complete by:  As directed   Change dressing daily with sterile 4 x 4 inch gauze dressing and apply TED hose. Do not submerge the incision under water.     Constipation Prevention    Complete by:  As directed   Drink plenty of fluids.  Prune juice may be helpful.  You may use a stool softener, such as Colace (over the counter) 100 mg twice a day.  Use MiraLax (over the counter) for constipation as needed.     Diet general    Complete by:  As directed      Discharge instructions    Complete by:  As directed   Pick up stool softner and laxative for home use following surgery while on pain medications. Do not submerge incision under water. Please use good hand washing techniques while changing dressing each day. May shower starting three days after surgery. Please use a clean towel to pat the incision dry following showers. Continue to use ice for pain and swelling after surgery. Do not use any lotions or creams on the incision until instructed by your surgeon.  Take Xarelto for two and a half more weeks, then discontinue Xarelto. Once the patient has completed the blood thinner regimen, then take a Baby 81 mg Aspirin daily for three more weeks.  Postoperative Constipation Protocol  Constipation - defined medically as fewer than three stools per week and severe constipation as less than one stool per week.  One of the most common issues patients have following surgery is constipation.  Even if you have a regular bowel pattern at home, your normal regimen is likely to be disrupted due to multiple reasons following surgery.  Combination of anesthesia, postoperative narcotics, change in appetite and fluid intake all can affect your bowels.  In order to avoid complications following surgery, here are some recommendations in order to help you during your recovery period.  Colace (docusate) - Pick up an over-the-counter form of Colace  or another stool softener and take twice a day as long as you are requiring postoperative pain medications.  Take with a full glass of water daily.  If you experience loose stools or diarrhea, hold the colace until you stool forms back up.  If your symptoms do not get better within 1 week or if they get worse, check with your doctor.  Dulcolax (bisacodyl) - Pick up over-the-counter and take as directed by the product packaging as needed to assist with the movement of your bowels.  Take with a full glass of water.  Use this product as needed if not relieved by Colace only.   MiraLax (polyethylene glycol) - Pick up over-the-counter to have on hand.  MiraLax is a solution that will increase the amount of water in your bowels to assist with bowel movements.  Take as directed and can mix with a glass of water, juice, soda, coffee, or tea.  Take if you go more than two days without a movement. Do not use MiraLax more than once per day. Call your doctor if you are still constipated or irregular after using this medication for 7 days in a  row.  If you continue to have problems with postoperative constipation, please contact the office for further assistance and recommendations.  If you experience "the worst abdominal pain ever" or develop nausea or vomiting, please contact the office immediatly for further recommendations for treatment.     Do not put a pillow under the knee. Place it under the heel.    Complete by:  As directed      Do not sit on low chairs, stoools or toilet seats, as it may be difficult to get up from low surfaces    Complete by:  As directed      Driving restrictions    Complete by:  As directed   No driving until released by the physician.     Increase activity slowly as tolerated    Complete by:  As directed      Lifting restrictions    Complete by:  As directed   No lifting until released by the physician.     Patient may shower    Complete by:  As directed   You may shower  without a dressing once there is no drainage.  Do not wash over the wound.  If drainage remains, do not shower until drainage stops.     TED hose    Complete by:  As directed   Use stockings (TED hose) for 3 weeks on both leg(s).  You may remove them at night for sleeping.     Weight bearing as tolerated    Complete by:  As directed             Medication List    STOP taking these medications        multivitamin tablet     naproxen sodium 220 MG tablet  Commonly known as:  ANAPROX     OVER THE COUNTER MEDICATION     VIAGRA 100 MG tablet  Generic drug:  sildenafil      TAKE these medications        acetaminophen 500 MG tablet  Commonly known as:  TYLENOL  Take 500 mg by mouth every 6 (six) hours as needed.     albuterol 108 (90 BASE) MCG/ACT inhaler  Commonly known as:  PROAIR HFA  Inhale 2 puffs into the lungs every 6 (six) hours as needed for wheezing or shortness of breath.     fluticasone 50 MCG/ACT nasal spray  Commonly known as:  FLONASE  USE 2 SPRAYS IN EACH NOSTRIL EVERY DAY AS DIRECTED     Garcinia Cambogia-Chromium 500-200 MG-MCG Tabs  Take 1 tablet by mouth daily.     methocarbamol 500 MG tablet  Commonly known as:  ROBAXIN  Take 1 tablet (500 mg total) by mouth every 6 (six) hours as needed for muscle spasms.     oxyCODONE 5 MG immediate release tablet  Commonly known as:  Oxy IR/ROXICODONE  Take 1-2 tablets (5-10 mg total) by mouth every 3 (three) hours as needed for moderate pain, severe pain or breakthrough pain.     pantoprazole 40 MG tablet  Commonly known as:  PROTONIX  Take 1 tablet (40 mg total) by mouth daily.     pravastatin 40 MG tablet  Commonly known as:  PRAVACHOL  Take 1 tablet (40 mg total) by mouth daily.     ranitidine 150 MG tablet  Commonly known as:  ZANTAC  Take 150 mg by mouth daily. IN AFTERNOON     rivaroxaban 10 MG Tabs tablet  Commonly  known as:  XARELTO  - Take 1 tablet (10 mg total) by mouth daily with breakfast.  Take Xarelto for two and a half more weeks, then discontinue Xarelto.  - Once the patient has completed the blood thinner regimen, then take a Baby 81 mg Aspirin daily for three more weeks.     traMADol 50 MG tablet  Commonly known as:  ULTRAM  Take 1-2 tablets (50-100 mg total) by mouth every 6 (six) hours as needed (mild pain).           Follow-up Information    Follow up with [facility].   Why:  home health physical therapy   Contact information:   [place] [address] [phone]       Follow up with [facility].   Why:  rolling walker   Contact information:   [address] [place] [address] [phone]       Follow up with Gearlean Alf, MD. Schedule an appointment as soon as possible for a visit in 2 weeks.   Specialty:  Orthopedic Surgery   Why:  Call office at [phone] to set up appointment the last week of December with Dr. Wynelle Link.   Contact information:   [address] [place] [address] [phone]       Signed: Arlee Muslim, PA-C Orthopaedic Surgery 05/09/2014, 8:02 PM

## 2014-05-09 NOTE — Evaluation (Signed)
Physical Therapy Evaluation Patient Details Name: [name] MRN: [id number] DOB: [birth year] Today's Date: 05/09/2014   History of Present Illness  s/p L TKA  Clinical Impression  Pt will benefit from PT to address deficits below; plan is for HHPT, has flight of stairs to enter home, will practice this tomorrow; needs RW    Follow Up Recommendations Home health PT    Equipment Recommendations  Rolling walker with 5" wheels    Recommendations for Other Services       Precautions / Restrictions Precautions Precautions: Fall;Knee Required Braces or Orthoses: Knee Immobilizer - Left Knee Immobilizer - Left: Discontinue once straight leg raise with < 10 degree lag      Mobility  Bed Mobility Overal bed mobility: Needs Assistance Bed Mobility: Supine to Sit     Supine to sit: Supervision     General bed mobility comments: cues for self assist, incr time  Transfers Overall transfer level: Needs assistance Equipment used: Rolling walker (2 wheeled) Transfers: Sit to/from Stand Sit to Stand: Min guard         General transfer comment: cues for hand placement  Ambulation/Gait Ambulation/Gait assistance: Min guard Ambulation Distance (Feet): 30 Feet Assistive device: Rolling walker (2 wheeled) Gait Pattern/deviations: Step-to pattern;Antalgic;Decreased step length - right;Decreased step length - left     General Gait Details: cues for RW distance from self, sequence  Stairs            Wheelchair Mobility    Modified Rankin (Stroke Patients Only)       Balance                                             Pertinent Vitals/Pain Pain Assessment: 0-10 Pain Score: 2  Pain Location: L knee Pain Descriptors / Indicators: Sore Pain Intervention(s): Limited activity within patient's tolerance;Monitored during session    Home Living Family/patient expects to be discharged to:: Private residence Living Arrangements:  Spouse/significant other Available Help at Discharge: Family Type of Home: House Home Access: Stairs to enter   [employer] of Steps: 16  from basement [employer]argage Home Layout: One level;Laundry or work area in [employer]: Bedside commode      Prior Function Level of Independence: Independent               Journalist, newspaper        Extremity/Trunk Assessment               Lower Extremity Assessment: LLE deficits/detail   LLE Deficits / Details: hip flexion and knee extension 3/5; knee AAROM -10 to 45* flexion     Communication   Communication: No difficulties  Cognition Arousal/Alertness: Awake/alert Behavior During Therapy: WFL for tasks assessed/performed Overall Cognitive Status: Within Functional Limits for tasks assessed                      General Comments      Exercises Total Joint Exercises Ankle Circles/Pumps: AROM;Both;10 reps Quad Sets: 10 reps;AROM;Both Heel Slides: AAROM;Left;10 reps      Assessment/Plan    PT Assessment Patient needs continued PT services  PT Diagnosis Difficulty walking   PT Problem List Decreased strength;Decreased range of motion;Decreased knowledge of use of DME  PT Treatment Interventions DME instruction;Gait training;Functional mobility training;Therapeutic activities;Therapeutic exercise;Patient/family education;Stair training   PT Goals (Current goals can be found  in the Care Plan section) Acute Rehab PT Goals Patient Stated Goal: return to playing golf, going to gym PT Goal Formulation: With patient Time For Goal Achievement: 05/12/14 Potential to Achieve Goals: Good    Frequency 7X/week   Barriers to discharge        Co-evaluation               End of Session Equipment Utilized During Treatment: Gait belt Activity Tolerance: Patient tolerated treatment well Patient left: in chair;with family/visitor present;with call bell/phone within reach Nurse Communication:  Mobility status         Time: 3335-4562 PT Time Calculation (min) (ACUTE ONLY): 24 min   Charges:   PT Evaluation $Initial PT Evaluation Tier I: 1 Procedure PT Treatments $Gait Training: 8-22 mins $Therapeutic Exercise: 8-22 mins   PT G Codes:          Elysabeth Aust 05-25-2014, 11:25 AM

## 2014-05-10 LAB — BASIC METABOLIC PANEL
ANION GAP: 11 (ref 5–15)
BUN: 12 mg/dL (ref 6–23)
CHLORIDE: 103 meq/L (ref 96–112)
CO2: 27 mEq/L (ref 19–32)
Calcium: 8.6 mg/dL (ref 8.4–10.5)
Creatinine, Ser: 0.95 mg/dL (ref 0.50–1.35)
GFR calc Af Amer: >90 mL/min (ref >90)
GFR, EST NON AFRICAN AMERICAN: 83 mL/min — AB (ref >90)
Glucose, Bld: 141 mg/dL — ABNORMAL HIGH (ref 70–99)
POTASSIUM: 4.4 meq/L (ref 3.7–5.3)
SODIUM: 141 meq/L (ref 137–147)

## 2014-05-10 LAB — CBC
HEMATOCRIT: 36.4 % — AB (ref 39.0–52.0)
Hemoglobin: 12.1 g/dL — ABNORMAL LOW (ref 13.0–17.0)
MCH: 30.5 pg (ref 26.0–34.0)
MCHC: 33.2 g/dL (ref 30.0–36.0)
MCV: 91.7 fL (ref 78.0–100.0)
Platelets: 239 10*3/uL (ref 150–400)
RBC: 3.97 MIL/uL — ABNORMAL LOW (ref 4.22–5.81)
RDW: 13.1 % (ref 11.5–15.5)
WBC: 10.4 10*3/uL (ref 4.0–10.5)

## 2014-05-10 NOTE — Progress Notes (Signed)
   Subjective: 2 Days Post-Op Procedure(s) (LRB): LEFT TOTAL KNEE ARTHROPLASTY (Left) Patient reports pain as mild.   Patient seen in rounds with Dr. Wynelle Link.  Wife in room. Patient is well, and has had no acute complaints or problems Patient is ready to go home  Objective: Vital signs in last 24 hours: Temp:  [97.4 F (36.3 C)-98.6 F (37 C)] 98.1 F (36.7 C) (12/16 0534) Pulse Rate:  [84-102] 102 (12/16 0534) Resp:  [16-18] 16 (12/16 0534) BP: (127-146)/(61-82) 128/75 mmHg (12/16 0534) SpO2:  [94 %-99 %] 96 % (12/16 0534)  Intake/Output from previous day:  Intake/Output Summary (Last 24 hours) at 05/10/14 0821 Last data filed at 05/10/14 0743  Gross per 24 hour  Intake   1080 ml  Output    900 ml  Net    180 ml    Intake/Output this shift: Total I/O In: -  Out: 400 [Urine:400]  Labs:  Recent Labs  05/09/14 0453 05/10/14 0430  HGB 12.9* 12.1*    Recent Labs  05/09/14 0453 05/10/14 0430  WBC 9.4 10.4  RBC 4.30 3.97*  HCT 39.7 36.4*  PLT 282 239    Recent Labs  05/09/14 0453 05/10/14 0430  NA 137 141  K 4.2 4.4  CL 100 103  CO2 28 27  BUN 11 12  CREATININE 0.97 0.95  GLUCOSE 137* 141*  CALCIUM 8.4 8.6   No results for input(s): LABPT, INR in the last 72 hours.  EXAM: General - Patient is Alert, Appropriate and Oriented Extremity - Neurovascular intact Sensation intact distally Dorsiflexion/Plantar flexion intact Incision - clean, dry, no drainage, healing Motor Function - intact, moving foot and toes well on exam.   Assessment/Plan: 2 Days Post-Op Procedure(s) (LRB): LEFT TOTAL KNEE ARTHROPLASTY (Left) Procedure(s) (LRB): LEFT TOTAL KNEE ARTHROPLASTY (Left) Past Medical History  Diagnosis Date  . History of nephrolithiasis     seen Dr. [facility], last 02/2011  . Hypercholesterolemia   . ED (erectile dysfunction)   . AR (allergic rhinitis)   . Gout     per pt  . Complication of anesthesia     STATES SEVERE PAIN WAKING UP  AFTER GENERAL ANESTHESIA; AND FEELS BAD.  Marland Kitchen GERD (gastroesophageal reflux disease)   . Arthritis     PAIN AND OA LEFT KNEE ;  S/P RIGHT TOTAL KNEE REPLACEMENT  . RBBB (right bundle branch block)    Principal Problem:   OA (osteoarthritis) of knee  Estimated body mass index is 32.7 kg/(m^2) as calculated from the following:   Height as of this encounter: 5\' 8"  (1.727 m).   Weight as of this encounter: 97.523 kg (215 lb). Advance diet Up with therapy Discharge home with home health Diet - Cardiac diet Follow up - in 2 weeks Activity - WBAT Disposition - Home Condition Upon Discharge - Good D/C Meds - See DC Summary DVT Prophylaxis - Xarelto  Arlee Muslim, PA-C Orthopaedic Surgery 05/10/2014, 8:21 AM

## 2014-05-10 NOTE — Progress Notes (Signed)
Physical Therapy Treatment Patient Details Name: [name] MRN: [id number] DOB: [birth year] Today's Date: 05/10/2014    History of Present Illness s/p L TKA    PT Comments    Pt doing well; reviewed car tranfers and toilet transfers verbally as well; pt and wife feel comfortable    Follow Up Recommendations  Home health PT     Equipment Recommendations  Rolling walker with 5" wheels    Recommendations for Other Services       Precautions / Restrictions Precautions Precautions: Fall;Knee Required Braces or Orthoses: Knee Immobilizer - Left Knee Immobilizer - Left: Discontinue once straight leg raise with < 10 degree lag Restrictions Other Position/Activity Restrictions: I SLR, KI not used    Mobility  Bed Mobility Overal bed mobility: Needs Assistance Bed Mobility: Supine to Sit;Sit to Supine     Supine to sit: Supervision Sit to supine: Supervision   General bed mobility comments: cues for self assist, incr time  Transfers Overall transfer level: Needs assistance Equipment used: Rolling walker (2 wheeled) Transfers: Sit to/from Stand Sit to Stand: Supervision         General transfer comment: pt self corrects for hand placement  Ambulation/Gait Ambulation/Gait assistance: Supervision Ambulation Distance (Feet): 80 Feet Assistive device: Rolling walker (2 wheeled) Gait Pattern/deviations: Step-to pattern;Antalgic;Decreased step length - right;Decreased step length - left;Trunk flexed Gait velocity: decr   General Gait Details: cues for posture and position from RW   Stairs Stairs: Yes Stairs assistance: Supervision Stair Management: One rail Right;Step to pattern;With crutches;Forwards Number of Stairs: 5 (x2) General stair comments: cues for sequence  Wheelchair Mobility    Modified Rankin (Stroke Patients Only)       Balance                                    Cognition Arousal/Alertness: Awake/alert Behavior  During Therapy: WFL for tasks assessed/performed Overall Cognitive Status: Within Functional Limits for tasks assessed                      Exercises Total Joint Exercises Ankle Circles/Pumps: AROM;Both;10 reps Quad Sets: 10 reps;AROM;Both Short Arc QuadSinclair Ship;Left;10 reps Heel Slides: AAROM;Left;15 reps;Supine (using sheet to self assist) Hip ABduction/ADduction: AROM;AAROM;Left;15 reps Straight Leg Raises: AAROM;AROM;Left;15 reps;Supine Goniometric ROM: grossly -10 to 50*; incr muscle guarding    General Comments        Pertinent Vitals/Pain Pain Assessment: 0-10 Pain Score: 6  Pain Location: L knee with activity Pain Descriptors / Indicators: Aching Pain Intervention(s): Limited activity within patient's tolerance;Monitored during session;RN gave pain meds during session;Premedicated before session;Ice applied    Home Living                      Prior Function            PT Goals (current goals can now be found in the care plan section) Acute Rehab PT Goals Patient Stated Goal: return to playing golf, going to gym PT Goal Formulation: With patient Time For Goal Achievement: 05/12/14 Potential to Achieve Goals: Good Progress towards PT goals: Progressing toward goals    Frequency  7X/week    PT Plan Current plan remains appropriate    Co-evaluation             End of Session Equipment Utilized During Treatment: Gait belt Activity Tolerance: Patient tolerated treatment well Patient left: in bed;with  call bell/phone within reach;with family/visitor present     Time: 0922-1000 PT Time Calculation (min) (ACUTE ONLY): 38 min  Charges:  $Gait Training: 23-37 mins $Therapeutic Exercise: 8-22 mins                    G Codes:      [name] 05-12-14, 10:15 AM

## 2014-06-01 ENCOUNTER — Other Ambulatory Visit: Payer: Self-pay | Admitting: Family Medicine

## 2014-06-02 ENCOUNTER — Other Ambulatory Visit: Payer: Self-pay | Admitting: Family Medicine

## 2014-06-11 ENCOUNTER — Other Ambulatory Visit: Payer: Self-pay | Admitting: Family Medicine

## 2014-06-11 DIAGNOSIS — R7301 Impaired fasting glucose: Secondary | ICD-10-CM

## 2014-06-11 DIAGNOSIS — E7800 Pure hypercholesterolemia, unspecified: Secondary | ICD-10-CM

## 2014-06-11 DIAGNOSIS — Z125 Encounter for screening for malignant neoplasm of prostate: Secondary | ICD-10-CM

## 2014-06-14 ENCOUNTER — Other Ambulatory Visit (INDEPENDENT_AMBULATORY_CARE_PROVIDER_SITE_OTHER): Payer: 59

## 2014-06-14 DIAGNOSIS — E7800 Pure hypercholesterolemia, unspecified: Secondary | ICD-10-CM

## 2014-06-14 DIAGNOSIS — Z125 Encounter for screening for malignant neoplasm of prostate: Secondary | ICD-10-CM

## 2014-06-14 DIAGNOSIS — R7301 Impaired fasting glucose: Secondary | ICD-10-CM

## 2014-06-14 LAB — LIPID PANEL
Cholesterol: 170 mg/dL (ref 0–200)
HDL: 41.2 mg/dL (ref >39.00)
LDL Cholesterol: 98 mg/dL (ref 0–99)
NONHDL: 128.8
Total CHOL/HDL Ratio: 4
Triglycerides: 153 mg/dL — ABNORMAL HIGH (ref 0.0–149.0)
VLDL: 30.6 mg/dL (ref 0.0–40.0)

## 2014-06-14 LAB — HEMOGLOBIN A1C: Hgb A1c MFr Bld: 5.5 % (ref 4.6–6.5)

## 2014-06-14 LAB — PSA, MEDICARE: PSA: 1.83 ng/mL (ref 0.10–4.00)

## 2014-06-21 ENCOUNTER — Ambulatory Visit (INDEPENDENT_AMBULATORY_CARE_PROVIDER_SITE_OTHER): Payer: 59 | Admitting: Family Medicine

## 2014-06-21 ENCOUNTER — Encounter: Payer: Self-pay | Admitting: Family Medicine

## 2014-06-21 VITALS — BP 136/84 | HR 88 | Temp 98.1°F | Ht 67.5 in | Wt 217.5 lb

## 2014-06-21 DIAGNOSIS — E7800 Pure hypercholesterolemia, unspecified: Secondary | ICD-10-CM

## 2014-06-21 DIAGNOSIS — M1712 Unilateral primary osteoarthritis, left knee: Secondary | ICD-10-CM

## 2014-06-21 DIAGNOSIS — Z0000 Encounter for general adult medical examination without abnormal findings: Secondary | ICD-10-CM

## 2014-06-21 DIAGNOSIS — Z23 Encounter for immunization: Secondary | ICD-10-CM

## 2014-06-21 DIAGNOSIS — K219 Gastro-esophageal reflux disease without esophagitis: Secondary | ICD-10-CM

## 2014-06-21 DIAGNOSIS — Z7189 Other specified counseling: Secondary | ICD-10-CM

## 2014-06-21 MED ORDER — PANTOPRAZOLE SODIUM 40 MG PO TBEC: 40.0000 mg | DELAYED_RELEASE_TABLET | Freq: Every day | ORAL | Status: DC

## 2014-06-21 NOTE — Assessment & Plan Note (Signed)
Doing remarkably well after recent L knee replacement, continues PT.

## 2014-06-21 NOTE — Progress Notes (Signed)
Pre visit review using our clinic review tool, if applicable. No additional management support is needed unless otherwise documented below in the visit note. 

## 2014-06-21 NOTE — Assessment & Plan Note (Signed)
Chronic, stable. Continue pravastatin. 

## 2014-06-21 NOTE — Assessment & Plan Note (Signed)
Advanced directives - son Jerimyah Vandunk) would be [facility] proxy.Has living will at home.

## 2014-06-21 NOTE — Progress Notes (Signed)
BP 136/84 mmHg  Pulse 88  Temp(Src) 98.1 F (36.7 C) (Oral)  Ht 5' 7.5" (1.715 m)  Wt 217 lb 8 oz (98.657 kg)  BMI 33.54 kg/m2   CC: medicare wellness visit  Subjective:    Patient ID: [name], male    DOB: 1945-10-10, 69 y.o.   MRN: [id number]  HPI: [name] is a 69 y.o. male presenting on 06/21/2014 for Annual Exam   TKR - L by Dr [name] 04/2014. Doing very well! Continues PT at [facility].   Passes hearing screen today.  Vision screen scheduled for next week with eye doctor. No falls in last year.  No anhedonia, denies depression   Preventative: Colonoscopy 2008 - normal, rpt 10 yrs [name], [name])  Prostate - yearly. Always normal. Flu 02/2014 Pneumovax 05/2010, prevnar today Td 07/2009 Shingles - may consider later in year. Advanced directives - son [name]) would be [facility] proxy.Has living will at home.  Caffeine: occaisonally, 2 diet cokes/day Married, lives with wife Retired: Development worker, community Activity: Walks and runs 3 miles a day. Started lifting weights. Stays active golfing. Diet: good water, fruits/vegetables daily, avoiding carbs and watching portion sizes  Relevant past medical, surgical, family and social history reviewed and updated as indicated. Interim medical history since our last visit reviewed. Allergies and medications reviewed and updated. Current Outpatient Prescriptions on File Prior to Visit  Medication Sig  . acetaminophen (TYLENOL) 500 MG tablet Take 500 mg by mouth every 6 (six) hours as needed.  Marland Kitchen albuterol (PROAIR HFA) 108 (90 BASE) MCG/ACT inhaler Inhale 2 puffs into the lungs every 6 (six) hours as needed for wheezing or shortness of breath.  . fluticasone (FLONASE) 50 MCG/ACT nasal spray USE 2 SPRAYS IN EACH NOSTRIL EVERY DAY AS DIRECTED (Patient taking differently: USE 2 SPRAYS IN EACH NOSTRIL EVERY DAY AS NEEDED FOR ALLERGIES.)  . methocarbamol (ROBAXIN) 500 MG tablet Take 1 tablet (500 mg total) by mouth every  6 (six) hours as needed for muscle spasms.  Marland Kitchen oxyCODONE (OXY IR/ROXICODONE) 5 MG immediate release tablet Take 1-2 tablets (5-10 mg total) by mouth every 3 (three) hours as needed for moderate pain, severe pain or breakthrough pain.  . pravastatin (PRAVACHOL) 40 MG tablet TAKE 1 TABLET BY MOUTH DAILY  . traMADol (ULTRAM) 50 MG tablet Take 1-2 tablets (50-100 mg total) by mouth every 6 (six) hours as needed (mild pain).   No current facility-administered medications on file prior to visit.    Review of Systems Per HPI unless specifically indicated above     Objective:    BP 136/84 mmHg  Pulse 88  Temp(Src) 98.1 F (36.7 C) (Oral)  Ht 5' 7.5" (1.715 m)  Wt 217 lb 8 oz (98.657 kg)  BMI 33.54 kg/m2  Wt Readings from Last 3 Encounters:  06/21/14 217 lb 8 oz (98.657 kg)  05/08/14 215 lb (97.523 kg)  05/04/14 215 lb (97.523 kg)    Physical Exam  Constitutional: He is oriented to person, place, and time. He appears well-developed and well-nourished. No distress.  HENT:  Head: Normocephalic and atraumatic.  Right Ear: Hearing, tympanic membrane, external ear and ear canal normal.  Left Ear: Hearing, tympanic membrane, external ear and ear canal normal.  Nose: Nose normal.  Mouth/Throat: Uvula is midline, oropharynx is clear and moist and mucous membranes are normal. No oropharyngeal exudate, posterior oropharyngeal edema or posterior oropharyngeal erythema.  Eyes: Conjunctivae and EOM are normal. Pupils are equal, round, and reactive to light.  No scleral icterus.  Neck: Normal range of motion. Neck supple. Carotid bruit is not present. No thyromegaly present.  Cardiovascular: Normal rate, regular rhythm, normal heart sounds and intact distal pulses.   No murmur heard. Pulses:      Radial pulses are 2+ on the right side, and 2+ on the left side.  Pulmonary/Chest: Effort normal and breath sounds normal. No respiratory distress. He has no wheezes. He has no rales.  Abdominal: Soft. Bowel  sounds are normal. He exhibits no distension and no mass. There is no tenderness. There is no rebound and no guarding.  Genitourinary: Rectum normal and prostate normal. Rectal exam shows no external hemorrhoid, no internal hemorrhoid, no fissure, no mass, no tenderness and anal tone normal. Prostate is not enlarged (20gm) and not tender.  Musculoskeletal: Normal range of motion. He exhibits no edema.  Lymphadenopathy:    He has no cervical adenopathy.  Neurological: He is alert and oriented to person, place, and time.  CN grossly intact, station and gait intact Recall 3/3  Calculation 5/5 serial 7s  Skin: Skin is warm and dry. No rash noted.  Psychiatric: He has a normal mood and affect. His behavior is normal. Judgment and thought content normal.  Nursing note and vitals reviewed.  Results for orders placed or performed in visit on 06/14/14  Hemoglobin A1c  Result Value Ref Range   Hgb A1c MFr Bld 5.5 4.6 - 6.5 %  Lipid panel  Result Value Ref Range   Cholesterol 170 0 - 200 mg/dL   Triglycerides 153.0 (H) 0.0 - 149.0 mg/dL   HDL 41.20 >39.00 mg/dL   VLDL 30.6 0.0 - 40.0 mg/dL   LDL Cholesterol 98 0 - 99 mg/dL   Total CHOL/HDL Ratio 4    NonHDL 128.80   PSA, Medicare  Result Value Ref Range   PSA 1.83 0.10 - 4.00 ng/ml      Assessment & Plan:   Problem List Items Addressed This Visit    OA (osteoarthritis) of knee    Doing remarkably well after recent L knee replacement, continues PT.      Relevant Medications   aspirin 81 MG tablet   Medicare annual wellness visit, subsequent - Primary    I have personally reviewed the Medicare Annual Wellness questionnaire and have noted 1. The patient's medical and social history 2. Their use of alcohol, tobacco or illicit drugs 3. Their current medications and supplements 4. The patient's functional ability including ADL's, fall risks, home safety risks and hearing or visual impairment. 5. Diet and physical activity 6. Evidence  for depression or mood disorders The patients weight, height, BMI have been recorded in the chart.  Hearing and vision has been addressed. I have made referrals, counseling and provided education to the patient based review of the above and I have provided the pt with a written personalized care plan for preventive services. Provider list updated - see scanned questionairre. Reviewed preventative protocols and updated unless pt declined.       HYPERCHOLESTEROLEMIA    Chronic, stable. Continue pravastatin.      Relevant Medications   aspirin 81 MG tablet   sildenafil (VIAGRA) 100 MG tablet   GERD (gastroesophageal reflux disease)    Doing well on protonix 40mg  daily. Discussed use. May try QOD Dosing      Relevant Medications   pantoprazole (PROTONIX) EC tablet   Advanced care planning/counseling discussion    Advanced directives - son Caroll Cunnington) would be [facility] proxy.Has  living will at home.          Follow up plan: Return in about 1 year (around 06/22/2015), or as needed, for medicare wellness.

## 2014-06-21 NOTE — Addendum Note (Signed)
Addended by: Royann Shivers A on: 06/21/2014 10:02 AM   Modules accepted: Orders

## 2014-06-21 NOTE — Patient Instructions (Addendum)
Prevnar today. Bring [place] a copy of living will. I've refilled protonix. Good to see you today, call us with questions. Return as needed or in 1 year for next wellness exam.

## 2014-06-21 NOTE — Assessment & Plan Note (Signed)
Doing well on protonix 40mg  daily. Discussed use. May try QOD Dosing

## 2014-06-21 NOTE — Assessment & Plan Note (Signed)

## 2014-08-28 ENCOUNTER — Other Ambulatory Visit: Payer: Self-pay | Admitting: Family Medicine

## 2014-10-18 ENCOUNTER — Other Ambulatory Visit: Payer: Self-pay | Admitting: Family Medicine

## 2014-11-08 ENCOUNTER — Other Ambulatory Visit: Payer: Self-pay | Admitting: Family Medicine

## 2015-03-12 ENCOUNTER — Ambulatory Visit (INDEPENDENT_AMBULATORY_CARE_PROVIDER_SITE_OTHER): Payer: 59

## 2015-03-12 DIAGNOSIS — Z23 Encounter for immunization: Secondary | ICD-10-CM | POA: Diagnosis not present

## 2015-05-14 ENCOUNTER — Other Ambulatory Visit: Payer: Self-pay | Admitting: Family Medicine

## 2015-06-26 ENCOUNTER — Other Ambulatory Visit: Payer: Self-pay | Admitting: Family Medicine

## 2015-06-26 ENCOUNTER — Other Ambulatory Visit (INDEPENDENT_AMBULATORY_CARE_PROVIDER_SITE_OTHER): Payer: 59

## 2015-06-26 DIAGNOSIS — Z125 Encounter for screening for malignant neoplasm of prostate: Secondary | ICD-10-CM | POA: Diagnosis not present

## 2015-06-26 DIAGNOSIS — E7800 Pure hypercholesterolemia, unspecified: Secondary | ICD-10-CM | POA: Diagnosis not present

## 2015-06-26 DIAGNOSIS — D649 Anemia, unspecified: Secondary | ICD-10-CM

## 2015-06-26 DIAGNOSIS — Z1159 Encounter for screening for other viral diseases: Secondary | ICD-10-CM

## 2015-06-26 LAB — BASIC METABOLIC PANEL
BUN: 16 mg/dL (ref 6–23)
CALCIUM: 9.6 mg/dL (ref 8.4–10.5)
CO2: 28 mEq/L (ref 19–32)
Chloride: 104 mEq/L (ref 96–112)
Creatinine, Ser: 1.11 mg/dL (ref 0.40–1.50)
GFR: 69.46 mL/min (ref >60.00)
Glucose, Bld: 103 mg/dL — ABNORMAL HIGH (ref 70–99)
POTASSIUM: 4.1 meq/L (ref 3.5–5.1)
SODIUM: 140 meq/L (ref 135–145)

## 2015-06-26 LAB — CBC WITH DIFFERENTIAL/PLATELET
BASOS ABS: 0 10*3/uL (ref 0.0–0.1)
Basophils Relative: 0.4 % (ref 0.0–3.0)
EOS ABS: 0.5 10*3/uL (ref 0.0–0.7)
Eosinophils Relative: 6.8 % — ABNORMAL HIGH (ref 0.0–5.0)
HEMATOCRIT: 43.9 % (ref 39.0–52.0)
Hemoglobin: 14.7 g/dL (ref 13.0–17.0)
LYMPHS ABS: 2 10*3/uL (ref 0.7–4.0)
LYMPHS PCT: 28.8 % (ref 12.0–46.0)
MCHC: 33.5 g/dL (ref 30.0–36.0)
MCV: 90.2 fl (ref 78.0–100.0)
MONOS PCT: 9.1 % (ref 3.0–12.0)
Monocytes Absolute: 0.6 10*3/uL (ref 0.1–1.0)
NEUTROS PCT: 54.9 % (ref 43.0–77.0)
Neutro Abs: 3.9 10*3/uL (ref 1.4–7.7)
Platelets: 285 10*3/uL (ref 150.0–400.0)
RBC: 4.87 Mil/uL (ref 4.22–5.81)
RDW: 13.6 % (ref 11.5–15.5)
WBC: 7.1 10*3/uL (ref 4.0–10.5)

## 2015-06-26 LAB — PSA, MEDICARE: PSA: 0.61 ng/mL (ref 0.10–4.00)

## 2015-06-26 LAB — LIPID PANEL
CHOL/HDL RATIO: 4
Cholesterol: 161 mg/dL (ref 0–200)
HDL: 41.8 mg/dL (ref >39.00)
LDL CALC: 92 mg/dL (ref 0–99)
NonHDL: 119.64
Triglycerides: 140 mg/dL (ref 0.0–149.0)
VLDL: 28 mg/dL (ref 0.0–40.0)

## 2015-06-26 NOTE — Addendum Note (Signed)
Addended by: Marchia Bond on: 06/26/2015 01:28 PM   Modules accepted: Orders

## 2015-06-28 LAB — HEPATITIS C ANTIBODY: HCV Ab: NEGATIVE

## 2015-06-29 ENCOUNTER — Ambulatory Visit (INDEPENDENT_AMBULATORY_CARE_PROVIDER_SITE_OTHER): Payer: 59 | Admitting: Family Medicine

## 2015-06-29 ENCOUNTER — Encounter: Payer: Self-pay | Admitting: Family Medicine

## 2015-06-29 VITALS — BP 124/74 | HR 68 | Temp 98.0°F | Wt 220.5 lb

## 2015-06-29 DIAGNOSIS — Z0000 Encounter for general adult medical examination without abnormal findings: Secondary | ICD-10-CM

## 2015-06-29 DIAGNOSIS — Z7189 Other specified counseling: Secondary | ICD-10-CM

## 2015-06-29 DIAGNOSIS — L570 Actinic keratosis: Secondary | ICD-10-CM

## 2015-06-29 DIAGNOSIS — R7301 Impaired fasting glucose: Secondary | ICD-10-CM

## 2015-06-29 DIAGNOSIS — E7800 Pure hypercholesterolemia, unspecified: Secondary | ICD-10-CM

## 2015-06-29 DIAGNOSIS — E669 Obesity, unspecified: Secondary | ICD-10-CM | POA: Insufficient documentation

## 2015-06-29 DIAGNOSIS — K219 Gastro-esophageal reflux disease without esophagitis: Secondary | ICD-10-CM

## 2015-06-29 NOTE — Patient Instructions (Addendum)
Shingles shot today Bring me copy of advanced directive Try cream on ear and forearms - if no better return for treatment. You are doing well today Return as needed or in 1 year for next physical.  Health Maintenance, Male A healthy lifestyle and preventative care can promote health and wellness.  Maintain regular health, dental, and eye exams.  Eat a healthy diet. Foods like vegetables, fruits, whole grains, low-fat dairy products, and lean protein foods contain the nutrients you need and are low in calories. Decrease your intake of foods high in solid fats, added sugars, and salt. Get information about a proper diet from your health care provider, if necessary.  Regular physical exercise is one of the most important things you can do for your health. Most adults should get at least 150 minutes of moderate-intensity exercise (any activity that increases your heart rate and causes you to sweat) each week. In addition, most adults need muscle-strengthening exercises on 2 or more days a week.   Maintain a healthy weight. The body mass index (BMI) is a screening tool to identify possible weight problems. It provides an estimate of body fat based on height and weight. Your health care provider can find your BMI and can help you achieve or maintain a healthy weight. For males 20 years and older:  A BMI below 18.5 is considered underweight.  A BMI of 18.5 to 24.9 is normal.  A BMI of 25 to 29.9 is considered overweight.  A BMI of 30 and above is considered obese.  Maintain normal blood lipids and cholesterol by exercising and minimizing your intake of saturated fat. Eat a balanced diet with plenty of fruits and vegetables. Blood tests for lipids and cholesterol should begin at age 44 and be repeated every 5 years. If your lipid or cholesterol levels are high, you are over age 39, or you are at high risk for heart disease, you may need your cholesterol levels checked more frequently.Ongoing high  lipid and cholesterol levels should be treated with medicines if diet and exercise are not working.  If you smoke, find out from your health care provider how to quit. If you do not use tobacco, do not start.  Lung cancer screening is recommended for adults aged 77-80 years who are at high risk for developing lung cancer because of a history of smoking. A yearly low-dose CT scan of the lungs is recommended for people who have at least a 30-pack-year history of smoking and are current smokers or have quit within the past 15 years. A pack year of smoking is smoking an average of 1 pack of cigarettes a day for 1 year (for example, a 30-pack-year history of smoking could mean smoking 1 pack a day for 30 years or 2 packs a day for 15 years). Yearly screening should continue until the smoker has stopped smoking for at least 15 years. Yearly screening should be stopped for people who develop a health problem that would prevent them from having lung cancer treatment.  If you choose to drink alcohol, do not have more than 2 drinks per day. One drink is considered to be 12 oz (360 mL) of beer, 5 oz (150 mL) of wine, or 1.5 oz (45 mL) of liquor.  Avoid the use of street drugs. Do not share needles with anyone. Ask for help if you need support or instructions about stopping the use of drugs.  High blood pressure causes heart disease and increases the risk of stroke. High  blood pressure is more likely to develop in:  People who have blood pressure in the end of the normal range (100-139/85-89 mm Hg).  People who are overweight or obese.  People who are African American.  If you are 68-17 years of age, have your blood pressure checked every 3-5 years. If you are 1 years of age or older, have your blood pressure checked every year. You should have your blood pressure measured twice--once when you are at a hospital or clinic, and once when you are not at a hospital or clinic. Record the average of the two  measurements. To check your blood pressure when you are not at a hospital or clinic, you can use:  An automated blood pressure machine at a pharmacy.  A home blood pressure monitor.  If you are 8-29 years old, ask your health care provider if you should take aspirin to prevent heart disease.  Diabetes screening involves taking a blood sample to check your fasting blood sugar level. This should be done once every 3 years after age 47 if you are at a normal weight and without risk factors for diabetes. Testing should be considered at a younger age or be carried out more frequently if you are overweight and have at least 1 risk factor for diabetes.  Colorectal cancer can be detected and often prevented. Most routine colorectal cancer screening begins at the age of 10 and continues through age 38. However, your health care provider may recommend screening at an earlier age if you have risk factors for colon cancer. On a yearly basis, your health care provider may provide home test kits to check for hidden blood in the stool. A small camera at the end of a tube may be used to directly examine the colon (sigmoidoscopy or colonoscopy) to detect the earliest forms of colorectal cancer. Talk to your health care provider about this at age 18 when routine screening begins. A direct exam of the colon should be repeated every 5-10 years through age 22, unless early forms of precancerous polyps or small growths are found.  People who are at an increased risk for hepatitis B should be screened for this virus. You are considered at high risk for hepatitis B if:  You were born in a country where hepatitis B occurs often. Talk with your health care provider about which countries are considered high risk.  Your parents were born in a high-risk country and you have not received a shot to protect against hepatitis B (hepatitis B vaccine).  You have HIV or AIDS.  You use needles to inject street drugs.  You live  with, or have sex with, someone who has hepatitis B.  You are a man who has sex with other men (MSM).  You get hemodialysis treatment.  You take certain medicines for conditions like cancer, organ transplantation, and autoimmune conditions.  Hepatitis C blood testing is recommended for all people born from [birth year] through 1965 and any individual with known risk factors for hepatitis C.  Healthy men should no longer receive prostate-specific antigen (PSA) blood tests as part of routine cancer screening. Talk to your health care provider about prostate cancer screening.  Testicular cancer screening is not recommended for adolescents or adult males who have no symptoms. Screening includes self-exam, a health care provider exam, and other screening tests. Consult with your health care provider about any symptoms you have or any concerns you have about testicular cancer.  Practice safe sex. Use condoms and  avoid high-risk sexual practices to reduce the spread of sexually transmitted infections (STIs).  You should be screened for STIs, including gonorrhea and chlamydia if:  You are sexually active and are younger than 24 years.  You are older than 24 years, and your health care provider tells you that you are at risk for this type of infection.  Your sexual activity has changed since you were last screened, and you are at an increased risk for chlamydia or gonorrhea. Ask your health care provider if you are at risk.  If you are at risk of being infected with HIV, it is recommended that you take a prescription medicine daily to prevent HIV infection. This is called pre-exposure prophylaxis (PrEP). You are considered at risk if:  You are a man who has sex with other men (MSM).  You are a heterosexual man who is sexually active with multiple partners.  You take drugs by injection.  You are sexually active with a partner who has HIV.  Talk with your health care provider about whether you are at  high risk of being infected with HIV. If you choose to begin PrEP, you should first be tested for HIV. You should then be tested every 3 months for as long as you are taking PrEP.  Use sunscreen. Apply sunscreen liberally and repeatedly throughout the day. You should seek shade when your shadow is shorter than you. Protect yourself by wearing long sleeves, pants, a wide-brimmed hat, and sunglasses year round whenever you are outdoors.  Tell your health care provider of new moles or changes in moles, especially if there is a change in shape or color. Also, tell your health care provider if a mole is larger than the size of a pencil eraser.  A one-time screening for abdominal aortic aneurysm (AAA) and surgical repair of large AAAs by ultrasound is recommended for men aged 20-75 years who are current or former smokers.  Stay current with your vaccines (immunizations).   This information is not intended to replace advice given to you by your health care provider. Make sure you discuss any questions you have with your health care provider.   Document Released: 11/08/2007 Document Revised: 06/02/2014 Document Reviewed: 10/07/2010 Elsevier Interactive Patient Education [employer].

## 2015-06-29 NOTE — Assessment & Plan Note (Signed)
Discussed healthy diet and lifestyle changes to affect sustainable weight loss  

## 2015-06-29 NOTE — Assessment & Plan Note (Signed)
Advanced directives - son Towan Ramsdell) would be [facility] proxy.Has living will at home. Asked to bring me copy.

## 2015-06-29 NOTE — Progress Notes (Signed)
Pre visit review using our clinic review tool, if applicable. No additional management support is needed unless otherwise documented below in the visit note. 

## 2015-06-29 NOTE — Assessment & Plan Note (Signed)
Chronic, stable. Continue pravastatin. 

## 2015-06-29 NOTE — Assessment & Plan Note (Signed)
Encouraged weight loss to help control sugars.

## 2015-06-29 NOTE — Assessment & Plan Note (Signed)
Multiple on forearms and L pinna. Try hand cream to areas and return for LN2 therapy if no improvement noted.

## 2015-06-29 NOTE — Assessment & Plan Note (Signed)
Chronic, stable on PPI PRN.

## 2015-06-29 NOTE — Assessment & Plan Note (Addendum)
Preventative protocols reviewed and updated unless pt declined. Discussed healthy diet and lifestyle.  Only has medicare part A

## 2015-06-29 NOTE — Progress Notes (Signed)
BP 124/74 mmHg  Pulse 68  Temp(Src) 98 F (36.7 C) (Oral)  Wt 220 lb 8 oz (100.018 kg)   CC: CPE  Subjective:    Patient ID: [name], male    DOB: Oct 12, 1945, 70 y.o.   MRN: [id number]  HPI: [name] is a 70 y.o. male presenting on 06/29/2015 for Annual Exam   Would like spot on ear checked. Present for years. Hand cream helps.   No falls in last year.  No anhedonia, denies depression. Hobbies - joined shooting range, and enjoys this.  Possible memory trouble?  Preventative: Colonoscopy 2008 - normal, rpt 10 yrs Amedeo Plenty, [place])  Prostate - yearly. Always normal. Flu 02/2015 Pneumovax 05/2010, prevnar 2016 Td 07/2009 Shingles - requests today. Advanced directives - son Benhamin Damboise) would be [facility] proxy.Has living will at home. Asked to bring me copy.  Seat belt use discussed No sunscreen use. No changing moles on skin.   Caffeine: occaisonally, 2 diet cokes/day Married, lives with wife Retired: Development worker, community Activity: Walks and runs 3 miles a day. at [place]. Stays active golfing.  Diet: good water, fruits/vegetables daily, avoiding carbs and watching portion sizes  Relevant past medical, surgical, family and social history reviewed and updated as indicated. Interim medical history since our last visit reviewed. Allergies and medications reviewed and updated. Current Outpatient Prescriptions on File Prior to Visit  Medication Sig  . acetaminophen (TYLENOL) 500 MG tablet Take 500 mg by mouth every 6 (six) hours as needed.  Marland Kitchen albuterol (PROAIR HFA) 108 (90 BASE) MCG/ACT inhaler Inhale 2 puffs into the lungs every 6 (six) hours as needed for wheezing or shortness of breath.  . fluticasone (FLONASE) 50 MCG/ACT nasal spray USE 2 SPRAYS IN EACH NOSTRIL EVERY DAY AS DIRECTED  . Multiple Vitamin (MULTIVITAMIN) tablet Take 1 tablet by mouth daily.  . pantoprazole (PROTONIX) 40 MG tablet Take 1 tablet (40 mg total) by mouth daily.  . pravastatin  (PRAVACHOL) 40 MG tablet TAKE 1 TABLET BY MOUTH DAILY  . sildenafil (VIAGRA) 100 MG tablet Take 100 mg by mouth daily as needed for erectile dysfunction.   No current facility-administered medications on file prior to visit.    Review of Systems  Constitutional: Negative for fever, chills, activity change, appetite change, fatigue and unexpected weight change.  HENT: Negative for hearing loss.   Eyes: Negative for visual disturbance.  Respiratory: Positive for cough (mild). Negative for chest tightness, shortness of breath and wheezing.   Cardiovascular: Negative for chest pain, palpitations and leg swelling.  Gastrointestinal: Negative for nausea, vomiting, abdominal pain, diarrhea, constipation, blood in stool and abdominal distention.  Genitourinary: Negative for hematuria and difficulty urinating.  Musculoskeletal: Negative for myalgias, arthralgias and neck pain.  Skin: Negative for rash.  Neurological: Negative for dizziness, seizures, syncope and headaches.  Hematological: Negative for adenopathy. Does not bruise/bleed easily.  Psychiatric/Behavioral: Negative for dysphoric mood. The patient is not nervous/anxious.    Per HPI unless specifically indicated in ROS section     Objective:    BP 124/74 mmHg  Pulse 68  Temp(Src) 98 F (36.7 C) (Oral)  Wt 220 lb 8 oz (100.018 kg)  Wt Readings from Last 3 Encounters:  06/29/15 220 lb 8 oz (100.018 kg)  06/21/14 217 lb 8 oz (98.657 kg)  05/08/14 215 lb (97.523 kg)   Body mass index is 34.01 kg/(m^2).  Physical Exam  Constitutional: He is oriented to person, place, and time. He appears well-developed and well-nourished. No  distress.  HENT:  Head: Normocephalic and atraumatic.  Right Ear: Hearing, tympanic membrane, external ear and ear canal normal.  Left Ear: Hearing, tympanic membrane, external ear and ear canal normal.  Nose: Nose normal.  Mouth/Throat: Uvula is midline, oropharynx is clear and moist and mucous membranes are  normal. No oropharyngeal exudate, posterior oropharyngeal edema or posterior oropharyngeal erythema.  Eyes: Conjunctivae and EOM are normal. Pupils are equal, round, and reactive to light. No scleral icterus.  Neck: Normal range of motion. Neck supple. Carotid bruit is not present. No thyromegaly present.  Cardiovascular: Normal rate, regular rhythm, normal heart sounds and intact distal pulses.   No murmur heard. Pulses:      Radial pulses are 2+ on the right side, and 2+ on the left side.  Pulmonary/Chest: Effort normal and breath sounds normal. No respiratory distress. He has no wheezes. He has no rales.  Abdominal: Soft. Bowel sounds are normal. He exhibits no distension and no mass. There is no tenderness. There is no rebound and no guarding.  Genitourinary: Rectum normal and prostate normal. Rectal exam shows no external hemorrhoid, no internal hemorrhoid, no fissure, no mass, no tenderness and anal tone normal. Prostate is not enlarged (15gm) and not tender.  Musculoskeletal: Normal range of motion. He exhibits no edema.  Lymphadenopathy:    He has no cervical adenopathy.  Neurological: He is alert and oriented to person, place, and time.  CN grossly intact, station and gait intact  Skin: Skin is warm and dry. No rash noted.  Psychiatric: He has a normal mood and affect. His behavior is normal. Judgment and thought content normal.  Nursing note and vitals reviewed.  Results for orders placed or performed in visit on 06/26/15  Lipid panel  Result Value Ref Range   Cholesterol 161 0 - 200 mg/dL   Triglycerides 140.0 0.0 - 149.0 mg/dL   HDL 41.80 >39.00 mg/dL   VLDL 28.0 0.0 - 40.0 mg/dL   LDL Cholesterol 92 0 - 99 mg/dL   Total CHOL/HDL Ratio 4    NonHDL [id number]   Basic metabolic panel  Result Value Ref Range   Sodium 140 135 - 145 mEq/L   Potassium 4.1 3.5 - 5.1 mEq/L   Chloride 104 96 - 112 mEq/L   CO2 28 19 - 32 mEq/L   Glucose, Bld 103 (H) 70 - 99 mg/dL   BUN 16 6 - 23  mg/dL   Creatinine, Ser 1.11 0.40 - 1.50 mg/dL   Calcium 9.6 8.4 - 10.5 mg/dL   GFR 69.46 >60.00 mL/min  PSA, Medicare  Result Value Ref Range   PSA 0.61 0.10 - 4.00 ng/ml  CBC with Differential/Platelet  Result Value Ref Range   WBC 7.1 4.0 - 10.5 K/uL   RBC 4.87 4.22 - 5.81 Mil/uL   Hemoglobin 14.7 13.0 - 17.0 g/dL   HCT 43.9 39.0 - 52.0 %   MCV 90.2 78.0 - 100.0 fl   MCHC 33.5 30.0 - 36.0 g/dL   RDW 13.6 11.5 - 15.5 %   Platelets 285.0 150.0 - 400.0 K/uL   Neutrophils Relative % 54.9 43.0 - 77.0 %   Lymphocytes Relative 28.8 12.0 - 46.0 %   Monocytes Relative 9.1 3.0 - 12.0 %   Eosinophils Relative 6.8 (H) 0.0 - 5.0 %   Basophils Relative 0.4 0.0 - 3.0 %   Neutro Abs 3.9 1.4 - 7.7 K/uL   Lymphs Abs 2.0 0.7 - 4.0 K/uL   Monocytes Absolute 0.6 0.1 -  1.0 K/uL   Eosinophils Absolute 0.5 0.0 - 0.7 K/uL   Basophils Absolute 0.0 0.0 - 0.1 K/uL      Assessment & Plan:   Problem List Items Addressed This Visit    Obesity, Class I, BMI 30-34.9    Discussed healthy diet and lifestyle changes to affect sustainable weight loss      Impaired fasting blood sugar    Encouraged weight loss to help control sugars.      HYPERCHOLESTEROLEMIA    Chronic, stable. Continue pravastatin.      Health maintenance examination - Primary    Preventative protocols reviewed and updated unless pt declined. Discussed healthy diet and lifestyle.  Only has medicare part A      GERD (gastroesophageal reflux disease)    Chronic, stable on PPI PRN.       Advanced care planning/counseling discussion    Advanced directives - son Antoan Lagunes) would be [facility] proxy.Has living will at home. Asked to bring me copy.       Actinic keratosis    Multiple on forearms and L pinna. Try hand cream to areas and return for LN2 therapy if no improvement noted.          Follow up plan: Return in about 1 year (around 06/28/2016) for annual exam, prior fasting for blood work.

## 2015-11-27 ENCOUNTER — Other Ambulatory Visit: Payer: Self-pay | Admitting: Family Medicine

## 2015-11-29 ENCOUNTER — Other Ambulatory Visit: Payer: Self-pay | Admitting: Family Medicine

## 2015-11-30 ENCOUNTER — Ambulatory Visit (INDEPENDENT_AMBULATORY_CARE_PROVIDER_SITE_OTHER): Payer: 59 | Admitting: Family

## 2015-11-30 ENCOUNTER — Encounter: Payer: Self-pay | Admitting: Family

## 2015-11-30 VITALS — BP 140/88 | HR 66 | Wt 218.0 lb

## 2015-11-30 DIAGNOSIS — M10072 Idiopathic gout, left ankle and foot: Secondary | ICD-10-CM | POA: Diagnosis not present

## 2015-11-30 DIAGNOSIS — M109 Gout, unspecified: Secondary | ICD-10-CM

## 2015-11-30 DIAGNOSIS — M1A9XX0 Chronic gout, unspecified, without tophus (tophi): Secondary | ICD-10-CM | POA: Insufficient documentation

## 2015-11-30 MED ORDER — COLCHICINE 0.6 MG PO TABS: ORAL_TABLET | ORAL | Status: DC

## 2015-11-30 NOTE — Patient Instructions (Signed)
Please stop taking pravastatin, medication for cholesterol, while you are taking gout medication. There is a high drug drug interaction.  If there is no improvement in your symptoms, or if there is any worsening of symptoms, or if you have any additional concerns, please return for re-evaluation; or, if we are closed, consider going to the Emergency Room for evaluation if symptoms urgent.  Gout Gout is an inflammatory arthritis caused by a buildup of uric acid crystals in the joints. Uric acid is a chemical that is normally present in the blood. When the level of uric acid in the blood is too high it can form crystals that deposit in your joints and tissues. This causes joint redness, soreness, and swelling (inflammation). Repeat attacks are common. Over time, uric acid crystals can form into masses (tophi) near a joint, destroying bone and causing disfigurement. Gout is treatable and often preventable. CAUSES  The disease begins with elevated levels of uric acid in the blood. Uric acid is produced by your body when it breaks down a naturally found substance called purines. Certain foods you eat, such as meats and fish, contain high amounts of purines. Causes of an elevated uric acid level include:  Being passed down from parent to child (heredity).  Diseases that cause increased uric acid production (such as obesity, psoriasis, and certain cancers).  Excessive alcohol use.  Diet, especially diets rich in meat and seafood.  Medicines, including certain cancer-fighting medicines (chemotherapy), water pills (diuretics), and aspirin.  Chronic kidney disease. The kidneys are no longer able to remove uric acid well.  Problems with metabolism. Conditions strongly associated with gout include:  Obesity.  High blood pressure.  High cholesterol.  Diabetes. Not everyone with elevated uric acid levels gets gout. It is not understood why some people get gout and others do not. Surgery, joint  injury, and eating too much of certain foods are some of the factors that can lead to gout attacks. SYMPTOMS   An attack of gout comes on quickly. It causes intense pain with redness, swelling, and warmth in a joint.  Fever can occur.  Often, only one joint is involved. Certain joints are more commonly involved:  Base of the big toe.  Knee.  Ankle.  Wrist.  Finger. Without treatment, an attack usually goes away in a few days to weeks. Between attacks, you usually will not have symptoms, which is different from many other forms of arthritis. DIAGNOSIS  Your caregiver will suspect gout based on your symptoms and exam. In some cases, tests may be recommended. The tests may include:  Blood tests.  Urine tests.  X-rays.  Joint fluid exam. This exam requires a needle to remove fluid from the joint (arthrocentesis). Using a microscope, gout is confirmed when uric acid crystals are seen in the joint fluid. TREATMENT  There are two phases to gout treatment: treating the sudden onset (acute) attack and preventing attacks (prophylaxis).  Treatment of an Acute Attack.  Medicines are used. These include anti-inflammatory medicines or steroid medicines.  An injection of steroid medicine into the affected joint is sometimes necessary.  The painful joint is rested. Movement can worsen the arthritis.  You may use warm or cold treatments on painful joints, depending which works best for you.  Treatment to Prevent Attacks.  If you suffer from frequent gout attacks, your caregiver may advise preventive medicine. These medicines are started after the acute attack subsides. These medicines either help your kidneys eliminate uric acid from your body or  decrease your uric acid production. You may need to stay on these medicines for a very long time.  The early phase of treatment with preventive medicine can be associated with an increase in acute gout attacks. For this reason, during the first  few months of treatment, your caregiver may also advise you to take medicines usually used for acute gout treatment. Be sure you understand your caregiver's directions. Your caregiver may make several adjustments to your medicine dose before these medicines are effective.  Discuss dietary treatment with your caregiver or dietitian. Alcohol and drinks high in sugar and fructose and foods such as meat, poultry, and seafood can increase uric acid levels. Your caregiver or dietitian can advise you on drinks and foods that should be limited. HOME CARE INSTRUCTIONS   Do not take aspirin to relieve pain. This raises uric acid levels.  Only take over-the-counter or prescription medicines for pain, discomfort, or fever as directed by your caregiver.  Rest the joint as much as possible. When in bed, keep sheets and blankets off painful areas.  Keep the affected joint raised (elevated).  Apply warm or cold treatments to painful joints. Use of warm or cold treatments depends on which works best for you.  Use crutches if the painful joint is in your leg.  Drink enough fluids to keep your urine clear or pale yellow. This helps your body get rid of uric acid. Limit alcohol, sugary drinks, and fructose drinks.  Follow your dietary instructions. Pay careful attention to the amount of protein you eat. Your daily diet should emphasize fruits, vegetables, whole grains, and fat-free or low-fat milk products. Discuss the use of coffee, vitamin C, and cherries with your caregiver or dietitian. These may be helpful in lowering uric acid levels.  Maintain a healthy body weight. SEEK MEDICAL CARE IF:   You develop diarrhea, vomiting, or any side effects from medicines.  You do not feel better in 24 hours, or you are getting worse. SEEK IMMEDIATE MEDICAL CARE IF:   Your joint becomes suddenly more tender, and you have chills or a fever. MAKE SURE YOU:   Understand these instructions.  Will watch your  condition.  Will get help right away if you are not doing well or get worse.   This information is not intended to replace advice given to you by your health care provider. Make sure you discuss any questions you have with your health care provider.   Document Released: 05/09/2000 Document Revised: 06/02/2014 Document Reviewed: 12/24/2011 Elsevier Interactive Patient Education [employer].

## 2015-11-30 NOTE — Progress Notes (Signed)
Subjective:    Patient ID: [name], male    DOB: 17-Dec-1945, 70 y.o.   MRN: [id number]   [name] is a 70 y.o. male who presents today for an acute visit.    HPI Comments: Patient here for evaluation of left great toe pain for 2 weeks, waxing and waning. Now worsening. Redness, swelling of great toe. history of gout in both great toes. Has seen podiatry and urgent care in past for gout and had the 'crystals pulled out' and also a joint injection which worked well. Patient is not diabetic.No renal insufficiency. Relieved with aleve. No injury, fever, chills.   Past Medical History  Diagnosis Date  . History of nephrolithiasis     seen [facility]. [facility], last 02/2011  . Hypercholesterolemia   . ED (erectile dysfunction)   . AR (allergic rhinitis)   . Gout     per pt  . Complication of anesthesia     STATES SEVERE PAIN WAKING UP AFTER GENERAL ANESTHESIA; AND FEELS BAD.  Marland Kitchen GERD (gastroesophageal reflux disease)   . Arthritis     PAIN AND OA LEFT KNEE ;  S/P RIGHT TOTAL KNEE REPLACEMENT  . RBBB (right bundle branch block)    Allergies: Prednisone Current Outpatient Prescriptions on File Prior to Visit  Medication Sig Dispense Refill  . acetaminophen (TYLENOL) 500 MG tablet Take 500 mg by mouth every 6 (six) hours as needed.    Marland Kitchen albuterol (PROAIR HFA) 108 (90 BASE) MCG/ACT inhaler Inhale 2 puffs into the lungs every 6 (six) hours as needed for wheezing or shortness of breath. 1 Inhaler 3  . fluticasone (FLONASE) 50 MCG/ACT nasal spray USE 2 SPRAYS IN EACH NOSTRIL EVERY DAY AS DIRECTED 16 g 11  . Multiple Vitamin (MULTIVITAMIN) tablet Take 1 tablet by mouth daily.    . pantoprazole (PROTONIX) 40 MG tablet TAKE 1 TABLET BY MOUTH DAILY 30 tablet 6  . pravastatin (PRAVACHOL) 40 MG tablet TAKE 1 TABLET BY MOUTH DAILY 30 tablet 11  . sildenafil (VIAGRA) 100 MG tablet Take 100 mg by mouth daily as needed for erectile dysfunction.    Marland Kitchen VIAGRA 100 MG tablet TAKE 1 TABLET  BY MOUTH DAILY   AS NEEDEDFOR ERECTILE DYSFUNCTION 10 tablet 1   No current facility-administered medications on file prior to visit.    Social History  Substance Use Topics  . Smoking status: Never Smoker   . Smokeless tobacco: Never Used  . Alcohol Use: No    Review of Systems  Constitutional: Negative for fever and chills.  Respiratory: Negative for cough.   Cardiovascular: Negative for chest pain and palpitations.  Gastrointestinal: Negative for nausea and vomiting.  Musculoskeletal: Positive for joint swelling.      Objective:    BP 140/88 mmHg  Pulse 66  Wt 218 lb (98.884 kg)  SpO2 97%   Physical Exam  Constitutional: He appears well-developed and well-nourished.  Cardiovascular: Regular rhythm and normal heart sounds.   Pulmonary/Chest: Effort normal and breath sounds normal. No respiratory distress. He has no wheezes. He has no rhonchi. He has no rales.  Musculoskeletal:       Left foot: There is bony tenderness. There is normal range of motion, no tenderness, no swelling, no deformity and no laceration.  Slight erythema and tenderness over left MTP. No increase in warmth. No swelling.  Lymphadenopathy:       Head (left side): No submandibular and no preauricular adenopathy present.  Neurological: He is alert.  Skin: Skin is warm and dry.  Psychiatric: He has a normal mood and affect. His speech is normal and behavior is normal.  Vitals reviewed.      Assessment & Plan:   1. Acute gout of left foot, unspecified cause No systemic features. H/o gout. Classic podagra. Patient prefers to not take NSAIDs for flare. Allergic to prednisone. Advised to stop pravastatin while on colchichine.   - colchicine 0.6 MG tablet; Take 1.2 mg PO, one hour later by another 0.6 mg on first day; then 0.6 mg twice daily until attack resolved.1  Dispense: 15 tablet; Refill: 0  Return precautions given.  I am having Mr. Faro maintain his acetaminophen, albuterol, sildenafil,  multivitamin, fluticasone, pravastatin, VIAGRA, and pantoprazole.   No orders of the defined types were placed in this encounter.     Start medications as prescribed and explained to patient on After Visit Summary ( AVS). Risks, benefits, and alternatives of the medications and treatment plan prescribed today were discussed, and patient expressed understanding.   Education regarding symptom management and diagnosis given to patient.   Follow-up:Plan follow-up and return precautions given if any worsening symptoms or change in condition.   Continue to follow with Ria Bush, MD for routine health maintenance.   [name] and I agreed with plan.   Mable Paris, FNP

## 2016-01-02 ENCOUNTER — Encounter: Payer: Self-pay | Admitting: Internal Medicine

## 2016-01-02 ENCOUNTER — Ambulatory Visit (INDEPENDENT_AMBULATORY_CARE_PROVIDER_SITE_OTHER): Payer: 59 | Admitting: Internal Medicine

## 2016-01-02 ENCOUNTER — Telehealth: Payer: Self-pay | Admitting: Family Medicine

## 2016-01-02 ENCOUNTER — Emergency Department (HOSPITAL_COMMUNITY): Payer: 59

## 2016-01-02 ENCOUNTER — Encounter (HOSPITAL_COMMUNITY): Payer: Self-pay | Admitting: *Deleted

## 2016-01-02 ENCOUNTER — Emergency Department (HOSPITAL_COMMUNITY)
Admission: EM | Admit: 2016-01-02 | Discharge: 2016-01-02 | Disposition: A | Discharge status: Home / Self Care | Payer: 59 | Attending: Emergency Medicine | Admitting: Emergency Medicine

## 2016-01-02 VITALS — BP 134/68 | HR 79 | Temp 99.3°F | Wt 210.0 lb

## 2016-01-02 DIAGNOSIS — R059 Cough, unspecified: Secondary | ICD-10-CM

## 2016-01-02 DIAGNOSIS — J209 Acute bronchitis, unspecified: Secondary | ICD-10-CM | POA: Diagnosis not present

## 2016-01-02 DIAGNOSIS — R05 Cough: Secondary | ICD-10-CM

## 2016-01-02 DIAGNOSIS — R509 Fever, unspecified: Secondary | ICD-10-CM | POA: Diagnosis not present

## 2016-01-02 DIAGNOSIS — Z96653 Presence of artificial knee joint, bilateral: Secondary | ICD-10-CM | POA: Insufficient documentation

## 2016-01-02 DIAGNOSIS — R0602 Shortness of breath: Secondary | ICD-10-CM | POA: Diagnosis not present

## 2016-01-02 MED ORDER — AZITHROMYCIN 250 MG PO TABS: 250.0000 mg | ORAL_TABLET | Freq: Every day | ORAL | 0 refills | Status: DC

## 2016-01-02 MED ORDER — BENZONATATE 100 MG PO CAPS: 100.0000 mg | ORAL_CAPSULE | Freq: Three times a day (TID) | ORAL | 0 refills | Status: DC | PRN

## 2016-01-02 MED ORDER — AEROCHAMBER PLUS FLO-VU MEDIUM MISC
1.0000 | Freq: Once | Status: AC
Start: 2016-01-02 — End: 2016-01-02
  Administered 2016-01-02: 1
  Filled 2016-01-02: qty 1

## 2016-01-02 MED ORDER — ALBUTEROL SULFATE HFA 108 (90 BASE) MCG/ACT IN AERS
2.0000 | INHALATION_SPRAY | Freq: Once | RESPIRATORY_TRACT | Status: AC
  Administered 2016-01-02: 2 via RESPIRATORY_TRACT
  Filled 2016-01-02: qty 6.7

## 2016-01-02 NOTE — ED Triage Notes (Signed)
The pt came from a doctors office in [place]  He has had a 2 day history of a cold cough with other cold related symptoms.  Sore throat from coughing  Unknown temp hoarse .  Just not feeling well

## 2016-01-02 NOTE — ED Notes (Signed)
Pt to xray

## 2016-01-02 NOTE — Telephone Encounter (Signed)
[facility] Patient Name: RIDA STEENO DOB: 09-Aug-1945 Initial Comment Caller states husband has a lot of chest congestion, coughing, trouble catching his breath Nurse Assessment Nurse: Dimas Chyle, RN, Dellis Filbert Date/Time (Eastern Time): 01/02/2016 8:07:43 AM Confirm and document reason for call. If symptomatic, describe symptoms. You must click the next button to save text entered. ---Caller states husband has a lot of chest congestion, coughing, trouble catching his breath. Symptoms started yesterday. Has the patient traveled out of the country within the last 30 days? ---No Does the patient have any new or worsening symptoms? ---Yes Will a triage be completed? ---Yes Related visit to physician within the last 2 weeks? ---No Does the PT have any chronic conditions? (i.e. diabetes, asthma, etc.) ---No Is this a behavioral health or substance abuse call? ---No Guidelines Guideline Title Affirmed Question Affirmed Notes Asthma Attack [1] MILD asthma attack (e.g., no SOB at rest, mild SOB with walking, speaks normally in sentences, mild wheezing) AND [2] persists > 24 hours on appropriate treatment Final Disposition User See Physician within 24 Hours Dimas Chyle, RN, [employer] Referrals REFERRED TO PCP OFFICE Disagree/Comply: Leta Baptist

## 2016-01-02 NOTE — ED Provider Notes (Signed)
[place] DEPT Provider Note   CSN: [id number] Arrival date & time: 01/02/16  1504  First Provider Contact:  First MD Initiated Contact with Patient 01/02/16 1557        History   Chief Complaint Chief Complaint  Patient presents with  . Cough    HPI [name] is a 70 y.o. male.  The history is provided by the patient.  Cough  This is a new problem. Episode onset: 3 days ago. The problem occurs constantly. The problem has been gradually worsening. The cough is productive of sputum. There has been no fever. Associated symptoms include chills and shortness of breath (with ambulation). He has tried nothing for the symptoms. He is not a smoker. His past medical history is significant for bronchitis.    Past Medical History:  Diagnosis Date  . AR (allergic rhinitis)   . Arthritis    PAIN AND OA LEFT KNEE ;  S/P RIGHT TOTAL KNEE REPLACEMENT  . Complication of anesthesia    STATES SEVERE PAIN WAKING UP AFTER GENERAL ANESTHESIA; AND FEELS BAD.  . ED (erectile dysfunction)   . GERD (gastroesophageal reflux disease)   . Gout    per pt  . History of nephrolithiasis    seen Dr. [facility], last 02/2011  . Hypercholesterolemia   . RBBB (right bundle branch block)     Patient Active Problem List   Diagnosis Date Noted  . Gout 11/30/2015  . Actinic keratosis 06/29/2015  . Obesity, Class I, BMI 30-34.9 06/29/2015  . Advanced care planning/counseling discussion 06/21/2014  . OA (osteoarthritis) of knee 05/08/2014  . GERD (gastroesophageal reflux disease) 05/04/2014  . RBBB 01/18/2014  . 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 Health maintenance examination 06/16/2011  . Impaired fasting blood sugar 06/16/2011  . Kidney stone 03/24/2011  . Constipation 03/24/2011  . HYPERCHOLESTEROLEMIA 04/16/2009  . ALLERGIC RHINITIS 04/16/2009    Past Surgical History:  Procedure Laterality Date  . 2D echo  99991111   mild diastolic dysfunction, nl EF   . COLONOSCOPY   05/2006   normal, rpt 10 yrs Amedeo Plenty, [place])  . KNEE ARTHROSCOPY Right 2000  . L thumb joint replace Left 2005   GRAMIG   . [place]  . TOTAL KNEE ARTHROPLASTY Right 12/2010  . TOTAL KNEE ARTHROPLASTY Left 05/08/2014   Procedure: LEFT TOTAL KNEE ARTHROPLASTY;  Surgeon: Gearlean Alf, MD;  Location: [facility];  Service: Orthopedics;  Laterality: Left;       Home Medications    Prior to Admission medications   Medication Sig Start Date End Date Taking? Authorizing Provider  albuterol (PROAIR HFA) 108 (90 BASE) MCG/ACT inhaler Inhale 2 puffs into the lungs every 6 (six) hours as needed for wheezing or shortness of breath. 07/31/13  Yes Ria Bush, MD  colchicine 0.6 MG tablet Take 1.2 mg PO, one hour later by another 0.6 mg on first day; then 0.6 mg twice daily until attack resolved.1 11/30/15  Yes Burnard Hawthorne, FNP  fluticasone (FLONASE) 50 MCG/ACT nasal spray USE 2 SPRAYS IN EACH NOSTRIL EVERY DAY AS DIRECTED 11/08/14  Yes Ria Bush, MD  Multiple Vitamin (MULTIVITAMIN) tablet Take 1 tablet by mouth daily.   Yes Historical Provider, MD  pantoprazole (PROTONIX) 40 MG tablet TAKE 1 TABLET BY MOUTH DAILY 11/29/15  Yes Ria Bush, MD  pantoprazole (PROTONIX) 40 MG tablet Take 40 mg by mouth daily as needed (acod reflux).   Yes Historical Provider, MD  pravastatin (PRAVACHOL) 40  MG tablet TAKE 1 TABLET BY MOUTH DAILY 05/14/15  Yes Ria Bush, MD  acetaminophen (TYLENOL) 500 MG tablet Take 500 mg by mouth every 6 (six) hours as needed.    Historical Provider, MD  sildenafil (VIAGRA) 100 MG tablet Take 100 mg by mouth daily as needed for erectile dysfunction.    Historical Provider, MD  VIAGRA 100 MG tablet TAKE 1 TABLET BY MOUTH DAILY   AS NEEDEDFOR ERECTILE DYSFUNCTION 11/28/15   Ria Bush, MD    Family History Family History  Problem Relation Age of Onset  . Cancer Father     lung and brain (smoker)  . Stroke Paternal Grandmother 17     stroke or heart attack (unsure)  . Cancer Mother     lung (smoker)  . Cancer Maternal Grandmother     leukemia  . CAD Neg Hx     unsure (see above)  . Diabetes Neg Hx     Social History Social History  Substance Use Topics  . Smoking status: Never Smoker  . Smokeless tobacco: Never Used  . Alcohol use No     Allergies   Prednisone   Review of Systems Review of Systems  Constitutional: Positive for chills.  Respiratory: Positive for cough and shortness of breath (with ambulation).   All other systems reviewed and are negative.    Physical Exam Updated Vital Signs BP 129/64   Pulse 77   Temp 99.4 F (37.4 C)   Resp 21   Ht 5\' 9"  (1.753 m)   Wt 211 lb (95.7 kg)   SpO2 97%   BMI 31.16 kg/m   Physical Exam  Constitutional: He is oriented to person, place, and time. He appears well-developed and well-nourished. No distress.  HENT:  Head: Normocephalic and atraumatic.  Eyes: Conjunctivae are normal.  Neck: Neck supple. No tracheal deviation present.  Cardiovascular: Normal rate, regular rhythm and normal heart sounds.   Pulmonary/Chest: Effort normal. No respiratory distress. He has wheezes (fine expiratory).  harsh cough  Abdominal: Soft. He exhibits no distension.  Neurological: He is alert and oriented to person, place, and time.  Skin: Skin is warm and dry.  Psychiatric: He has a normal mood and affect.  Vitals reviewed.    ED Treatments / Results  Labs (all labs ordered are listed, but only abnormal results are displayed) Labs Reviewed - No data to display  EKG  EKG Interpretation  Date/Time:  Wednesday January 02 2016 15:05:29 EDT Ventricular Rate:  69 PR Interval:    QRS Duration: 143 QT Interval:  400 QTC Calculation: 429 R Axis:   93 Text Interpretation:  Sinus rhythm RBBB and LPFB New since previous tracing Otherwise no significant change Confirmed by Daria Mcmeekin MD, Jisselle Poth [phone]) on 01/02/2016 3:10:37 PM       Radiology Dg Chest 2  View  Result Date: 01/02/2016 CLINICAL DATA:  Cough. EXAM: CHEST  2 VIEW COMPARISON:  04/26/2014 FINDINGS: The heart size and mediastinal contours are within normal limits. There is no evidence of pulmonary edema, consolidation, pneumothorax, nodule or pleural fluid. The visualized skeletal structures are unremarkable. IMPRESSION: No active cardiopulmonary disease. Electronically Signed   By: Aletta Edouard M.D.   On: 01/02/2016 15:46    Procedures Procedures (including critical care time)  Medications Ordered in ED Medications  albuterol (PROVENTIL HFA;VENTOLIN HFA) 108 (90 Base) MCG/ACT inhaler 2 puff (not administered)  AEROCHAMBER PLUS FLO-VU MEDIUM MISC 1 each (not administered)     Initial Impression / Assessment and Plan /  ED Course  I have reviewed the triage vital signs and the nursing notes.  Pertinent labs & imaging results that were available during my care of the patient were reviewed by me and considered in my medical decision making (see chart for details).  Clinical Course    70 y.o. male presents with cough for 3 days that has been worsening and productive of yellow sputum. No PNA on CXR. Pt not hypoxemic during ED course. Clinically suspect bronchitis of unknown etiology presumed infectious. Azithromycin to cover atypicals and albuterol inhaler/tessalon for home to help with cough and shortness of breath symptoms. Plan to follow up with PCP as needed and return precautions discussed for worsening or new concerning symptoms.   Final Clinical Impressions(s) / ED Diagnoses   Final diagnoses:  Acute bronchitis, unspecified organism    New Prescriptions Discharge Medication List as of 01/02/2016  4:04 PM    START taking these medications   Details  azithromycin (ZITHROMAX) 250 MG tablet Take 1 tablet (250 mg total) by mouth daily. Take first 2 tablets together, then 1 every day until finished., Starting Wed 01/02/2016, Print    benzonatate (TESSALON) 100 MG capsule Take  1 capsule (100 mg total) by mouth 3 (three) times daily as needed for cough., Starting Wed 01/02/2016, Print         Leo Grosser, MD 01/02/16 2125

## 2016-01-02 NOTE — Progress Notes (Signed)
Subjective:    Patient ID: [name], male    DOB: September 13, 1945, 70 y.o.   MRN: [id number]  HPI  Pt presents to the clinic today with c/o cough and shortness of breath. He reports this started abruptly last night. The cough is productive of clear.yellow mucous. He denies chest tightness or chest pain. He has had chills, body aches and feels like he is going to pass out. He does have a slight runny nose, but denies nasal congestion, ear pain, sore throat, nausea, vomiting or diarrhea. He has taken OTC antihistamine, Flonase and Nyquil with minimal relief. He has no history of breathing problems. He does not smoke. He did volunteer recently at a homeless shelter, and may have had sick exposure there but he is not sure.   Review of Systems      Past Medical History:  Diagnosis Date  . AR (allergic rhinitis)   . Arthritis    PAIN AND OA LEFT KNEE ;  S/P RIGHT TOTAL KNEE REPLACEMENT  . Complication of anesthesia    STATES SEVERE PAIN WAKING UP AFTER GENERAL ANESTHESIA; AND FEELS BAD.  . ED (erectile dysfunction)   . GERD (gastroesophageal reflux disease)   . Gout    per pt  . History of nephrolithiasis    seen Dr. [facility], last 02/2011  . Hypercholesterolemia   . RBBB (right bundle branch block)     Current Outpatient Prescriptions  Medication Sig Dispense Refill  . acetaminophen (TYLENOL) 500 MG tablet Take 500 mg by mouth every 6 (six) hours as needed.    Marland Kitchen albuterol (PROAIR HFA) 108 (90 BASE) MCG/ACT inhaler Inhale 2 puffs into the lungs every 6 (six) hours as needed for wheezing or shortness of breath. 1 Inhaler 3  . colchicine 0.6 MG tablet Take 1.2 mg PO, one hour later by another 0.6 mg on first day; then 0.6 mg twice daily until attack resolved.1 15 tablet 0  . fluticasone (FLONASE) 50 MCG/ACT nasal spray USE 2 SPRAYS IN EACH NOSTRIL EVERY DAY AS DIRECTED 16 g 11  . Multiple Vitamin (MULTIVITAMIN) tablet Take 1 tablet by mouth daily.    . pantoprazole  (PROTONIX) 40 MG tablet TAKE 1 TABLET BY MOUTH DAILY 30 tablet 6  . pravastatin (PRAVACHOL) 40 MG tablet TAKE 1 TABLET BY MOUTH DAILY 30 tablet 11  . sildenafil (VIAGRA) 100 MG tablet Take 100 mg by mouth daily as needed for erectile dysfunction.    Marland Kitchen VIAGRA 100 MG tablet TAKE 1 TABLET BY MOUTH DAILY   AS NEEDEDFOR ERECTILE DYSFUNCTION 10 tablet 1   No current facility-administered medications for this visit.     Allergies  Allergen Reactions  . Prednisone Rash    Family History  Problem Relation Age of Onset  . Cancer Father     lung and brain (smoker)  . Stroke Paternal Grandmother 78    stroke or heart attack (unsure)  . Cancer Mother     lung (smoker)  . Cancer Maternal Grandmother     leukemia  . CAD Neg Hx     unsure (see above)  . Diabetes Neg Hx     Social History   Social History  . Marital status: Married    Spouse name: N/A  . Number of children: N/A  . Years of education: N/A   Occupational History  . Not on file.   Social History Main Topics  . Smoking status: Never Smoker  . Smokeless tobacco: Never Used  .  Alcohol use No  . Drug use: No  . Sexual activity: Not on file   Other Topics Concern  . Not on file   Social History Narrative   Caffeine: occaisonally, 2 diet cokes/day   Married, lives with wife   Retired: Development worker, community   Activity: Walks and runs 3 miles a day. at [place]. Stays active golfing.    Diet: good water, fruits/vegetables daily, avoiding carbs and watching portion sizes     Constitutional: Pt reports fever, weakness. Denies malaise, fatigue, headache or abrupt weight changes.  HEENT: Pt reports runny nose. Denies eye pain, eye redness, ear pain, ringing in the ears, wax buildup, nasal congestion, bloody nose, or sore throat. Respiratory: Pt reports cough and shortness of breath. Denies difficulty breathing, or sputum production.   Cardiovascular: Denies chest pain, chest tightness, palpitations or swelling in the hands or feet.   Gastrointestinal: Denies abdominal pain, bloating, constipation, diarrhea or blood in the stool.  Neurological: Denies dizziness, difficulty with memory, difficulty with speech or problems with balance and coordination.    No other specific complaints in a complete review of systems (except as listed in HPI above).  Objective:   Physical Exam   BP 134/68   Pulse 79   Temp 99.3 F (37.4 C) (Oral)   Wt 210 lb (95.3 kg)   SpO2 91%   BMI 32.41 kg/m  Wt Readings from Last 3 Encounters:  01/02/16 210 lb (95.3 kg)  11/30/15 218 lb (98.9 kg)  06/29/15 220 lb 8 oz (100 kg)    General: Appears his stated age, obese male. Skin: Pale and diaphoretic. HEENT: Head: normal shape and siz, no sinus tenderness noted; Eyes: sclera white, no icterus, conjunctiva pinkThroat/Mouth: Teeth present, mucosa pink and moist, no exudate, lesions or ulcerations noted.  Neck:  No adenopathy noted. Cardiovascular: Normal rate and rhythm. Diminished S1,S2 noted.  No murmur, rubs or gallops noted.  Pulmonary/Chest: Increased effort and dimished vesicular breath sounds.  Neurological: Alert and oriented.    BMET    Component Value Date/Time   NA 140 06/26/2015 1157   K 4.1 06/26/2015 1157   CL 104 06/26/2015 1157   CO2 28 06/26/2015 1157   GLUCOSE 103 (H) 06/26/2015 1157   BUN 16 06/26/2015 1157   CREATININE 1.11 06/26/2015 1157   CALCIUM 9.6 06/26/2015 1157   GFRNONAA 83 (L) 05/10/2014 0430   GFRAA >90 05/10/2014 0430    Lipid Panel     Component Value Date/Time   CHOL 161 06/26/2015 1157   TRIG 140.0 06/26/2015 1157   HDL 41.80 06/26/2015 1157   CHOLHDL 4 06/26/2015 1157   VLDL 28.0 06/26/2015 1157   LDLCALC 92 06/26/2015 1157    CBC    Component Value Date/Time   WBC 7.1 06/26/2015 1157   RBC 4.87 06/26/2015 1157   HGB 14.7 06/26/2015 1157   HCT 43.9 06/26/2015 1157   PLT 285.0 06/26/2015 1157   MCV 90.2 06/26/2015 1157   MCH 30.5 05/10/2014 0430   MCHC 33.5 06/26/2015 1157    RDW 13.6 06/26/2015 1157   LYMPHSABS 2.0 06/26/2015 1157   MONOABS 0.6 06/26/2015 1157   EOSABS 0.5 06/26/2015 1157   BASOSABS 0.0 06/26/2015 1157    Hgb A1C Lab Results  Component Value Date   HGBA1C 5.5 06/14/2014           Assessment & Plan:   Fever, cough and shortness of breath:  Sats 86% 2L O2 applied, sats up to 91-92% EMS called-  wife agrees best course of action is ER for evaluation given pt need for O2 in office Will notify PCP as FYI  Follow up with PCP after ER visit Webb Silversmith, NP

## 2016-01-02 NOTE — Progress Notes (Signed)
Pre visit review using our clinic review tool, if applicable. No additional management support is needed unless otherwise documented below in the visit note. 

## 2016-01-02 NOTE — ED Notes (Signed)
Pt comfortable with discharge and follow up instructions. Pt declines wheelchair, escorted to waiting area by this RN. Rxx2

## 2016-01-02 NOTE — Patient Instructions (Signed)

## 2016-01-02 NOTE — ED Notes (Signed)
The pt is alert no pain  Not coughing at present  He had a hhn on the way here

## 2016-01-03 NOTE — Telephone Encounter (Signed)
Seen at ER

## 2016-01-17 ENCOUNTER — Ambulatory Visit (INDEPENDENT_AMBULATORY_CARE_PROVIDER_SITE_OTHER): Payer: 59 | Admitting: Family Medicine

## 2016-01-17 ENCOUNTER — Encounter: Payer: Self-pay | Admitting: Family Medicine

## 2016-01-17 VITALS — BP 120/74 | HR 66 | Temp 98.0°F | Wt 212.8 lb

## 2016-01-17 DIAGNOSIS — R05 Cough: Secondary | ICD-10-CM

## 2016-01-17 DIAGNOSIS — R059 Cough, unspecified: Secondary | ICD-10-CM

## 2016-01-17 MED ORDER — GUAIFENESIN-CODEINE 100-10 MG/5ML PO SYRP: 5.0000 mL | ORAL_SOLUTION | Freq: Two times a day (BID) | ORAL | 0 refills | Status: DC | PRN

## 2016-01-17 MED ORDER — LEVOFLOXACIN 500 MG PO TABS: 500.0000 mg | ORAL_TABLET | Freq: Every day | ORAL | 0 refills | Status: DC

## 2016-01-17 MED ORDER — DEXAMETHASONE SODIUM PHOSPHATE 10 MG/ML IJ SOLN
10.0000 mg | Freq: Once | INTRAMUSCULAR | Status: AC
  Administered 2016-01-17: 10 mg via INTRAMUSCULAR

## 2016-01-17 NOTE — Patient Instructions (Signed)
Dexamethasone 10mg  shot today. Schedule albuterol with spacer three times daily.  Cheratussin for cough at night time. levaquin antibiotic to hold on to - fill if fever >101 or worsening productive cough. Return if no better or worsening despite above.

## 2016-01-17 NOTE — Assessment & Plan Note (Addendum)
Recently completed zpack for presumed bronchitis. At that time symptoms fully resolved. Now over last 1-2 days recurring cough not associated with fever or dyspnea - in setting of increased dust exposure while reflooring home. Anticipate reactive airways to dust exposure. Discussed. Treat with dexamethasone 10mg  IM today, cheratussin for night time cough, schedule albuterol TID x 3d with spacer then PRN.  WASP for levaquin printed today - with indications on when to fill. Pt and wife agree with plan.

## 2016-01-17 NOTE — Progress Notes (Signed)
BP 120/74   Pulse 66   Temp 98 F (36.7 C) (Oral)   Wt 212 lb 12 oz (96.5 kg)   SpO2 95%   BMI 31.42 kg/m    CC: f/u ER, cough Subjective:    Patient ID: [name], male    DOB: 02-01-1946, 70 y.o.   MRN: [id number]  HPI: [name] is a 70 y.o. male presenting on 01/17/2016 for Bronchitis (went to ER 2 weeks ago-got better and now getting sick again)   See recent notes. Seen in office by Rollene Fare 01/02/2016 - at that time hypoxic - referred to ER. CXR was normal. Dx with bronchitis, treated with albuterol treatment and azithromycin. He did feel better until 1-2d ago when cough recurred. Some dyspnea with cough. Mild sinus congestion and headache from cough.   No fever, wheezing, ear or tooth pain, ST or PNDrainage, chest pain, nausea.   Tessalon perls ineffective.  Albuterol inhaler has helped for a few minutes.   No h/o asthma, COPD, non smoker. No sick contacts at home.  Currently getting new flooring in house.   Relevant past medical, surgical, family and social history reviewed and updated as indicated. Interim medical history since our last visit reviewed. Allergies and medications reviewed and updated. Current Outpatient Prescriptions on File Prior to Visit  Medication Sig  . acetaminophen (TYLENOL) 500 MG tablet Take 500 mg by mouth every 6 (six) hours as needed.  Marland Kitchen albuterol (PROAIR HFA) 108 (90 BASE) MCG/ACT inhaler Inhale 2 puffs into the lungs every 6 (six) hours as needed for wheezing or shortness of breath.  . benzonatate (TESSALON) 100 MG capsule Take 1 capsule (100 mg total) by mouth 3 (three) times daily as needed for cough.  . colchicine 0.6 MG tablet Take 1.2 mg PO, one hour later by another 0.6 mg on first day; then 0.6 mg twice daily until attack resolved.1  . fluticasone (FLONASE) 50 MCG/ACT nasal spray USE 2 SPRAYS IN EACH NOSTRIL EVERY DAY AS DIRECTED  . Multiple Vitamin (MULTIVITAMIN) tablet Take 1 tablet by mouth daily.  . pantoprazole  (PROTONIX) 40 MG tablet TAKE 1 TABLET BY MOUTH DAILY  . pantoprazole (PROTONIX) 40 MG tablet Take 40 mg by mouth daily as needed (acod reflux).  . pravastatin (PRAVACHOL) 40 MG tablet TAKE 1 TABLET BY MOUTH DAILY  . sildenafil (VIAGRA) 100 MG tablet Take 100 mg by mouth daily as needed for erectile dysfunction.  Marland Kitchen VIAGRA 100 MG tablet TAKE 1 TABLET BY MOUTH DAILY   AS NEEDEDFOR ERECTILE DYSFUNCTION   No current facility-administered medications on file prior to visit.     Review of Systems Per HPI unless specifically indicated in ROS section     Objective:    BP 120/74   Pulse 66   Temp 98 F (36.7 C) (Oral)   Wt 212 lb 12 oz (96.5 kg)   SpO2 95%   BMI 31.42 kg/m   Wt Readings from Last 3 Encounters:  01/17/16 212 lb 12 oz (96.5 kg)  01/02/16 211 lb (95.7 kg)  01/02/16 210 lb (95.3 kg)    Physical Exam  Constitutional: He appears well-developed and well-nourished. No distress.  HENT:  Head: Normocephalic and atraumatic.  Right Ear: Hearing, tympanic membrane, external ear and ear canal normal.  Left Ear: Hearing, tympanic membrane, external ear and ear canal normal.  Nose: Nose normal. No mucosal edema or rhinorrhea. Right sinus exhibits no maxillary sinus tenderness and no frontal sinus tenderness. Left sinus exhibits  no maxillary sinus tenderness and no frontal sinus tenderness.  Mouth/Throat: Uvula is midline, oropharynx is clear and moist and mucous membranes are normal. No oropharyngeal exudate, posterior oropharyngeal edema, posterior oropharyngeal erythema or tonsillar abscesses.  Eyes: Conjunctivae and EOM are normal. Pupils are equal, round, and reactive to light. No scleral icterus.  Neck: Normal range of motion. Neck supple.  Cardiovascular: Normal rate, regular rhythm, normal heart sounds and intact distal pulses.   No murmur heard. Pulmonary/Chest: Effort normal and breath sounds normal. No respiratory distress. He has no wheezes. He has no rales.  Lungs overall  clear  Lymphadenopathy:    He has no cervical adenopathy.  Skin: Skin is warm and dry. No rash noted.  Nursing note and vitals reviewed.  Results for orders placed or performed in visit on 06/26/15  Hepatitis C antibody  Result Value Ref Range   HCV Ab NEGATIVE NEGATIVE      Assessment & Plan:   Problem List Items Addressed This Visit    Cough - Primary    Recently completed zpack for presumed bronchitis. At that time symptoms fully resolved. Now over last 1-2 days recurring cough not associated with fever or dyspnea - in setting of increased dust exposure while reflooring home. Anticipate reactive airways to dust exposure. Discussed. Treat with dexamethasone 10mg  IM today, cheratussin for night time cough, schedule albuterol TID x 3d with spacer then PRN.  WASP for levaquin printed today - with indications on when to fill. Pt and wife agree with plan.       Other Visit Diagnoses   None.      Follow up plan: Return if symptoms worsen or fail to improve.  Ria Bush, MD

## 2016-01-17 NOTE — Progress Notes (Signed)
Pre visit review using our clinic review tool, if applicable. No additional management support is needed unless otherwise documented below in the visit note. 

## 2016-01-17 NOTE — Addendum Note (Signed)
Addended by: Royann Shivers A on: 01/17/2016 01:21 PM   Modules accepted: Orders

## 2016-01-25 ENCOUNTER — Other Ambulatory Visit: Payer: Self-pay | Admitting: Family Medicine

## 2016-01-26 ENCOUNTER — Other Ambulatory Visit: Payer: Self-pay | Admitting: Family Medicine

## 2016-03-11 ENCOUNTER — Ambulatory Visit (INDEPENDENT_AMBULATORY_CARE_PROVIDER_SITE_OTHER): Payer: 59

## 2016-03-11 DIAGNOSIS — Z23 Encounter for immunization: Secondary | ICD-10-CM

## 2016-04-23 ENCOUNTER — Ambulatory Visit (INDEPENDENT_AMBULATORY_CARE_PROVIDER_SITE_OTHER)
Admission: RE | Admit: 2016-04-23 | Discharge: 2016-04-23 | Disposition: A | Discharge status: Home / Self Care | Payer: 59 | Source: Ambulatory Visit | Attending: Family Medicine | Admitting: Family Medicine

## 2016-04-23 ENCOUNTER — Ambulatory Visit (INDEPENDENT_AMBULATORY_CARE_PROVIDER_SITE_OTHER): Payer: 59 | Admitting: Family Medicine

## 2016-04-23 ENCOUNTER — Encounter: Payer: Self-pay | Admitting: Family Medicine

## 2016-04-23 VITALS — BP 140/80 | HR 76 | Temp 97.9°F | Wt 217.8 lb

## 2016-04-23 DIAGNOSIS — M169 Osteoarthritis of hip, unspecified: Secondary | ICD-10-CM | POA: Insufficient documentation

## 2016-04-23 DIAGNOSIS — M1611 Unilateral primary osteoarthritis, right hip: Secondary | ICD-10-CM | POA: Diagnosis not present

## 2016-04-23 MED ORDER — TRAMADOL HCL 50 MG PO TABS: 50.0000 mg | ORAL_TABLET | Freq: Two times a day (BID) | ORAL | 0 refills | Status: DC | PRN

## 2016-04-23 MED ORDER — NAPROXEN 500 MG PO TABS: ORAL_TABLET | ORAL | 0 refills | Status: DC

## 2016-04-23 NOTE — Assessment & Plan Note (Signed)
Exam most consistent with R hip OA. Baseline xray today. Treat with naprosyn BID and tramadol PRN breakthrough pain. Discussed limiting impact exercise. Will refer to ortho (pt sees Dr Reynaldo Minium).

## 2016-04-23 NOTE — Progress Notes (Signed)
Pre visit review using our clinic review tool, if applicable. No additional management support is needed unless otherwise documented below in the visit note. 

## 2016-04-23 NOTE — Patient Instructions (Signed)
Let us know new phone number you prefer I do think hip pain is osteoarthritis of hip.  Xray today.  Treat with naprosyn twice daily with meals for 1 week then as needed, may use tramadol as needed for breakthrough pain. Preserve cartilage - less weight bearing exercise.

## 2016-04-23 NOTE — Progress Notes (Signed)
BP 140/80   Pulse 76   Temp 97.9 F (36.6 C) (Oral)   Wt 217 lb 12 oz (98.8 kg)   BMI 32.16 kg/m    CC: R hip pain Subjective:    Patient ID: [name], male    DOB: [birth year], 69 y.o.   MRN: [id number]  HPI: [name] is a 70 y.o. male presenting on 04/23/2016 for Hip Pain (right; x 1 month)   1 mo h/o R hip pain initially posteriolateral buttock noticed when shopping, but now pain has localized to inner groin. Also feels sharp shock-like pain in groin with certain positions. Enjoys walking 3.5 miles a day - no longer able to do this. Now only using stationary bicycle. Walking backwards on eliptical is better tolerated than walking forward. Treating pain with ice and 2 aleve bid.   Denies inciting trauma/injury or falls. No prior h/o hip pain.   2 mo ago L arm pain - s/p MRI by ortho with elbow and shoulder arthritis. Missed f/u ortho appt this week. Sees Dr [name].   H/o bilateral knee replacements and L thumb replacement.  Denies synovitis.  H/o gout treated with colchicine PRN.   Relevant past medical, surgical, family and social history reviewed and updated as indicated. Interim medical history since our last visit reviewed. Allergies and medications reviewed and updated. Current Outpatient Prescriptions on File Prior to Visit  Medication Sig  . acetaminophen (TYLENOL) 500 MG tablet Take 500 mg by mouth every 6 (six) hours as needed.  . colchicine 0.6 MG tablet Take 1.2 mg PO, one hour later by another 0.6 mg on first day; then 0.6 mg twice daily until attack resolved.1  . fluticasone (FLONASE) 50 MCG/ACT nasal spray USE 2 SPRAYS INTO EACH NOSTRIL ONCE DAILY AS DIRECTED  . Multiple Vitamin (MULTIVITAMIN) tablet Take 1 tablet by mouth daily.  . pantoprazole (PROTONIX) 40 MG tablet Take 40 mg by mouth daily as needed (acod reflux).  . pravastatin (PRAVACHOL) 40 MG tablet TAKE 1 TABLET BY MOUTH DAILY  . PROAIR HFA 108 (90 Base) MCG/ACT inhaler INHALE 2  PUFFS EVERY 6 HOURS AS NEEDED FOR WHEEZING  . VIAGRA 100 MG tablet TAKE 1 TABLET BY MOUTH DAILY   AS NEEDEDFOR ERECTILE DYSFUNCTION   No current facility-administered medications on file prior to visit.     Review of Systems Per HPI unless specifically indicated in ROS section     Objective:    BP 140/80   Pulse 76   Temp 97.9 F (36.6 C) (Oral)   Wt 217 lb 12 oz (98.8 kg)   BMI 32.16 kg/m   Wt Readings from Last 3 Encounters:  04/23/16 217 lb 12 oz (98.8 kg)  01/17/16 212 lb 12 oz (96.5 kg)  01/02/16 211 lb (95.7 kg)    Physical Exam  Constitutional: He appears well-developed and well-nourished. No distress.  Musculoskeletal: He exhibits no edema.  Marked pain with int/ext rotation at R hip. LROM L knee after replacement, good ROM R knee without pain No pain at SIJ, GTB or sciatic notch bilaterally.  2+ DP bilaterally  Skin: Skin is warm and dry. No rash noted.  Psychiatric: He has a normal mood and affect.  Nursing note and vitals reviewed.  Lab Results  Component Value Date   CREATININE 1.11 06/26/2015       Assessment & Plan:   Problem List Items Addressed This Visit    Primary osteoarthritis of right hip - Primary  Exam most consistent with R hip OA. Baseline xray today. Treat with naprosyn BID and tramadol PRN breakthrough pain. Discussed limiting impact exercise. Will refer to ortho (pt sees Dr [name]).      Relevant Medications   naproxen (NAPROSYN) 500 MG tablet   traMADol (ULTRAM) 50 MG tablet   Other Relevant Orders   DG HIP UNILAT WITH PELVIS MIN 4 VIEWS RIGHT   Ambulatory referral to Orthopedic Surgery       Follow up plan: Return if symptoms worsen or fail to improve.  Richard Bush, MD

## 2016-06-25 ENCOUNTER — Other Ambulatory Visit: Payer: Self-pay | Admitting: Family Medicine

## 2016-06-27 ENCOUNTER — Telehealth: Payer: Self-pay

## 2016-06-27 DIAGNOSIS — M109 Gout, unspecified: Secondary | ICD-10-CM

## 2016-06-27 NOTE — Telephone Encounter (Signed)
Pt left note at front desk requesting refill colchicine to [facility]. Mable Paris NP filled last # 15 on 11/30/15.Please advise.  I spoke with pt to get some additional info and pt said he as already gotten the refill after going to an UC. Pt said he does not need refill now.

## 2016-07-16 ENCOUNTER — Encounter (INDEPENDENT_AMBULATORY_CARE_PROVIDER_SITE_OTHER): Payer: Self-pay

## 2016-07-16 ENCOUNTER — Ambulatory Visit (INDEPENDENT_AMBULATORY_CARE_PROVIDER_SITE_OTHER): Payer: Medicare Other

## 2016-07-16 VITALS — BP 130/82 | HR 64 | Temp 98.4°F | Ht 67.5 in | Wt 208.0 lb

## 2016-07-16 DIAGNOSIS — Z0000 Encounter for general adult medical examination without abnormal findings: Secondary | ICD-10-CM | POA: Diagnosis not present

## 2016-07-16 DIAGNOSIS — Z125 Encounter for screening for malignant neoplasm of prostate: Secondary | ICD-10-CM

## 2016-07-16 DIAGNOSIS — R7309 Other abnormal glucose: Secondary | ICD-10-CM | POA: Diagnosis not present

## 2016-07-16 DIAGNOSIS — M1000 Idiopathic gout, unspecified site: Secondary | ICD-10-CM | POA: Diagnosis not present

## 2016-07-16 DIAGNOSIS — E7800 Pure hypercholesterolemia, unspecified: Secondary | ICD-10-CM | POA: Diagnosis not present

## 2016-07-16 DIAGNOSIS — R799 Abnormal finding of blood chemistry, unspecified: Secondary | ICD-10-CM

## 2016-07-16 LAB — COMPREHENSIVE METABOLIC PANEL
ALT: 15 U/L (ref 0–53)
AST: 15 U/L (ref 0–37)
Albumin: 4.6 g/dL (ref 3.5–5.2)
Alkaline Phosphatase: 92 U/L (ref 39–117)
BUN: 20 mg/dL (ref 6–23)
CALCIUM: 9.7 mg/dL (ref 8.4–10.5)
CHLORIDE: 108 meq/L (ref 96–112)
CO2: 28 meq/L (ref 19–32)
Creatinine, Ser: 0.96 mg/dL (ref 0.40–1.50)
GFR: 81.88 mL/min (ref >60.00)
Glucose, Bld: 89 mg/dL (ref 70–99)
POTASSIUM: 4.2 meq/L (ref 3.5–5.1)
Sodium: 141 mEq/L (ref 135–145)
Total Bilirubin: 0.5 mg/dL (ref 0.2–1.2)
Total Protein: 7.1 g/dL (ref 6.0–8.3)

## 2016-07-16 LAB — LIPID PANEL
CHOLESTEROL: 178 mg/dL (ref 0–200)
HDL: 33.7 mg/dL — ABNORMAL LOW (ref >39.00)
LDL CALC: 125 mg/dL — AB (ref 0–99)
NonHDL: 144.41
Total CHOL/HDL Ratio: 5
Triglycerides: 99 mg/dL (ref 0.0–149.0)
VLDL: 19.8 mg/dL (ref 0.0–40.0)

## 2016-07-16 LAB — CBC WITH DIFFERENTIAL/PLATELET
Basophils Absolute: 0 10*3/uL (ref 0.0–0.1)
Basophils Relative: 0.6 % (ref 0.0–3.0)
Eosinophils Absolute: 0.6 10*3/uL (ref 0.0–0.7)
Eosinophils Relative: 7.3 % — ABNORMAL HIGH (ref 0.0–5.0)
HCT: 42.8 % (ref 39.0–52.0)
Hemoglobin: 14.7 g/dL (ref 13.0–17.0)
Lymphocytes Relative: 28 % (ref 12.0–46.0)
Lymphs Abs: 2.2 10*3/uL (ref 0.7–4.0)
MCHC: 34.3 g/dL (ref 30.0–36.0)
MCV: 87.7 fl (ref 78.0–100.0)
Monocytes Absolute: 0.6 10*3/uL (ref 0.1–1.0)
Monocytes Relative: 7 % (ref 3.0–12.0)
Neutro Abs: 4.6 10*3/uL (ref 1.4–7.7)
Neutrophils Relative %: 57.1 % (ref 43.0–77.0)
Platelets: 314 10*3/uL (ref 150.0–400.0)
RBC: 4.88 Mil/uL (ref 4.22–5.81)
RDW: 13.3 % (ref 11.5–15.5)
WBC: 8 10*3/uL (ref 4.0–10.5)

## 2016-07-16 LAB — PSA, MEDICARE: PSA: 0.8 ng/ml (ref 0.10–4.00)

## 2016-07-16 LAB — URIC ACID: URIC ACID, SERUM: 7.2 mg/dL (ref 4.0–7.8)

## 2016-07-16 LAB — HEMOGLOBIN A1C: Hgb A1c MFr Bld: 5.6 % (ref 4.6–6.5)

## 2016-07-16 NOTE — Progress Notes (Signed)
Subjective:   [name] is a 71 y.o. male who presents for Medicare Annual/Subsequent preventive examination.  Review of Systems:  N/A Cardiac Risk Factors include: advanced age (>1men, >58 women);obesity (BMI >30kg/m2);dyslipidemia;male gender     Objective:    Vitals: BP 130/82 (BP Location: Right Arm, Patient Position: Sitting, Cuff Size: Normal)   Pulse 64   Temp 98.4 F (36.9 C) (Oral)   Ht 5' 7.5" (1.715 m) Comment: SHOES  Wt 208 lb (94.3 kg)   SpO2 96%   BMI 32.10 kg/m   Body mass index is 32.1 kg/m.  Tobacco History  Smoking Status  . Never Smoker  Smokeless Tobacco  . Never Used     Counseling given: No   Past Medical History:  Diagnosis Date  . AR (allergic rhinitis)   . Arthritis    PAIN AND OA LEFT KNEE ;  S/P RIGHT TOTAL KNEE REPLACEMENT  . Complication of anesthesia    STATES SEVERE PAIN WAKING UP AFTER GENERAL ANESTHESIA; AND FEELS BAD.  . ED (erectile dysfunction)   . GERD (gastroesophageal reflux disease)   . Gout    per pt  . History of nephrolithiasis    seen Dr. [facility], last 02/2011  . Hypercholesterolemia   . RBBB (right bundle branch block)    Past Surgical History:  Procedure Laterality Date  . 2D echo  99991111   mild diastolic dysfunction, nl EF   . COLONOSCOPY  05/2006   normal, rpt 10 yrs [facility], [place])  . KNEE ARTHROSCOPY Right 2000  . L thumb joint replace Left 2005   GRAMIG   . Liberal  . TOTAL KNEE ARTHROPLASTY Right 12/2010  . TOTAL KNEE ARTHROPLASTY Left 05/08/2014   Procedure: LEFT TOTAL KNEE ARTHROPLASTY;  Surgeon: Gearlean Alf, MD;  Location: [facility];  Service: Orthopedics;  Laterality: Left;   Family History  Problem Relation Age of Onset  . Cancer Father     lung and brain (smoker)  . Stroke Paternal Grandmother 11    stroke or heart attack (unsure)  . Cancer Mother     lung (smoker)  . Cancer Maternal Grandmother     leukemia  . CAD Neg Hx     unsure (see  above)  . Diabetes Neg Hx    History  Sexual Activity  . Sexual activity: Yes    Outpatient Encounter Prescriptions as of 07/16/2016  Medication Sig  . acetaminophen (TYLENOL) 500 MG tablet Take 500 mg by mouth every 6 (six) hours as needed.  . calcium carbonate (TUMS - DOSED IN MG ELEMENTAL CALCIUM) 500 MG chewable tablet Chew 1 tablet by mouth as needed for indigestion or heartburn.  . colchicine 0.6 MG tablet Take 1.2 mg PO, one hour later by another 0.6 mg on first day; then 0.6 mg twice daily until attack resolved.1  . fluticasone (FLONASE) 50 MCG/ACT nasal spray USE 2 SPRAYS INTO EACH NOSTRIL ONCE DAILY AS DIRECTED  . Multiple Vitamin (MULTIVITAMIN) tablet Take 1 tablet by mouth daily.  . naproxen (NAPROSYN) 500 MG tablet Take one po bid x 1 week then prn pain, take with food  . pantoprazole (PROTONIX) 40 MG tablet Take 40 mg by mouth daily as needed (acod reflux).  . pravastatin (PRAVACHOL) 40 MG tablet TAKE 1 TABLET BY MOUTH DAILY  . PROAIR HFA 108 (90 Base) MCG/ACT inhaler INHALE 2 PUFFS EVERY 6 HOURS AS NEEDED FOR WHEEZING  . traMADol (ULTRAM) 50 MG tablet Take 1  tablet (50 mg total) by mouth 2 (two) times daily as needed.  Marland Kitchen VIAGRA 100 MG tablet TAKE ONE (1) TABLET BY MOUTH DAILY AS NEEDED FOR ERECTILE DYSFUNCTION.   No facility-administered encounter medications on file as of 07/16/2016.     Activities of Daily Living In your present state of health, do you have any difficulty performing the following activities: 07/16/2016  Hearing? N  Vision? N  Difficulty concentrating or making decisions? N  Walking or climbing stairs? N  Dressing or bathing? N  Doing errands, shopping? N  Preparing Food and eating ? N  Using the Toilet? N  In the past six months, have you accidently leaked urine? N  Do you have problems with loss of bowel control? N  Managing your Medications? N  Managing your Finances? N  Housekeeping or managing your Housekeeping? N  Some recent data might be  hidden    Patient Care Team: Ria Bush, MD as PCP - General (Family Medicine) Sharyne Peach, MD as Consulting Physician (Ophthalmology) Gaynelle Arabian, MD as Consulting Physician (Orthopedic Surgery)   Assessment:     Hearing Screening   125Hz  250Hz  500Hz  1000Hz  2000Hz  3000Hz  4000Hz  6000Hz  8000Hz   Right ear:   40 40 40  0    Left ear:   40 40 40  0    Vision Screening Comments: Last vision exam in July 2017 with Dr. Delman Cheadle   Exercise Activities and Dietary recommendations Current Exercise Habits: Home exercise routine, Type of exercise: walking;strength training/weights;Other - see comments (stationary bike), Time (Minutes): 60, Frequency (Times/Week): 5, Weekly Exercise (Minutes/Week): 300, Intensity: Moderate, Exercise limited by: None identified  Goals    . Increase physical activity          Starting 07/16/2016, I will continue to exercise for at least 60 min 5 days per week.       Fall Risk Fall Risk  07/16/2016 06/29/2015 06/21/2014 06/21/2013 06/21/2012  Falls in the past year? No No No No No   Depression Screen PHQ 2/9 Scores 07/16/2016 06/29/2015 06/21/2014 06/21/2013  PHQ - 2 Score 0 0 0 0    Cognitive Function MMSE - Mini Mental State Exam 07/16/2016  Orientation to time 5  Orientation to Place 5  Registration 3  Attention/ Calculation 0  Recall 3  Language- name 2 objects 0  Language- repeat 1  Language- follow 3 step command 3  Language- read & follow direction 0  Write a sentence 0  Copy design 0  Total score 20       PLEASE NOTE: A Mini-Cog screen was completed. Maximum score is 20. A value of 0 denotes this part of Folstein MMSE was not completed or the patient failed this part of the Mini-Cog screening.   Mini-Cog Screening Orientation to Time - Max 5 pts Orientation to Place - Max 5 pts Registration - Max 3 pts Recall - Max 3 pts Language Repeat - Max 1 pts Language Follow 3 Step Command - Max 3 pts'  Immunization History  Administered Date(s)  Administered  . Influenza Whole 02/20/2010, 02/26/2011  . Influenza,inj,Quad PF,36+ Mos 02/23/2013, 03/22/2014, 03/12/2015, 03/11/2016  . Pneumococcal Conjugate-13 06/21/2014  . Pneumococcal Polysaccharide-23 06/12/2010  . Td 07/24/2009   Screening Tests Health Maintenance  Topic Date Due  . DTaP/Tdap/Td (1 - Tdap) 07/25/2019 (Originally 07/25/2009)  . COLONOSCOPY  06/15/2026 (Originally 06/16/2016)  . TETANUS/TDAP  07/25/2019  . INFLUENZA VACCINE  Completed  . Hepatitis C Screening  Completed  . PNA vac Low  Risk Adult  Completed      Plan:    I have personally reviewed and addressed the Medicare Annual Wellness questionnaire and have noted the following in the patient's chart:  A. Medical and social history B. Use of alcohol, tobacco or illicit drugs  C. Current medications and supplements D. Functional ability and status E.  Nutritional status F.  Physical activity G. Advance directives H. List of other physicians I.  Hospitalizations, surgeries, and ER visits in previous 12 months J.  [place] to include hearing, vision, cognitive, depression L. Referrals and appointments - none  In addition, I have reviewed and discussed with patient certain preventive protocols, quality metrics, and best practice recommendations. A written personalized care plan for preventive services as well as general preventive health recommendations were provided to patient.  See attached scanned questionnaire for additional information.   Signed,   Lindell Noe, MHA, BS, LPN Health Coach

## 2016-07-16 NOTE — Progress Notes (Signed)
Pre visit review using our clinic review tool, if applicable. No additional management support is needed unless otherwise documented below in the visit note. 

## 2016-07-16 NOTE — Patient Instructions (Signed)
[name] , Thank you for taking time to come for your Medicare Wellness Visit. I appreciate your ongoing commitment to your health goals. Please review the following plan we discussed and let me know if I can assist you in the future.   These are the goals we discussed: Goals    . Increase physical activity          Starting 07/16/2016, I will continue to exercise for at least 60 min 5 days per week.        This is a list of the screening recommended for you and due dates:  Health Maintenance  Topic Date Due  . DTaP/Tdap/Td vaccine (1 - Tdap) 07/25/2019*  . Colon Cancer Screening  06/15/2026*  . Tetanus Vaccine  07/25/2019  . Flu Shot  Completed  .  Hepatitis C: One time screening is recommended by Center for Disease Control  (CDC) for  adults born from [birth year] through 1966.   Completed  . Pneumonia vaccines  Completed  *Topic was postponed. The date shown is not the original due date.   Preventive Care for Adults  A healthy lifestyle and preventive care can promote health and wellness. Preventive health guidelines for adults include the following key practices.  . A routine yearly physical is a good way to check with your health care provider about your health and preventive screening. It is a chance to share any concerns and updates on your health and to receive a thorough exam.  . Visit your dentist for a routine exam and preventive care every 6 months. Brush your teeth twice a day and floss once a day. Good oral hygiene prevents tooth decay and gum disease.  . The frequency of eye exams is based on your age, health, family medical history, use  of contact lenses, and other factors. Follow your health care provider's ecommendations for frequency of eye exams.  . Eat a healthy diet. Foods like vegetables, fruits, whole grains, low-fat dairy products, and lean protein foods contain the nutrients you need without too many calories. Decrease your intake of foods high in solid fats,  added sugars, and salt. Eat the right amount of calories for you. Get information about a proper diet from your health care provider, if necessary.  . Regular physical exercise is one of the most important things you can do for your health. Most adults should get at least 150 minutes of moderate-intensity exercise (any activity that increases your heart rate and causes you to sweat) each week. In addition, most adults need muscle-strengthening exercises on 2 or more days a week.  Silver Sneakers may be a benefit available to you. To determine eligibility, you may visit the website: www.silversneakers.com or contact program at 312-318-9959 Mon-Fri between 8AM-8PM.   . Maintain a healthy weight. The body mass index (BMI) is a screening tool to identify possible weight problems. It provides an estimate of body fat based on height and weight. Your health care provider can find your BMI and can help you achieve or maintain a healthy weight.   For adults 20 years and older: ? A BMI below 18.5 is considered underweight. ? A BMI of 18.5 to 24.9 is normal. ? A BMI of 25 to 29.9 is considered overweight. ? A BMI of 30 and above is considered obese.   . Maintain normal blood lipids and cholesterol levels by exercising and minimizing your intake of saturated fat. Eat a balanced diet with plenty of fruit and vegetables. Blood tests for  lipids and cholesterol should begin at age 94 and be repeated every 5 years. If your lipid or cholesterol levels are high, you are over 50, or you are at high risk for heart disease, you may need your cholesterol levels checked more frequently. Ongoing high lipid and cholesterol levels should be treated with medicines if diet and exercise are not working.  . If you smoke, find out from your health care provider how to quit. If you do not use tobacco, please do not start.  . If you choose to drink alcohol, please do not consume more than 2 drinks per day. One drink is  considered to be 12 ounces (355 mL) of beer, 5 ounces (148 mL) of wine, or 1.5 ounces (44 mL) of liquor.  . If you are 19-23 years old, ask your health care provider if you should take aspirin to prevent strokes.  . Use sunscreen. Apply sunscreen liberally and repeatedly throughout the day. You should seek shade when your shadow is shorter than you. Protect yourself by wearing long sleeves, pants, a wide-brimmed hat, and sunglasses year round, whenever you are outdoors.  . Once a month, do a whole body skin exam, using a mirror to look at the skin on your back. Tell your health care provider of new moles, moles that have irregular borders, moles that are larger than a pencil eraser, or moles that have changed in shape or color.

## 2016-07-16 NOTE — Progress Notes (Signed)
PCP notes:   Health maintenance:  Colon cancer screening - patient encouraged to discuss this topic with PCP at next appt  Abnormal screenings:   Hearing - failed  Patient concerns:   Patient reports he has planned right hip replacement on August 13, 2016.   Nurse concerns:  None  Next PCP appt:   07/18/16 @ 1400

## 2016-07-17 ENCOUNTER — Ambulatory Visit: Payer: Self-pay | Admitting: Orthopedic Surgery

## 2016-07-18 ENCOUNTER — Ambulatory Visit (INDEPENDENT_AMBULATORY_CARE_PROVIDER_SITE_OTHER): Payer: Medicare Other | Admitting: Family Medicine

## 2016-07-18 ENCOUNTER — Encounter: Payer: Self-pay | Admitting: Family Medicine

## 2016-07-18 ENCOUNTER — Ambulatory Visit (INDEPENDENT_AMBULATORY_CARE_PROVIDER_SITE_OTHER)
Admission: RE | Admit: 2016-07-18 | Discharge: 2016-07-18 | Disposition: A | Discharge status: Home / Self Care | Payer: Medicare Other | Source: Ambulatory Visit | Attending: Family Medicine | Admitting: Family Medicine

## 2016-07-18 VITALS — BP 126/68 | HR 72 | Temp 98.3°F | Wt 208.0 lb

## 2016-07-18 DIAGNOSIS — Z01818 Encounter for other preprocedural examination: Secondary | ICD-10-CM

## 2016-07-18 DIAGNOSIS — M109 Gout, unspecified: Secondary | ICD-10-CM | POA: Diagnosis not present

## 2016-07-18 DIAGNOSIS — Z7189 Other specified counseling: Secondary | ICD-10-CM | POA: Diagnosis not present

## 2016-07-18 DIAGNOSIS — E669 Obesity, unspecified: Secondary | ICD-10-CM

## 2016-07-18 DIAGNOSIS — E7800 Pure hypercholesterolemia, unspecified: Secondary | ICD-10-CM | POA: Diagnosis not present

## 2016-07-18 DIAGNOSIS — J45909 Unspecified asthma, uncomplicated: Secondary | ICD-10-CM | POA: Diagnosis not present

## 2016-07-18 DIAGNOSIS — G454 Transient global amnesia: Secondary | ICD-10-CM | POA: Diagnosis not present

## 2016-07-18 DIAGNOSIS — M1611 Unilateral primary osteoarthritis, right hip: Secondary | ICD-10-CM

## 2016-07-18 DIAGNOSIS — I4510 Unspecified right bundle-branch block: Secondary | ICD-10-CM

## 2016-07-18 DIAGNOSIS — K219 Gastro-esophageal reflux disease without esophagitis: Secondary | ICD-10-CM | POA: Diagnosis not present

## 2016-07-18 NOTE — Assessment & Plan Note (Signed)
Advanced directives - wife Jessamine then son (Carl Fairburn Teodoro) would be HCPOA.Brought today, reviewed and scanned 06/2016.  

## 2016-07-18 NOTE — Progress Notes (Signed)
Pre visit review using our clinic review tool, if applicable. No additional management support is needed unless otherwise documented below in the visit note. 

## 2016-07-18 NOTE — Assessment & Plan Note (Addendum)
Chronic. Recovering from recent gout flare treated with colchicine. Will defer ppx medication at this time as no recurrent flares.

## 2016-07-18 NOTE — Assessment & Plan Note (Signed)
H/o this 2015. No problems since.

## 2016-07-18 NOTE — Assessment & Plan Note (Signed)
Chronic, mildly elevated. Continue pravastatin.

## 2016-07-18 NOTE — Assessment & Plan Note (Signed)
H/o this first seen 2015. Update EKG.

## 2016-07-18 NOTE — Progress Notes (Signed)
BP 126/68   Pulse 72   Temp 98.3 F (36.8 C) (Oral)   Wt 208 lb (94.3 kg)   BMI 32.10 kg/m    CC: AMW f/u  Subjective:    Patient ID: [name], male    DOB: January 11, 1946, 71 y.o.   MRN: [id number]  HPI: [name] is a 71 y.o. male presenting on 07/18/2016 for Annual Exam   Saw [name] Wednesday for medicare wellness visit. Note reviewed.   Planned R hip replacement 07/2016 ([facility]). Intraarticular hip joint helped. Ongoing severe pain not relieved with medications. Requests preop clearance for hip replacement.   Recent gout flare treated by [facility] this month with colchicine.  Started weight watchers - lost 13 lbs.   Acute bronchitis with hypoxia treated with O2 and zpack by ER, fully resolved from this. Occasional inhaler use for RAD and chest congestion wiith URIs.   H/o several surgeries in the past, under GETA with increased pain upon awakening. Latest surgery was total L knee replacement 2015 - tolerated well with spinal anesthesia.   Denies current chest pain, tightness, dyspnea.   Preventative: Colonoscopy 2008 - normal, rpt 10 yrs Amedeo Plenty, [place]). Wants to avoid rpt colonoscopy. Interested in cologuard.  Prostate - checked yearly. Always normal.   Flu 02/2015 Pneumovax 05/2010, prevnar 2016  Td 07/2009 Shingles - interested in shingrix Advanced directives - wife [name] then son [name]) would be HCPOA.Brought today, reviewed and scanned 06/2016.  Seat belt use discussed No sunscreen use. No changing moles on skin.  Non smoker Alcohol - rare  Caffeine: occaisonally, 2 diet cokes/day Married, lives with wife Retired: Development worker, community Activity: Walks and runs 3 miles a day. at [place]. Stays active golfing.  Diet: good water, fruits/vegetables daily, avoiding carbs and watching portion sizes  Relevant past medical, surgical, family and social history reviewed and updated as indicated. Interim medical history since our last visit  reviewed. Allergies and medications reviewed and updated. Outpatient Medications Prior to Visit  Medication Sig Dispense Refill  . acetaminophen (TYLENOL) 500 MG tablet Take 500 mg by mouth every 6 (six) hours as needed.    . calcium carbonate (TUMS - DOSED IN MG ELEMENTAL CALCIUM) 500 MG chewable tablet Chew 1 tablet by mouth as needed for indigestion or heartburn.    . colchicine 0.6 MG tablet Take 1.2 mg PO, one hour later by another 0.6 mg on first day; then 0.6 mg twice daily until attack resolved.1 15 tablet 0  . fluticasone (FLONASE) 50 MCG/ACT nasal spray USE 2 SPRAYS INTO EACH NOSTRIL ONCE DAILY AS DIRECTED 16 g 9  . Multiple Vitamin (MULTIVITAMIN) tablet Take 1 tablet by mouth daily.    . naproxen (NAPROSYN) 500 MG tablet Take one po bid x 1 week then prn pain, take with food 40 tablet 0  . pantoprazole (PROTONIX) 40 MG tablet Take 40 mg by mouth daily as needed (acod reflux).    . pravastatin (PRAVACHOL) 40 MG tablet TAKE 1 TABLET BY MOUTH DAILY 30 tablet 11  . PROAIR HFA 108 (90 Base) MCG/ACT inhaler INHALE 2 PUFFS EVERY 6 HOURS AS NEEDED FOR WHEEZING 8.5 g 3  . traMADol (ULTRAM) 50 MG tablet Take 1 tablet (50 mg total) by mouth 2 (two) times daily as needed. 30 tablet 0  . VIAGRA 100 MG tablet TAKE ONE (1) TABLET BY MOUTH DAILY AS NEEDED FOR ERECTILE DYSFUNCTION. 10 tablet 0   No facility-administered medications prior to visit.     Past  Medical History:  Diagnosis Date  . AR (allergic rhinitis)   . Arthritis    PAIN AND OA LEFT KNEE ;  S/P RIGHT TOTAL KNEE REPLACEMENT  . Complication of anesthesia    STATES SEVERE PAIN WAKING UP AFTER GENERAL ANESTHESIA; AND FEELS BAD.  . ED (erectile dysfunction)   . GERD (gastroesophageal reflux disease)   . Gout    per pt  . History of nephrolithiasis    seen [facility]. [facility], last 02/2011  . Hypercholesterolemia   . RBBB (right bundle branch block)     Past Surgical History:  Procedure Laterality Date  . 2D echo  99991111   mild  diastolic dysfunction, nl EF   . COLONOSCOPY  05/2006   normal, rpt 10 yrs [facility] Plenty, [place])  . KNEE ARTHROSCOPY Right 2000  . L thumb joint replace Left 2005   GRAMIG   . [place]  . TOTAL KNEE ARTHROPLASTY Right 12/2010  . TOTAL KNEE ARTHROPLASTY Left 05/08/2014   Procedure: LEFT TOTAL KNEE ARTHROPLASTY;  Surgeon: Gearlean Alf, MD;  Location: [facility];  Service: Orthopedics;  Laterality: Left;    Family History  Problem Relation Age of Onset  . Cancer Father     lung and brain (smoker)  . Stroke Paternal Grandmother 37    stroke or heart attack (unsure)  . Cancer Mother     lung (smoker)  . Cancer Maternal Grandmother     leukemia  . CAD Neg Hx     unsure (see above)  . Diabetes Neg Hx    Social History  Substance Use Topics  . Smoking status: Never Smoker  . Smokeless tobacco: Never Used  . Alcohol use No    Per HPI unless specifically indicated in ROS section below Review of Systems     Objective:    BP 126/68   Pulse 72   Temp 98.3 F (36.8 C) (Oral)   Wt 208 lb (94.3 kg)   BMI 32.10 kg/m   Wt Readings from Last 3 Encounters:  07/18/16 208 lb (94.3 kg)  07/16/16 208 lb (94.3 kg)  04/23/16 217 lb 12 oz (98.8 kg)    Physical Exam  Constitutional: He is oriented to person, place, and time. He appears well-developed and well-nourished. No distress.  HENT:  Head: Normocephalic and atraumatic.  Right Ear: Hearing, tympanic membrane, external ear and ear canal normal.  Left Ear: Hearing, tympanic membrane, external ear and ear canal normal.  Nose: Nose normal.  Mouth/Throat: Uvula is midline, oropharynx is clear and moist and mucous membranes are normal. No oropharyngeal exudate, posterior oropharyngeal edema or posterior oropharyngeal erythema.  Eyes: Conjunctivae and EOM are normal. Pupils are equal, round, and reactive to light. No scleral icterus.  Neck: Normal range of motion. Neck supple. Carotid bruit is not present. No  thyromegaly present.  Cardiovascular: Normal rate, regular rhythm, normal heart sounds and intact distal pulses.   No murmur heard. Pulses:      Radial pulses are 2+ on the right side, and 2+ on the left side.  Pulmonary/Chest: Effort normal and breath sounds normal. No respiratory distress. He has no wheezes. He has no rales.  Abdominal: Soft. Bowel sounds are normal. He exhibits no distension and no mass. There is no tenderness. There is no rebound and no guarding.  Genitourinary: Rectum normal and prostate normal. Rectal exam shows no external hemorrhoid, no fissure, no mass, no tenderness and anal tone normal. Prostate is not enlarged (20gm) and  not tender.  Musculoskeletal: Normal range of motion. He exhibits no edema.  Lymphadenopathy:    He has no cervical adenopathy.  Neurological: He is alert and oriented to person, place, and time.  CN grossly intact, station and gait intact  Skin: Skin is warm and dry. No rash noted.  Psychiatric: He has a normal mood and affect. His behavior is normal. Judgment and thought content normal.  Nursing note and vitals reviewed.  Results for orders placed or performed in visit on 07/16/16  Lipid Panel  Result Value Ref Range   Cholesterol 178 0 - 200 mg/dL   Triglycerides 99.0 0.0 - 149.0 mg/dL   HDL 33.70 (L) >39.00 mg/dL   VLDL 19.8 0.0 - 40.0 mg/dL   LDL Cholesterol 125 (H) 0 - 99 mg/dL   Total CHOL/HDL Ratio 5    NonHDL 144.41   Hemoglobin A1c  Result Value Ref Range   Hgb A1c MFr Bld 5.6 4.6 - 6.5 %  CBC with Differential/Platelet  Result Value Ref Range   WBC 8.0 4.0 - 10.5 K/uL   RBC 4.88 4.22 - 5.81 Mil/uL   Hemoglobin 14.7 13.0 - 17.0 g/dL   HCT 42.8 39.0 - 52.0 %   MCV 87.7 78.0 - 100.0 fl   MCHC 34.3 30.0 - 36.0 g/dL   RDW 13.3 11.5 - 15.5 %   Platelets 314.0 150.0 - 400.0 K/uL   Neutrophils Relative % 57.1 43.0 - 77.0 %   Lymphocytes Relative 28.0 12.0 - 46.0 %   Monocytes Relative 7.0 3.0 - 12.0 %   Eosinophils Relative  7.3 (H) 0.0 - 5.0 %   Basophils Relative 0.6 0.0 - 3.0 %   Neutro Abs 4.6 1.4 - 7.7 K/uL   Lymphs Abs 2.2 0.7 - 4.0 K/uL   Monocytes Absolute 0.6 0.1 - 1.0 K/uL   Eosinophils Absolute 0.6 0.0 - 0.7 K/uL   Basophils Absolute 0.0 0.0 - 0.1 K/uL  Uric acid  Result Value Ref Range   Uric Acid, Serum 7.2 4.0 - 7.8 mg/dL  PSA, Medicare  Result Value Ref Range   PSA 0.80 0.10 - 4.00 ng/ml  Comprehensive metabolic panel  Result Value Ref Range   Sodium 141 135 - 145 mEq/L   Potassium 4.2 3.5 - 5.1 mEq/L   Chloride 108 96 - 112 mEq/L   CO2 28 19 - 32 mEq/L   Glucose, Bld 89 70 - 99 mg/dL   BUN 20 6 - 23 mg/dL   Creatinine, Ser 0.96 0.40 - 1.50 mg/dL   Total Bilirubin 0.5 0.2 - 1.2 mg/dL   Alkaline Phosphatase 92 39 - 117 U/L   AST 15 0 - 37 U/L   ALT 15 0 - 53 U/L   Total Protein 7.1 6.0 - 8.3 g/dL   Albumin 4.6 3.5 - 5.2 g/dL   Calcium 9.7 8.4 - 10.5 mg/dL   GFR 81.88 >60.00 mL/min   CHEST  2 VIEW COMPARISON:  01/02/2016 and 12/06/2012 FINDINGS: The heart size and mediastinal contours are within normal limits. Small calcified granulomas again seen in the left upper lobe. No evidence of pulmonary infiltrate or edema. No evidence of pleural effusion. IMPRESSION: Stable exam.  No active cardiopulmonary disease. Electronically Signed   By: Earle Gell M.D.   On: 07/18/2016 19:34  EKG - NSR rate 60s, normal axis, intervalss, no acute ST/T changes, RBBB unchanged from last EKG 2015    Assessment & Plan:   Problem List Items Addressed This Visit  Advanced care planning/counseling discussion    Advanced directives - wife [name] then son Ostin Seebeck) would be HCPOA.Brought today, reviewed and scanned 06/2016.       GERD (gastroesophageal reflux disease)    This has improved with healthy diet changes. Not using tums or protonix regularly.      Gout    Chronic. Recovering from recent gout flare treated with colchicine. Will defer ppx medication at this time as no  recurrent flares.       HYPERCHOLESTEROLEMIA    Chronic, mildly elevated. Continue pravastatin.       Obesity, Class I, BMI 30-34.9    Congratulated on healthy diet changes to date to affect weight loss.      Preoperative clearance - Primary    Will check EKG, CXR today - see above.  Revised Cardiac Risk Index score = 0, no further testing indicated. Adequately low risk to proceed with upcoming surgery. Has tolerate GETA in the past, but had increased pain after awakening. Did better with spinal anesthesia last surgery.  Recommend albuterol inhaler available given h/o RAD with URIs but will not likely need this scheduled.       Relevant Orders   DG Chest 2 View (Completed)   EKG 12-Lead (Completed)   Primary osteoarthritis of right hip    Upcoming R hip replacement by Dr Maureen Ralphs. preop clearance provided today.       RBBB    H/o this first seen 2015. Update EKG.       Relevant Orders   EKG 12-Lead (Completed)   Transient global amnesia    H/o this 2015. No problems since.          Follow up plan: Return in about 1 year (around 07/18/2017), or if symptoms worsen or fail to improve, for medicare wellness visit.  Ria Bush, MD

## 2016-07-18 NOTE — Assessment & Plan Note (Signed)
This has improved with healthy diet changes. Not using tums or protonix regularly.

## 2016-07-18 NOTE — Assessment & Plan Note (Signed)
Upcoming R hip replacement by Dr Maureen Ralphs. preop clearance provided today.

## 2016-07-18 NOTE — Assessment & Plan Note (Signed)
Congratulated on healthy diet changes to date to affect weight loss.

## 2016-07-18 NOTE — Patient Instructions (Addendum)
We will sign you up for cologuard.  EKG and chest xray today.  We will fill out preop clearance. Thanks for bringing in advanced directive - we will update chart. Return as needed or in 1 year for next medicare wellness visit   Health Maintenance, Male A healthy lifestyle and preventative care can promote health and wellness.  Maintain regular health, dental, and eye exams.  Eat a healthy diet. Foods like vegetables, fruits, whole grains, low-fat dairy products, and lean protein foods contain the nutrients you need and are low in calories. Decrease your intake of foods high in solid fats, added sugars, and salt. Get information about a proper diet from your health care provider, if necessary.  Regular physical exercise is one of the most important things you can do for your health. Most adults should get at least 150 minutes of moderate-intensity exercise (any activity that increases your heart rate and causes you to sweat) each week. In addition, most adults need muscle-strengthening exercises on 2 or more days a week.   Maintain a healthy weight. The body mass index (BMI) is a screening tool to identify possible weight problems. It provides an estimate of body fat based on height and weight. Your health care provider can find your BMI and can help you achieve or maintain a healthy weight. For males 20 years and older:  A BMI below 18.5 is considered underweight.  A BMI of 18.5 to 24.9 is normal.  A BMI of 25 to 29.9 is considered overweight.  A BMI of 30 and above is considered obese.  Maintain normal blood lipids and cholesterol by exercising and minimizing your intake of saturated fat. Eat a balanced diet with plenty of fruits and vegetables. Blood tests for lipids and cholesterol should begin at age 50 and be repeated every 5 years. If your lipid or cholesterol levels are high, you are over age 49, or you are at high risk for heart disease, you may need your cholesterol levels checked  more frequently.Ongoing high lipid and cholesterol levels should be treated with medicines if diet and exercise are not working.  If you smoke, find out from your health care provider how to quit. If you do not use tobacco, do not start.  Lung cancer screening is recommended for adults aged 32-80 years who are at high risk for developing lung cancer because of a history of smoking. A yearly low-dose CT scan of the lungs is recommended for people who have at least a 30-pack-year history of smoking and are current smokers or have quit within the past 15 years. A pack year of smoking is smoking an average of 1 pack of cigarettes a day for 1 year (for example, a 30-pack-year history of smoking could mean smoking 1 pack a day for 30 years or 2 packs a day for 15 years). Yearly screening should continue until the smoker has stopped smoking for at least 15 years. Yearly screening should be stopped for people who develop a health problem that would prevent them from having lung cancer treatment.  If you choose to drink alcohol, do not have more than 2 drinks per day. One drink is considered to be 12 oz (360 mL) of beer, 5 oz (150 mL) of wine, or 1.5 oz (45 mL) of liquor.  Avoid the use of street drugs. Do not share needles with anyone. Ask for help if you need support or instructions about stopping the use of drugs.  High blood pressure causes heart disease  and increases the risk of stroke. High blood pressure is more likely to develop in:  People who have blood pressure in the end of the normal range (100-139/85-89 mm Hg).  People who are overweight or obese.  People who are African American.  If you are 17-34 years of age, have your blood pressure checked every 3-5 years. If you are 84 years of age or older, have your blood pressure checked every year. You should have your blood pressure measured twice-once when you are at a hospital or clinic, and once when you are not at a hospital or clinic. Record  the average of the two measurements. To check your blood pressure when you are not at a hospital or clinic, you can use:  An automated blood pressure machine at a pharmacy.  A home blood pressure monitor.  If you are 58-32 years old, ask your health care provider if you should take aspirin to prevent heart disease.  Diabetes screening involves taking a blood sample to check your fasting blood sugar level. This should be done once every 3 years after age 35 if you are at a normal weight and without risk factors for diabetes. Testing should be considered at a younger age or be carried out more frequently if you are overweight and have at least 1 risk factor for diabetes.  Colorectal cancer can be detected and often prevented. Most routine colorectal cancer screening begins at the age of 30 and continues through age 75. However, your health care provider may recommend screening at an earlier age if you have risk factors for colon cancer. On a yearly basis, your health care provider may provide home test kits to check for hidden blood in the stool. A small camera at the end of a tube may be used to directly examine the colon (sigmoidoscopy or colonoscopy) to detect the earliest forms of colorectal cancer. Talk to your health care provider about this at age 78 when routine screening begins. A direct exam of the colon should be repeated every 5-10 years through age 43, unless early forms of precancerous polyps or small growths are found.  People who are at an increased risk for hepatitis B should be screened for this virus. You are considered at high risk for hepatitis B if:  You were born in a country where hepatitis B occurs often. Talk with your health care provider about which countries are considered high risk.  Your parents were born in a high-risk country and you have not received a shot to protect against hepatitis B (hepatitis B vaccine).  You have HIV or AIDS.  You use needles to inject  street drugs.  You live with, or have sex with, someone who has hepatitis B.  You are a man who has sex with other men (MSM).  You get hemodialysis treatment.  You take certain medicines for conditions like cancer, organ transplantation, and autoimmune conditions.  Hepatitis C blood testing is recommended for all people born from [birth year] through 1966 and any individual with known risk factors for hepatitis C.  Healthy men should no longer receive prostate-specific antigen (PSA) blood tests as part of routine cancer screening. Talk to your health care provider about prostate cancer screening.  Testicular cancer screening is not recommended for adolescents or adult males who have no symptoms. Screening includes self-exam, a health care provider exam, and other screening tests. Consult with your health care provider about any symptoms you have or any concerns you have about testicular cancer.  Practice safe sex. Use condoms and avoid high-risk sexual practices to reduce the spread of sexually transmitted infections (STIs).  You should be screened for STIs, including gonorrhea and chlamydia if:  You are sexually active and are younger than 24 years.  You are older than 24 years, and your health care provider tells you that you are at risk for this type of infection.  Your sexual activity has changed since you were last screened, and you are at an increased risk for chlamydia or gonorrhea. Ask your health care provider if you are at risk.  If you are at risk of being infected with HIV, it is recommended that you take a prescription medicine daily to prevent HIV infection. This is called pre-exposure prophylaxis (PrEP). You are considered at risk if:  You are a man who has sex with other men (MSM).  You are a heterosexual man who is sexually active with multiple partners.  You take drugs by injection.  You are sexually active with a partner who has HIV.  Talk with your health care provider  about whether you are at high risk of being infected with HIV. If you choose to begin PrEP, you should first be tested for HIV. You should then be tested every 3 months for as long as you are taking PrEP.  Use sunscreen. Apply sunscreen liberally and repeatedly throughout the day. You should seek shade when your shadow is shorter than you. Protect yourself by wearing long sleeves, pants, a wide-brimmed hat, and sunglasses year round whenever you are outdoors.  Tell your health care provider of new moles or changes in moles, especially if there is a change in shape or color. Also, tell your health care provider if a mole is larger than the size of a pencil eraser.  A one-time screening for abdominal aortic aneurysm (AAA) and surgical repair of large AAAs by ultrasound is recommended for men aged 29-75 years who are current or former smokers.  Stay current with your vaccines (immunizations). This information is not intended to replace advice given to you by your health care provider. Make sure you discuss any questions you have with your health care provider. Document Released: 11/08/2007 Document Revised: 06/02/2014 Document Reviewed: 02/13/2015 Elsevier Interactive Patient Education  2017 [employer].

## 2016-07-19 NOTE — Assessment & Plan Note (Signed)
Will check EKG, CXR today - see above.  Revised Cardiac Risk Index score = 0, no further testing indicated. Adequately low risk to proceed with upcoming surgery. Has tolerate GETA in the past, but had increased pain after awakening. Did better with spinal anesthesia last surgery.  Recommend albuterol inhaler available given h/o RAD with URIs but will not likely need this scheduled.

## 2016-07-20 NOTE — Progress Notes (Signed)
I reviewed health advisor's note, was available for consultation, and agree with documentation and plan.  

## 2016-07-30 DIAGNOSIS — Z1212 Encounter for screening for malignant neoplasm of rectum: Secondary | ICD-10-CM | POA: Diagnosis not present

## 2016-07-30 DIAGNOSIS — Z1211 Encounter for screening for malignant neoplasm of colon: Secondary | ICD-10-CM | POA: Diagnosis not present

## 2016-08-04 ENCOUNTER — Other Ambulatory Visit (HOSPITAL_COMMUNITY): Payer: Self-pay | Admitting: Emergency Medicine

## 2016-08-04 NOTE — Progress Notes (Signed)
Medical clearance 07-18-16 Dr Danise Mina epic CMP/CBCdiff/HgA1C 07-16-16 epic CXR 07-18-16 epic EKG 07-18-16 epic

## 2016-08-04 NOTE — Patient Instructions (Addendum)
[name]  08/04/2016   Your procedure is scheduled on: 08-13-16  Report to [facility] Main  Entrance take [facility]  elevators to 3rd floor to  [place] at 1230PM.  Call this number if you have problems the morning of surgery [phone]   Remember: ONLY 1 PERSON MAY GO WITH YOU TO SHORT STAY TO GET  READY MORNING OF [place].  Do not eat food After Midnight. You may have  Clear liquids from midnight until 830am day of surgery. Nothing by motuh after 830am!!     Take these medicines the morning of surgery with A SIP OF WATER:tylenol as needed, nasal spray as needed, inhaler as needed                                You may not have any metal on your body including hair pins and              piercings  Do not wear jewelry, make-up, lotions, powders or perfumes, deodorant             Do not wear nail polish.  Do not shave  48 hours prior to surgery.              Men may shave face and neck.   Do not bring valuables to the hospital. [name].  Contacts, dentures or bridgework may not be worn into surgery.  Leave suitcase in the car. After surgery it may be brought to your room.                Please read over the following fact sheets you were given: _____________________________________________________________________    CLEAR LIQUID DIET   Foods Allowed                                                                     Foods Excluded  Coffee and tea, regular and decaf                             liquids that you cannot  Plain Jell-O in any flavor                                             see through such as: Fruit ices (not with fruit pulp)                                     milk, soups, orange juice  Iced Popsicles                                    All solid food Carbonated beverages, regular and diet  Cranberry, grape and apple juices Sports drinks like  Gatorade Lightly seasoned clear broth or consume(fat free) Sugar, honey syrup  Sample Menu Breakfast                                Lunch                                     Supper Cranberry juice                    Beef broth                            Chicken broth Jell-O                                     Grape juice                           Apple juice Coffee or tea                        Jell-O                                      Popsicle                                                Coffee or tea                        Coffee or tea  _____________________________________________________________________              [facility] - Preparing for Surgery Before surgery, you can play an important role.  Because skin is not sterile, your skin needs to be as free of germs as possible.  You can reduce the number of germs on your skin by washing with CHG (chlorahexidine gluconate) soap before surgery.  CHG is an antiseptic cleaner which kills germs and bonds with the skin to continue killing germs even after washing. Please DO NOT use if you have an allergy to CHG or antibacterial soaps.  If your skin becomes reddened/irritated stop using the CHG and inform your nurse when you arrive at Short Stay. Do not shave (including legs and underarms) for at least 48 hours prior to the first CHG shower.  You may shave your face/neck. Please follow these instructions carefully:  1.  Shower with CHG Soap the night before surgery and the  morning of Surgery.  2.  If you choose to wash your hair, wash your hair first as usual with your  normal  shampoo.  3.  After you shampoo, rinse your hair and body thoroughly to remove the  shampoo.                           4.  Use CHG as you would any other liquid soap.  You can apply  chg directly  to the skin and wash                       Gently with a scrungie or clean washcloth.  5.  Apply the CHG Soap to your body ONLY FROM THE NECK DOWN.   Do not use on face/  open                           Wound or open sores. Avoid contact with eyes, ears mouth and genitals (private parts).                       Wash face,  Genitals (private parts) with your normal soap.             6.  Wash thoroughly, paying special attention to the area where your surgery  will be performed.  7.  Thoroughly rinse your body with warm water from the neck down.  8.  DO NOT shower/wash with your normal soap after using and rinsing off  the CHG Soap.                9.  Pat yourself dry with a clean towel.            10.  Wear clean pajamas.            11.  Place clean sheets on your bed the night of your first shower and do not  sleep with pets. Day of Surgery : Do not apply any lotions/deodorants the morning of surgery.  Please wear clean clothes to the hospital/surgery center.  FAILURE TO FOLLOW THESE INSTRUCTIONS MAY RESULT IN THE CANCELLATION OF YOUR SURGERY PATIENT SIGNATURE_________________________________  NURSE SIGNATURE__________________________________  ________________________________________________________________________   [name]  An incentive spirometer is a tool that can help keep your lungs clear and active. This tool measures how well you are filling your lungs with each breath. Taking long deep breaths may help reverse or decrease the chance of developing breathing (pulmonary) problems (especially infection) following:  A long period of time when you are unable to move or be active. BEFORE THE PROCEDURE   If the spirometer includes an indicator to show your best effort, your nurse or respiratory therapist will set it to a desired goal.  If possible, sit up straight or lean slightly forward. Try not to slouch.  Hold the incentive spirometer in an upright position. INSTRUCTIONS FOR USE  1. Sit on the edge of your bed if possible, or sit up as far as you can in bed or on a chair. 2. Hold the incentive spirometer in an upright  position. 3. Breathe out normally. 4. Place the mouthpiece in your mouth and seal your lips tightly around it. 5. Breathe in slowly and as deeply as possible, raising the piston or the ball toward the top of the column. 6. Hold your breath for 3-5 seconds or for as long as possible. Allow the piston or ball to fall to the bottom of the column. 7. Remove the mouthpiece from your mouth and breathe out normally. 8. Rest for a few seconds and repeat Steps 1 through 7 at least 10 times every 1-2 hours when you are awake. Take your time and take a few normal breaths between deep breaths. 9. The spirometer may include an indicator to show your best effort. Use the indicator as a goal to work toward during each  repetition. 10. After each set of 10 deep breaths, practice coughing to be sure your lungs are clear. If you have an incision (the cut made at the time of surgery), support your incision when coughing by placing a pillow or rolled up towels firmly against it. Once you are able to get out of bed, walk around indoors and cough well. You may stop using the incentive spirometer when instructed by your caregiver.  RISKS AND COMPLICATIONS  Take your time so you do not get dizzy or light-headed.  If you are in pain, you may need to take or ask for pain medication before doing incentive spirometry. It is harder to take a deep breath if you are having pain. AFTER USE  Rest and breathe slowly and easily.  It can be helpful to keep track of a log of your progress. Your caregiver can provide you with a simple table to help with this. If you are using the spirometer at home, follow these instructions: [place] IF:   You are having difficultly using the spirometer.  You have trouble using the spirometer as often as instructed.  Your pain medication is not giving enough relief while using the spirometer.  You develop fever of 100.5 F (38.1 C) or higher. SEEK IMMEDIATE MEDICAL CARE IF:    You cough up bloody sputum that had not been present before.  You develop fever of 102 F (38.9 C) or greater.  You develop worsening pain at or near the incision site. MAKE SURE YOU:   Understand these instructions.  Will watch your condition.  Will get help right away if you are not doing well or get worse. Document Released: 09/22/2006 Document Revised: 08/04/2011 Document Reviewed: 11/23/2006 ExitCare Patient Information 2014 [employer], [place].   ________________________________________________________________________  WHAT IS A BLOOD TRANSFUSION? Blood Transfusion Information  A transfusion is the replacement of blood or some of its parts. Blood is made up of multiple cells which provide different functions.  Red blood cells carry oxygen and are used for blood loss replacement.  White blood cells fight against infection.  Platelets control bleeding.  Plasma helps clot blood.  Other blood products are available for specialized needs, such as hemophilia or other clotting disorders. BEFORE THE TRANSFUSION  Who gives blood for transfusions?   Healthy volunteers who are fully evaluated to make sure their blood is safe. This is blood bank blood. Transfusion therapy is the safest it has ever been in the practice of medicine. Before blood is taken from a donor, a complete history is taken to make sure that person has no history of diseases nor engages in risky social behavior (examples are intravenous drug use or sexual activity with multiple partners). The donor's travel history is screened to minimize risk of transmitting infections, such as malaria. The donated blood is tested for signs of infectious diseases, such as HIV and hepatitis. The blood is then tested to be sure it is compatible with you in order to minimize the chance of a transfusion reaction. If you or a relative donates blood, this is often done in anticipation of surgery and is not appropriate for emergency  situations. It takes many days to process the donated blood. RISKS AND COMPLICATIONS Although transfusion therapy is very safe and saves many lives, the main dangers of transfusion include:   Getting an infectious disease.  Developing a transfusion reaction. This is an allergic reaction to something in the blood you were given. Every precaution is taken to prevent this.  The decision to have a blood transfusion has been considered carefully by your caregiver before blood is given. Blood is not given unless the benefits outweigh the risks. AFTER THE TRANSFUSION  Right after receiving a blood transfusion, you will usually feel much better and more energetic. This is especially true if your red blood cells have gotten low (anemic). The transfusion raises the level of the red blood cells which carry oxygen, and this usually causes an energy increase.  The nurse administering the transfusion will monitor you carefully for complications. HOME CARE INSTRUCTIONS  No special instructions are needed after a transfusion. You may find your energy is better. Speak with your caregiver about any limitations on activity for underlying diseases you may have. SEEK MEDICAL CARE IF:   Your condition is not improving after your transfusion.  You develop redness or irritation at the intravenous (IV) site. SEEK IMMEDIATE MEDICAL CARE IF:  Any of the following symptoms occur over the next 12 hours:  Shaking chills.  You have a temperature by mouth above 102 F (38.9 C), not controlled by medicine.  Chest, back, or muscle pain.  People around you feel you are not acting correctly or are confused.  Shortness of breath or difficulty breathing.  Dizziness and fainting.  You get a rash or develop hives.  You have a decrease in urine output.  Your urine turns a dark color or changes to pink, red, or brown. Any of the following symptoms occur over the next 10 days:  You have a temperature by mouth above  102 F (38.9 C), not controlled by medicine.  Shortness of breath.  Weakness after normal activity.  The white part of the eye turns yellow (jaundice).  You have a decrease in the amount of urine or are urinating less often.  Your urine turns a dark color or changes to pink, red, or brown. Document Released: 05/09/2000 Document Revised: 08/04/2011 Document Reviewed: 12/27/2007 [facility] Patient Information 2014 [place], [place].  _______________________________________________________________________

## 2016-08-05 ENCOUNTER — Encounter (HOSPITAL_COMMUNITY)
Admission: RE | Admit: 2016-08-05 | Discharge: 2016-08-05 | Disposition: A | Discharge status: Home / Self Care | Payer: Medicare Other | Source: Ambulatory Visit | Attending: Orthopedic Surgery | Admitting: Orthopedic Surgery

## 2016-08-05 ENCOUNTER — Encounter (HOSPITAL_COMMUNITY): Payer: Self-pay

## 2016-08-05 DIAGNOSIS — Z01812 Encounter for preprocedural laboratory examination: Secondary | ICD-10-CM | POA: Insufficient documentation

## 2016-08-05 DIAGNOSIS — M1611 Unilateral primary osteoarthritis, right hip: Secondary | ICD-10-CM | POA: Diagnosis not present

## 2016-08-05 DIAGNOSIS — Z0183 Encounter for blood typing: Secondary | ICD-10-CM | POA: Insufficient documentation

## 2016-08-05 HISTORY — DX: Personal history of urinary calculi: Z87.442

## 2016-08-05 LAB — SURGICAL PCR SCREEN
MRSA, PCR: NEGATIVE
STAPHYLOCOCCUS AUREUS: NEGATIVE

## 2016-08-05 LAB — PROTIME-INR
INR: 1.01
Prothrombin Time: 13.3 seconds (ref 11.4–15.2)

## 2016-08-05 LAB — APTT: aPTT: 29 seconds (ref 24–36)

## 2016-08-07 LAB — COLOGUARD: Cologuard: NEGATIVE

## 2016-08-12 NOTE — H&P (Signed)
TOTAL HIP ADMISSION H&P  Patient is admitted for right total hip arthroplasty.  Subjective:  Chief Complaint: right hip pain  HPI: [name], 71 y.o. male, has a history of pain and functional disability in the right hip(s) due to arthritis and patient has failed non-surgical conservative treatments for greater than 12 weeks to include NSAID's and/or analgesics, corticosteriod injections and activity modification.  Onset of symptoms was gradual starting 1 year ago with gradually worsening course since that time.The patient noted no past surgery on the right hip(s).  Patient currently rates pain in the right hip at 8 out of 10 with activity. Patient has night pain, worsening of pain with activity and weight bearing, pain that interfers with activities of daily living and pain with passive range of motion. Patient has evidence of subchondral cysts, periarticular osteophytes and joint space narrowing by imaging studies. This condition presents safety issues increasing the risk of falls.  There is no current active infection.  Patient Active Problem List   Diagnosis Date Noted  . Primary osteoarthritis of right hip 04/23/2016  . Gout 11/30/2015  . Actinic keratosis 06/29/2015  . Obesity, Class I, BMI 30-34.9 06/29/2015  . Advanced care planning/counseling discussion 06/21/2014  . OA (osteoarthritis) of knee 05/08/2014  . GERD (gastroesophageal reflux disease) 05/04/2014  . Preoperative clearance 01/18/2014  . RBBB 01/18/2014  . 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 Impaired fasting blood sugar 06/16/2011  . Kidney stone 03/24/2011  . Constipation 03/24/2011  . HYPERCHOLESTEROLEMIA 04/16/2009  . ALLERGIC RHINITIS 04/16/2009   Past Medical History:  Diagnosis Date  . AR (allergic rhinitis)   . Arthritis    PAIN AND OA LEFT KNEE ;  S/P RIGHT TOTAL KNEE REPLACEMENT  . Complication of anesthesia    STATES SEVERE PAIN WAKING UP AFTER GENERAL ANESTHESIA; AND FEELS BAD.  . ED  (erectile dysfunction)   . GERD (gastroesophageal reflux disease)   . Gout    per pt  . History of kidney stones   . History of nephrolithiasis    seen Dr. [facility], last 02/2011  . Hypercholesterolemia   . RBBB (right bundle branch block)     Past Surgical History:  Procedure Laterality Date  . 2D echo  83/41   mild diastolic dysfunction, nl EF   . COLONOSCOPY  05/2006   normal, rpt 10 yrs [facility], [place])  . KNEE ARTHROSCOPY Right 2000  . L thumb joint replace Left 2005   GRAMIG   . [place]  . TOTAL KNEE ARTHROPLASTY Right 12/2010  . TOTAL KNEE ARTHROPLASTY Left 05/08/2014   Procedure: LEFT TOTAL KNEE ARTHROPLASTY;  Surgeon: Gearlean Alf, MD;  Location: [facility];  Service: Orthopedics;  Laterality: Left;      Current Outpatient Prescriptions:  .  acetaminophen (TYLENOL) 500 MG tablet, Take 1,000 mg by mouth daily as needed for headache. , Disp: , Rfl:  .  amoxicillin (AMOXIL) 500 MG capsule, Take 2,000 mg by mouth as directed. Take 1 hour before dental procedures, Disp: , Rfl:  .  colchicine 0.6 MG tablet, Take 1.2 mg PO, one hour later by another 0.6 mg on first day; then 0.6 mg twice daily until attack resolved.1 (Patient taking differently: Take 0.6 mg by mouth every 8 (eight) hours as needed (gout flares). ), Disp: 15 tablet, Rfl: 0 .  fluticasone (FLONASE) 50 MCG/ACT nasal spray, USE 2 SPRAYS INTO EACH NOSTRIL ONCE DAILY AS DIRECTED (Patient taking differently: USE 2 SPRAYS INTO EACH NOSTRIL  ONCE DAILY AS NEEDED FOR CONGESTION), Disp: 16 g, Rfl: 9 .  HYDROcodone-acetaminophen (NORCO/VICODIN) 5-325 MG tablet, Take 1 tablet by mouth every 6 (six) hours as needed for moderate pain., Disp: , Rfl:  .  indomethacin (INDOCIN) 50 MG capsule, Take 50 mg by mouth 3 (three) times daily as needed (gout)., Disp: ,  .  naproxen (NAPROSYN) 500 MG tablet, Take one po bid x 1 week then prn pain, take with food (Patient taking differently: Take 500 mg by mouth  daily as needed for mild pain or moderate pain. With food), Disp: 40 tablet, Rfl: 0 .  naproxen sodium (ANAPROX) 220 MG tablet, Take 440 mg by mouth daily as needed (pain)., Disp: , Rfl:  .  pantoprazole (PROTONIX) 40 MG tablet, Take 40 mg by mouth daily as needed (acid reflux). , Disp: , Rfl:  .  PROAIR HFA 108 (90 Base) MCG/ACT inhaler, INHALE 2 PUFFS EVERY 6 HOURS AS NEEDED FOR WHEEZING, Disp: 8.5 g, Rfl: 3 .  traMADol (ULTRAM) 50 MG tablet, Take 1 tablet (50 mg total) by mouth 2 (two) times daily as needed. (Patient taking differently: Take 50 mg by mouth 2 (two) times daily as needed for moderate pain. ), Disp: 30 tablet, Rfl: 0 .  VIAGRA 100 MG tablet, TAKE ONE (1) TABLET BY MOUTH DAILY AS NEEDED FOR ERECTILE DYSFUNCTION., Disp: 10 tablet, Rfl: 0  Allergies  Allergen Reactions  . Prednisone Rash    Social History  Substance Use Topics  . Smoking status: Never Smoker  . Smokeless tobacco: Never Used  . Alcohol use No    Family History  Problem Relation Age of Onset  . Cancer Father     lung and brain (smoker)  . Stroke Paternal Grandmother 79    stroke or heart attack (unsure)  . Cancer Mother     lung (smoker)  . Cancer Maternal Grandmother     leukemia  . CAD Neg Hx     unsure (see above)  . Diabetes Neg Hx      Review of Systems  Constitutional: Negative.   HENT: Negative.   Eyes: Negative.   Respiratory: Positive for shortness of breath. Negative for cough, hemoptysis, sputum production and wheezing.        SOB with exertion  Cardiovascular: Negative.   Gastrointestinal: Negative.   Genitourinary: Negative.   Musculoskeletal: Positive for joint pain and myalgias. Negative for back pain, falls and neck pain.  Skin: Negative.   Neurological: Negative.   Endo/Heme/Allergies: Negative.   Psychiatric/Behavioral: Negative.     Objective:  Physical Exam  Constitutional: He is oriented to person, place, and time. He appears well-developed. No distress.  Obese   HENT:  Head: Normocephalic and atraumatic.  Right Ear: External ear normal.  Left Ear: External ear normal.  Nose: Nose normal.  Mouth/Throat: Oropharynx is clear and moist.  Eyes: Conjunctivae and EOM are normal.  Neck: Normal range of motion. Neck supple.  Cardiovascular: Normal rate, regular rhythm, normal heart sounds and intact distal pulses.   No murmur heard. Respiratory: Effort normal and breath sounds normal. No respiratory distress. He has no wheezes.  GI: Soft. Bowel sounds are normal. He exhibits no distension. There is no tenderness.  Musculoskeletal:  His right hip can be flexed to about 100, no internal rotation, about 10 of external rotation, 10 of abduction. Left hip, flexed 120, rotated in 20, out 30, abduct 30.  Neurological: He is alert and oriented to person, place, and time. He has  normal strength. No sensory deficit.  Skin: No rash noted. He is not diaphoretic. No erythema.  Psychiatric: He has a normal mood and affect. His behavior is normal.    Vitals Weight: 205 lb Height: 69in Body Surface Area: 2.09 m Body Mass Index: 30.27 kg/m  Pulse: 72 (Regular)  BP: 142/82 (Sitting, Left Arm, Standard)  Imaging Review Plain radiographs demonstrate severe degenerative joint disease of the right hip(s). The bone quality appears to be good for age and reported activity level.  Assessment/Plan:  End stage primary osteoarthritis, right hip(s)  The patient history, physical examination, clinical judgement of the provider and imaging studies are consistent with end stage degenerative joint disease of the right hip(s) and total hip arthroplasty is deemed medically necessary. The treatment options including medical management, injection therapy, arthroscopy and arthroplasty were discussed at length. The risks and benefits of total hip arthroplasty were presented and reviewed. The risks due to aseptic loosening, infection, stiffness, dislocation/subluxation,   thromboembolic complications and other imponderables were discussed.  The patient acknowledged the explanation, agreed to proceed with the plan and consent was signed. Patient is being admitted for inpatient treatment for surgery, pain control, PT, OT, prophylactic antibiotics, VTE prophylaxis, progressive ambulation and ADL's and discharge planning.The patient is planning to be discharged home    PCP: Dr. Danise Mina Therapy Plans: [place] with wife DME needs: None. Has walker, cane, and 3-n-1    [employer], PA-C

## 2016-08-13 ENCOUNTER — Encounter (HOSPITAL_COMMUNITY): Payer: Self-pay | Admitting: *Deleted

## 2016-08-13 ENCOUNTER — Inpatient Hospital Stay (HOSPITAL_COMMUNITY): Payer: Medicare Other

## 2016-08-13 ENCOUNTER — Inpatient Hospital Stay (HOSPITAL_COMMUNITY): Payer: Medicare Other | Admitting: Anesthesiology

## 2016-08-13 ENCOUNTER — Inpatient Hospital Stay (HOSPITAL_COMMUNITY)
Admission: RE | Admit: 2016-08-13 | Discharge: 2016-08-15 | DRG: 470 | Disposition: A | Discharge status: Home Health | Payer: Medicare Other | Source: Ambulatory Visit | Attending: Orthopedic Surgery | Admitting: Orthopedic Surgery

## 2016-08-13 ENCOUNTER — Encounter (HOSPITAL_COMMUNITY)
Admission: RE | Disposition: A | Discharge status: Home Health | Payer: Self-pay | Source: Ambulatory Visit | Attending: Orthopedic Surgery

## 2016-08-13 DIAGNOSIS — Z6832 Body mass index (BMI) 32.0-32.9, adult: Secondary | ICD-10-CM | POA: Diagnosis not present

## 2016-08-13 DIAGNOSIS — M1611 Unilateral primary osteoarthritis, right hip: Principal | ICD-10-CM | POA: Diagnosis present

## 2016-08-13 DIAGNOSIS — E7800 Pure hypercholesterolemia, unspecified: Secondary | ICD-10-CM | POA: Diagnosis present

## 2016-08-13 DIAGNOSIS — M109 Gout, unspecified: Secondary | ICD-10-CM | POA: Diagnosis present

## 2016-08-13 DIAGNOSIS — Z96692 Finger-joint replacement of left hand: Secondary | ICD-10-CM | POA: Diagnosis present

## 2016-08-13 DIAGNOSIS — Z7951 Long term (current) use of inhaled steroids: Secondary | ICD-10-CM | POA: Diagnosis not present

## 2016-08-13 DIAGNOSIS — Z96641 Presence of right artificial hip joint: Secondary | ICD-10-CM | POA: Diagnosis not present

## 2016-08-13 DIAGNOSIS — Z96642 Presence of left artificial hip joint: Secondary | ICD-10-CM | POA: Diagnosis present

## 2016-08-13 DIAGNOSIS — Z96649 Presence of unspecified artificial hip joint: Secondary | ICD-10-CM

## 2016-08-13 DIAGNOSIS — K219 Gastro-esophageal reflux disease without esophagitis: Secondary | ICD-10-CM | POA: Diagnosis present

## 2016-08-13 DIAGNOSIS — M25551 Pain in right hip: Secondary | ICD-10-CM | POA: Diagnosis not present

## 2016-08-13 DIAGNOSIS — M169 Osteoarthritis of hip, unspecified: Secondary | ICD-10-CM | POA: Diagnosis present

## 2016-08-13 DIAGNOSIS — Z888 Allergy status to other drugs, medicaments and biological substances status: Secondary | ICD-10-CM

## 2016-08-13 DIAGNOSIS — E669 Obesity, unspecified: Secondary | ICD-10-CM | POA: Diagnosis present

## 2016-08-13 HISTORY — PX: TOTAL HIP ARTHROPLASTY: SHX124

## 2016-08-13 LAB — TYPE AND SCREEN
ABO/RH(D): O NEG
Antibody Screen: NEGATIVE

## 2016-08-13 SURGERY — ARTHROPLASTY, HIP, TOTAL, ANTERIOR APPROACH
Anesthesia: Monitor Anesthesia Care | Site: Hip | Laterality: Right

## 2016-08-13 SURGERY — ARTHROPLASTY, HIP, TOTAL, ANTERIOR APPROACH
Anesthesia: Choice | Laterality: Right

## 2016-08-13 MED ORDER — MEPERIDINE HCL 50 MG/ML IJ SOLN
6.2500 mg | INTRAMUSCULAR | Status: DC | PRN
Start: 2016-08-13 — End: 2016-08-13

## 2016-08-13 MED ORDER — COLCHICINE 0.6 MG PO TABS
0.6000 mg | ORAL_TABLET | Freq: Three times a day (TID) | ORAL | Status: DC | PRN
  Filled 2016-08-13: qty 1

## 2016-08-13 MED ORDER — CHLORHEXIDINE GLUCONATE 4 % EX LIQD: 60.0000 mL | Freq: Once | CUTANEOUS | Status: DC

## 2016-08-13 MED ORDER — EPHEDRINE 5 MG/ML INJ
INTRAVENOUS | Status: AC
  Filled 2016-08-13: qty 10

## 2016-08-13 MED ORDER — ALBUTEROL SULFATE (2.5 MG/3ML) 0.083% IN NEBU: 3.0000 mL | INHALATION_SOLUTION | Freq: Four times a day (QID) | RESPIRATORY_TRACT | Status: DC | PRN

## 2016-08-13 MED ORDER — CEFAZOLIN SODIUM-DEXTROSE 2-4 GM/100ML-% IV SOLN
2.0000 g | Freq: Four times a day (QID) | INTRAVENOUS | Status: AC
  Administered 2016-08-13 – 2016-08-14 (×2): 2 g via INTRAVENOUS
  Filled 2016-08-13 (×2): qty 100

## 2016-08-13 MED ORDER — ONDANSETRON HCL 4 MG/2ML IJ SOLN: 4.0000 mg | Freq: Four times a day (QID) | INTRAMUSCULAR | Status: DC | PRN

## 2016-08-13 MED ORDER — EPHEDRINE SULFATE 50 MG/ML IJ SOLN
INTRAMUSCULAR | Status: DC | PRN
  Administered 2016-08-13 (×2): 10 mg via INTRAVENOUS

## 2016-08-13 MED ORDER — PHENOL 1.4 % MT LIQD: 1.0000 | OROMUCOSAL | Status: DC | PRN

## 2016-08-13 MED ORDER — MIDAZOLAM HCL 2 MG/2ML IJ SOLN
INTRAMUSCULAR | Status: DC | PRN
  Administered 2016-08-13 (×2): 1 mg via INTRAVENOUS

## 2016-08-13 MED ORDER — TRANEXAMIC ACID 1000 MG/10ML IV SOLN
1000.0000 mg | Freq: Once | INTRAVENOUS | Status: AC
  Administered 2016-08-13: 20:00:00 1000 mg via INTRAVENOUS
  Filled 2016-08-13: qty 1100

## 2016-08-13 MED ORDER — SODIUM CHLORIDE 0.9 % IR SOLN
Status: DC | PRN
  Administered 2016-08-13: 1000 mL

## 2016-08-13 MED ORDER — MENTHOL 3 MG MT LOZG: 1.0000 | LOZENGE | OROMUCOSAL | Status: DC | PRN

## 2016-08-13 MED ORDER — POLYETHYLENE GLYCOL 3350 17 G PO PACK: 17.0000 g | PACK | Freq: Every day | ORAL | Status: DC | PRN

## 2016-08-13 MED ORDER — HYDROMORPHONE HCL 1 MG/ML IJ SOLN
0.2500 mg | INTRAMUSCULAR | Status: DC | PRN
  Administered 2016-08-13 (×3): 0.25 mg via INTRAVENOUS

## 2016-08-13 MED ORDER — ACETAMINOPHEN 325 MG PO TABS
650.0000 mg | ORAL_TABLET | Freq: Four times a day (QID) | ORAL | Status: DC | PRN
  Administered 2016-08-14: 650 mg via ORAL
  Filled 2016-08-13: qty 2

## 2016-08-13 MED ORDER — BISACODYL 10 MG RE SUPP: 10.0000 mg | Freq: Every day | RECTAL | Status: DC | PRN

## 2016-08-13 MED ORDER — TRAMADOL HCL 50 MG PO TABS: 50.0000 mg | ORAL_TABLET | Freq: Four times a day (QID) | ORAL | Status: DC | PRN

## 2016-08-13 MED ORDER — DOCUSATE SODIUM 100 MG PO CAPS
100.0000 mg | ORAL_CAPSULE | Freq: Two times a day (BID) | ORAL | Status: DC
  Administered 2016-08-13 – 2016-08-15 (×4): 100 mg via ORAL
  Filled 2016-08-13 (×4): qty 1

## 2016-08-13 MED ORDER — PROPOFOL 500 MG/50ML IV EMUL
INTRAVENOUS | Status: DC | PRN
  Administered 2016-08-13: 65 ug/kg/min via INTRAVENOUS

## 2016-08-13 MED ORDER — OXYCODONE HCL 5 MG PO TABS
5.0000 mg | ORAL_TABLET | ORAL | Status: DC | PRN
  Administered 2016-08-13: 19:00:00 5 mg via ORAL
  Administered 2016-08-13 – 2016-08-15 (×8): 10 mg via ORAL
  Filled 2016-08-13 (×8): qty 2
  Filled 2016-08-13: qty 1

## 2016-08-13 MED ORDER — FLEET ENEMA 7-19 GM/118ML RE ENEM: 1.0000 | ENEMA | Freq: Once | RECTAL | Status: DC | PRN

## 2016-08-13 MED ORDER — BUPIVACAINE HCL (PF) 0.25 % IJ SOLN
INTRAMUSCULAR | Status: AC
  Filled 2016-08-13: qty 30

## 2016-08-13 MED ORDER — LIDOCAINE 2% (20 MG/ML) 5 ML SYRINGE
INTRAMUSCULAR | Status: DC | PRN
  Administered 2016-08-13: 60 mg via INTRAVENOUS

## 2016-08-13 MED ORDER — HYDROMORPHONE HCL 1 MG/ML IJ SOLN
INTRAMUSCULAR | Status: AC
  Filled 2016-08-13: qty 1

## 2016-08-13 MED ORDER — ONDANSETRON HCL 4 MG/2ML IJ SOLN: 4.0000 mg | Freq: Once | INTRAMUSCULAR | Status: DC | PRN

## 2016-08-13 MED ORDER — MIDAZOLAM HCL 2 MG/2ML IJ SOLN
INTRAMUSCULAR | Status: AC
  Filled 2016-08-13: qty 2

## 2016-08-13 MED ORDER — METHOCARBAMOL 500 MG PO TABS
500.0000 mg | ORAL_TABLET | Freq: Four times a day (QID) | ORAL | Status: DC | PRN
  Administered 2016-08-14 – 2016-08-15 (×2): 500 mg via ORAL
  Filled 2016-08-13 (×2): qty 1

## 2016-08-13 MED ORDER — SODIUM CHLORIDE 0.9 % IV SOLN
1000.0000 mg | INTRAVENOUS | Status: AC
  Administered 2016-08-13: 1000 mg via INTRAVENOUS
  Filled 2016-08-13: qty 1100

## 2016-08-13 MED ORDER — CEFAZOLIN SODIUM-DEXTROSE 2-4 GM/100ML-% IV SOLN
INTRAVENOUS | Status: AC
  Filled 2016-08-13: qty 100

## 2016-08-13 MED ORDER — LIDOCAINE 2% (20 MG/ML) 5 ML SYRINGE
INTRAMUSCULAR | Status: AC
  Filled 2016-08-13: qty 5

## 2016-08-13 MED ORDER — ACETAMINOPHEN 500 MG PO TABS
1000.0000 mg | ORAL_TABLET | Freq: Four times a day (QID) | ORAL | Status: AC
  Administered 2016-08-13 – 2016-08-14 (×4): 1000 mg via ORAL
  Filled 2016-08-13 (×4): qty 2

## 2016-08-13 MED ORDER — MORPHINE SULFATE (PF) 4 MG/ML IV SOLN: 1.0000 mg | INTRAVENOUS | Status: DC | PRN

## 2016-08-13 MED ORDER — ACETAMINOPHEN 650 MG RE SUPP: 650.0000 mg | Freq: Four times a day (QID) | RECTAL | Status: DC | PRN

## 2016-08-13 MED ORDER — DIPHENHYDRAMINE HCL 12.5 MG/5ML PO ELIX: 12.5000 mg | ORAL_SOLUTION | ORAL | Status: DC | PRN

## 2016-08-13 MED ORDER — DEXAMETHASONE SODIUM PHOSPHATE 10 MG/ML IJ SOLN
INTRAMUSCULAR | Status: AC
  Filled 2016-08-13: qty 1

## 2016-08-13 MED ORDER — ACETAMINOPHEN 10 MG/ML IV SOLN
INTRAVENOUS | Status: AC
Start: 2016-08-13 — End: 2016-08-14
  Filled 2016-08-13: qty 100

## 2016-08-13 MED ORDER — PANTOPRAZOLE SODIUM 40 MG PO TBEC: 40.0000 mg | DELAYED_RELEASE_TABLET | Freq: Every day | ORAL | Status: DC | PRN

## 2016-08-13 MED ORDER — ONDANSETRON HCL 4 MG/2ML IJ SOLN
INTRAMUSCULAR | Status: DC | PRN
  Administered 2016-08-13: 4 mg via INTRAVENOUS

## 2016-08-13 MED ORDER — BUPIVACAINE HCL (PF) 0.25 % IJ SOLN
INTRAMUSCULAR | Status: DC | PRN
  Administered 2016-08-13: 30 mL

## 2016-08-13 MED ORDER — ACETAMINOPHEN 10 MG/ML IV SOLN
1000.0000 mg | Freq: Once | INTRAVENOUS | Status: AC
  Administered 2016-08-13: 1000 mg via INTRAVENOUS

## 2016-08-13 MED ORDER — LACTATED RINGERS IV SOLN
INTRAVENOUS | Status: DC
  Administered 2016-08-13 (×2): via INTRAVENOUS

## 2016-08-13 MED ORDER — METHOCARBAMOL 1000 MG/10ML IJ SOLN
500.0000 mg | Freq: Four times a day (QID) | INTRAVENOUS | Status: DC | PRN
  Administered 2016-08-13: 500 mg via INTRAVENOUS
  Filled 2016-08-13: qty 5
  Filled 2016-08-13: qty 550

## 2016-08-13 MED ORDER — PROPOFOL 10 MG/ML IV BOLUS
INTRAVENOUS | Status: DC | PRN
  Administered 2016-08-13: 20 mg via INTRAVENOUS

## 2016-08-13 MED ORDER — PHENYLEPHRINE 40 MCG/ML (10ML) SYRINGE FOR IV PUSH (FOR BLOOD PRESSURE SUPPORT)
PREFILLED_SYRINGE | INTRAVENOUS | Status: AC
  Filled 2016-08-13: qty 10

## 2016-08-13 MED ORDER — PROPOFOL 10 MG/ML IV BOLUS
INTRAVENOUS | Status: AC
  Filled 2016-08-13: qty 60

## 2016-08-13 MED ORDER — FLUTICASONE PROPIONATE 50 MCG/ACT NA SUSP
2.0000 | Freq: Every day | NASAL | Status: DC | PRN
  Filled 2016-08-13: qty 16

## 2016-08-13 MED ORDER — PHENYLEPHRINE HCL 10 MG/ML IJ SOLN
INTRAMUSCULAR | Status: DC | PRN
  Administered 2016-08-13 (×5): 80 ug via INTRAVENOUS

## 2016-08-13 MED ORDER — RIVAROXABAN 10 MG PO TABS
10.0000 mg | ORAL_TABLET | Freq: Every day | ORAL | Status: DC
  Administered 2016-08-14 – 2016-08-15 (×2): 10 mg via ORAL
  Filled 2016-08-13 (×2): qty 1

## 2016-08-13 MED ORDER — METOCLOPRAMIDE HCL 5 MG/ML IJ SOLN: 5.0000 mg | Freq: Three times a day (TID) | INTRAMUSCULAR | Status: DC | PRN

## 2016-08-13 MED ORDER — METOCLOPRAMIDE HCL 5 MG PO TABS: 5.0000 mg | ORAL_TABLET | Freq: Three times a day (TID) | ORAL | Status: DC | PRN

## 2016-08-13 MED ORDER — SODIUM CHLORIDE 0.9 % IV SOLN
INTRAVENOUS | Status: DC
  Administered 2016-08-13: 1000 mL via INTRAVENOUS
  Administered 2016-08-14: 02:00:00 via INTRAVENOUS

## 2016-08-13 MED ORDER — CEFAZOLIN SODIUM-DEXTROSE 2-4 GM/100ML-% IV SOLN
2.0000 g | INTRAVENOUS | Status: AC
  Administered 2016-08-13: 2 g via INTRAVENOUS

## 2016-08-13 MED ORDER — ONDANSETRON HCL 4 MG PO TABS: 4.0000 mg | ORAL_TABLET | Freq: Four times a day (QID) | ORAL | Status: DC | PRN

## 2016-08-13 MED ORDER — ONDANSETRON HCL 4 MG/2ML IJ SOLN
INTRAMUSCULAR | Status: AC
  Filled 2016-08-13: qty 2

## 2016-08-13 SURGICAL SUPPLY — 34 items
BAG DECANTER FOR FLEXI CONT (MISCELLANEOUS) ×3 IMPLANT
BAG ZIPLOCK 12X15 (MISCELLANEOUS) IMPLANT
BLADE SAG 18X100X1.27 (BLADE) ×3 IMPLANT
CAPT HIP TOTAL 2 ×3 IMPLANT
CLOSURE WOUND 1/2 X4 (GAUZE/BANDAGES/DRESSINGS) ×1
CLOTH BEACON ORANGE TIMEOUT ST (SAFETY) ×3 IMPLANT
COVER PERINEAL POST (MISCELLANEOUS) ×3 IMPLANT
DECANTER SPIKE VIAL GLASS SM (MISCELLANEOUS) ×3 IMPLANT
DRAPE STERI IOBAN 125X83 (DRAPES) ×3 IMPLANT
DRAPE U-SHAPE 47X51 STRL (DRAPES) ×6 IMPLANT
DRSG ADAPTIC 3X8 NADH LF (GAUZE/BANDAGES/DRESSINGS) ×3 IMPLANT
DRSG MEPILEX BORDER 4X4 (GAUZE/BANDAGES/DRESSINGS) ×3 IMPLANT
DRSG MEPILEX BORDER 4X8 (GAUZE/BANDAGES/DRESSINGS) ×3 IMPLANT
DURAPREP 26ML APPLICATOR (WOUND CARE) ×3 IMPLANT
ELECT REM PT RETURN 9FT ADLT (ELECTROSURGICAL) ×3
ELECTRODE REM PT RTRN 9FT ADLT (ELECTROSURGICAL) ×1 IMPLANT
EVACUATOR 1/8 PVC DRAIN (DRAIN) ×3 IMPLANT
GLOVE BIO SURGEON STRL SZ7.5 (GLOVE) ×3 IMPLANT
GLOVE BIO SURGEON STRL SZ8 (GLOVE) ×6 IMPLANT
GLOVE BIOGEL PI IND STRL 8 (GLOVE) ×2 IMPLANT
GLOVE BIOGEL PI INDICATOR 8 (GLOVE) ×4
GOWN STRL REUS W/TWL LRG LVL3 (GOWN DISPOSABLE) ×3 IMPLANT
GOWN STRL REUS W/TWL XL LVL3 (GOWN DISPOSABLE) ×3 IMPLANT
PACK ANTERIOR HIP CUSTOM (KITS) ×3 IMPLANT
STRIP CLOSURE SKIN 1/2X4 (GAUZE/BANDAGES/DRESSINGS) ×2 IMPLANT
SUT ETHIBOND NAB CT1 #1 30IN (SUTURE) ×3 IMPLANT
SUT MNCRL AB 4-0 PS2 18 (SUTURE) ×3 IMPLANT
SUT STRATAFIX 0 PDS 27 VIOLET (SUTURE) ×3
SUT VIC AB 2-0 CT1 27 (SUTURE) ×6
SUT VIC AB 2-0 CT1 TAPERPNT 27 (SUTURE) ×3 IMPLANT
SUTURE STRATFX 0 PDS 27 VIOLET (SUTURE) ×1 IMPLANT
SYR 50ML LL SCALE MARK (SYRINGE) IMPLANT
TRAY FOLEY W/METER SILVER 16FR (SET/KITS/TRAYS/PACK) ×3 IMPLANT
YANKAUER SUCT BULB TIP 10FT TU (MISCELLANEOUS) ×3 IMPLANT

## 2016-08-13 NOTE — Anesthesia Procedure Notes (Signed)
Procedure Name: MAC Date/Time: 08/13/2016 1:32 PM Performed by: Dione Booze Pre-anesthesia Checklist: Patient identified, Emergency Drugs available, Suction available and Patient being monitored Oxygen Delivery Method: Simple face mask Placement Confirmation: positive ETCO2

## 2016-08-13 NOTE — Interval H&P Note (Signed)
History and Physical Interval Note:  08/13/2016 12:23 PM  [name]  has presented today for surgery, with the diagnosis of Osteoarthritis Right Hip  The various methods of treatment have been discussed with the patient and family. After consideration of risks, benefits and other options for treatment, the patient has consented to  Procedure(s): RIGHT TOTAL HIP ARTHROPLASTY ANTERIOR APPROACH (Right) as a surgical intervention .  The patient's history has been reviewed, patient examined, no change in status, stable for surgery.  I have reviewed the patient's chart and labs.  Questions were answered to the patient's satisfaction.     Gearlean Alf

## 2016-08-13 NOTE — Anesthesia Postprocedure Evaluation (Signed)
Anesthesia Post Note  Patient: [name]  Procedure(s) Performed: Procedure(s) (LRB): RIGHT TOTAL HIP ARTHROPLASTY ANTERIOR APPROACH (Right)  Patient location during evaluation: PACU Anesthesia Type: MAC Level of consciousness: oriented and awake and alert Pain management: pain level controlled Vital Signs Assessment: post-procedure vital signs reviewed and stable Respiratory status: spontaneous breathing, respiratory function stable and patient connected to nasal cannula oxygen Cardiovascular status: blood pressure returned to baseline and stable Postop Assessment: no headache and no backache Anesthetic complications: no       Last Vitals:  Vitals:   08/13/16 1723 08/13/16 1825  BP: 126/66 122/68  Pulse: 62 67  Resp: 14 16  Temp: 36.4 C 36.4 C    Last Pain:  Vitals:   08/13/16 1825  TempSrc: Oral  PainSc:                  [name] DAVID

## 2016-08-13 NOTE — Transfer of Care (Signed)
Immediate Anesthesia Transfer of Care Note  Patient: [name]  Procedure(s) Performed: Procedure(s): RIGHT TOTAL HIP ARTHROPLASTY ANTERIOR APPROACH (Right)  Patient Location: PACU  Anesthesia Type:MAC and Spinal  Level of Consciousness: awake, alert , oriented and patient cooperative  Airway & Oxygen Therapy: Patient Spontanous Breathing and Patient connected to face mask oxygen  Post-op Assessment: Report given to RN and Post -op Vital signs reviewed and stable  Post vital signs: Reviewed and stable  Last Vitals:  Vitals:   08/13/16 1219 08/13/16 1523  BP: (!) 159/79   Pulse: 65   Resp: 18   Temp: 36.4 C (P) 36.6 C    Last Pain:  Vitals:   08/13/16 1240  TempSrc:   PainSc: 5       Patients Stated Pain Goal: 5 (39/03/00 9233)  Complications: No apparent anesthesia complications

## 2016-08-13 NOTE — Anesthesia Preprocedure Evaluation (Signed)
Anesthesia Evaluation  Patient identified by MRN, date of birth, ID band Patient awake    Reviewed: Allergy & Precautions, NPO status , Patient's Chart, lab work & pertinent test results  Airway Mallampati: I  TM Distance: >3 FB Neck ROM: Full    Dental   Pulmonary    Pulmonary exam normal        Cardiovascular Normal cardiovascular exam     Neuro/Psych    GI/Hepatic GERD  Medicated and Controlled,  Endo/Other    Renal/GU      Musculoskeletal   Abdominal   Peds  Hematology   Anesthesia Other Findings   Reproductive/Obstetrics                             Anesthesia Physical Anesthesia Plan  ASA: II  Anesthesia Plan: Spinal and MAC   Post-op Pain Management:    Induction: Intravenous  Airway Management Planned: Simple Face Mask  Additional Equipment:   Intra-op Plan:   Post-operative Plan:   Informed Consent: I have reviewed the patients History and Physical, chart, labs and discussed the procedure including the risks, benefits and alternatives for the proposed anesthesia with the patient or authorized representative who has indicated his/her understanding and acceptance.     Plan Discussed with: CRNA and Surgeon  Anesthesia Plan Comments:         Anesthesia Quick Evaluation

## 2016-08-13 NOTE — Op Note (Signed)
OPERATIVE REPORT- TOTAL HIP ARTHROPLASTY   PREOPERATIVE DIAGNOSIS: Osteoarthritis of the Right hip.   POSTOPERATIVE DIAGNOSIS: Osteoarthritis of the Right  hip.   PROCEDURE: Right total hip arthroplasty, anterior approach.   SURGEON: Gaynelle Arabian, MD   ASSISTANT: Arlee Muslim, PA-C  ANESTHESIA:  Spinal  ESTIMATED BLOOD LOSS:- 450 ml   DRAINS: Hemovac x1.   COMPLICATIONS: None   CONDITION: PACU - hemodynamically stable.   BRIEF CLINICAL NOTE: [name] is a 71 y.o. male who has advanced end-  stage arthritis of their Right  hip with progressively worsening pain and  dysfunction.The patient has failed nonoperative management and presents for  total hip arthroplasty.   PROCEDURE IN DETAIL: After successful administration of spinal  anesthetic, the traction boots for the [facility] bed were placed on both  feet and the patient was placed onto the [facility] bed, boots placed into the leg  holders. The Right hip was then isolated from the perineum with plastic  drapes and prepped and draped in the usual sterile fashion. ASIS and  greater trochanter were marked and a oblique incision was made, starting  at about 1 cm lateral and 2 cm distal to the ASIS and coursing towards  the anterior cortex of the femur. The skin was cut with a 10 blade  through subcutaneous tissue to the level of the fascia overlying the  tensor fascia lata muscle. The fascia was then incised in line with the  incision at the junction of the anterior third and posterior 2/3rd. The  muscle was teased off the fascia and then the interval between the TFL  and the rectus was developed. The Hohmann retractor was then placed at  the top of the femoral neck over the capsule. The vessels overlying the  capsule were cauterized and the fat on top of the capsule was removed.  A Hohmann retractor was then placed anterior underneath the rectus  femoris to give exposure to the entire anterior capsule. A T-shaped   capsulotomy was performed. The edges were tagged and the femoral head  was identified.       Osteophytes are removed off the superior acetabulum.  The femoral neck was then cut in situ with an oscillating saw. Traction  was then applied to the left lower extremity utilizing the [facility]  traction. The femoral head was then removed. Retractors were placed  around the acetabulum and then circumferential removal of the labrum was  performed. Osteophytes were also removed. Reaming starts at 47 mm to  medialize and  Increased in 2 mm increments to 53 mm. We reamed in  approximately 40 degrees of abduction, 20 degrees anteversion. A 54 mm  pinnacle acetabular shell was then impacted in anatomic position under  fluoroscopic guidance with excellent purchase. We did not need to place  any additional dome screws. A 36 mm neutral + 4 marathon liner was then  placed into the acetabular shell.       The femoral lift was then placed along the lateral aspect of the femur  just distal to the vastus ridge. The leg was  externally rotated and capsule  was stripped off the inferior aspect of the femoral neck down to the  level of the lesser trochanter, this was done with electrocautery. The femur was lifted after this was performed. The  leg was then placed in an extended and adducted position essentially delivering the femur. We also removed the capsule superiorly and the piriformis from the piriformis  fossa to gain excellent exposure of the  proximal femur. Rongeur was used to remove some cancellous bone to get  into the lateral portion of the proximal femur for placement of the  initial starter reamer. The starter broaches was placed  the starter broach  and was shown to go down the center of the canal. Broaching  with the  Corail system was then performed starting at size 8, coursing  Up to size 12. A size 12 had excellent torsional and rotational  and axial stability. The trial standard offset neck was then  placed  with a 36 + 8.5 trial head. The hip was then reduced. We confirmed that  the stem was in the canal both on AP and lateral x-rays. It also has excellent sizing. The hip was reduced with outstanding stability through full extension and full external rotation.. AP pelvis was taken and the leg lengths were measured and found to be equal. Hip was then dislocated again and the femoral head and neck removed. The  femoral broach was removed. Size 12 Corail stem with a standard offset  neck was then impacted into the femur following native anteversion. Has  excellent purchase in the canal. Excellent torsional and rotational and  axial stability. It is confirmed to be in the canal on AP and lateral  fluoroscopic views. The 36 + 8.5 ceramic head was placed and the hip  reduced with outstanding stability. Again AP pelvis was taken and it  confirmed that the leg lengths were equal. The wound was then copiously  irrigated with saline solution and the capsule reattached and repaired  with Ethibond suture. 30 ml of .25% Bupivicaine was  injected into the capsule and into the edge of the tensor fascia lata as well as subcutaneous tissue. The fascia overlying the tensor fascia lata was then closed with a running #1 V-Loc. Subcu was closed with interrupted 2-0 Vicryl and subcuticular running 4-0 Monocryl. Incision was cleaned  and dried. Steri-Strips and a bulky sterile dressing applied. Hemovac  drain was hooked to suction and then the patient was awakened and transported to  recovery in stable condition.        Please note that a surgical assistant was a medical necessity for this procedure to perform it in a safe and expeditious manner. Assistant was necessary to provide appropriate retraction of vital neurovascular structures and to prevent femoral fracture and allow for anatomic placement of the prosthesis.  Gaynelle Arabian, M.D.

## 2016-08-14 ENCOUNTER — Encounter (HOSPITAL_COMMUNITY): Payer: Self-pay | Admitting: Orthopedic Surgery

## 2016-08-14 LAB — CBC
HCT: 36 % — ABNORMAL LOW (ref 39.0–52.0)
Hemoglobin: 12.7 g/dL — ABNORMAL LOW (ref 13.0–17.0)
MCH: 30.5 pg (ref 26.0–34.0)
MCHC: 35.3 g/dL (ref 30.0–36.0)
MCV: 86.5 fL (ref 78.0–100.0)
Platelets: 256 10*3/uL (ref 150–400)
RBC: 4.16 MIL/uL — ABNORMAL LOW (ref 4.22–5.81)
RDW: 12.8 % (ref 11.5–15.5)
WBC: 9 10*3/uL (ref 4.0–10.5)

## 2016-08-14 LAB — BASIC METABOLIC PANEL
ANION GAP: 7 (ref 5–15)
BUN: 11 mg/dL (ref 6–20)
CALCIUM: 8.5 mg/dL — AB (ref 8.9–10.3)
CO2: 26 mmol/L (ref 22–32)
CREATININE: 0.9 mg/dL (ref 0.61–1.24)
Chloride: 105 mmol/L (ref 101–111)
GFR calc Af Amer: >60 mL/min (ref >60)
GFR calc non Af Amer: >60 mL/min (ref >60)
GLUCOSE: 170 mg/dL — AB (ref 65–99)
Potassium: 3.9 mmol/L (ref 3.5–5.1)
Sodium: 138 mmol/L (ref 135–145)

## 2016-08-14 MED ORDER — RIVAROXABAN 10 MG PO TABS: 10.0000 mg | ORAL_TABLET | Freq: Every day | ORAL | 0 refills | Status: DC

## 2016-08-14 MED ORDER — METHOCARBAMOL 500 MG PO TABS: 500.0000 mg | ORAL_TABLET | Freq: Four times a day (QID) | ORAL | 0 refills | Status: DC | PRN

## 2016-08-14 MED ORDER — TRAMADOL HCL 50 MG PO TABS: 50.0000 mg | ORAL_TABLET | Freq: Four times a day (QID) | ORAL | 0 refills | Status: DC | PRN

## 2016-08-14 MED ORDER — OXYCODONE HCL 5 MG PO TABS: 5.0000 mg | ORAL_TABLET | ORAL | 0 refills | Status: DC | PRN

## 2016-08-14 NOTE — Discharge Summary (Signed)
Physician Discharge Summary   Patient ID: [name] MRN: [id number] DOB/AGE: August 26, 1945 71 y.o.  Admit date: 08/13/2016 Discharge date: 08-15-2016  Primary Diagnosis:  Osteoarthritis of the Right hip.   Admission Diagnoses:  Past Medical History:  Diagnosis Date  . AR (allergic rhinitis)   . Arthritis    PAIN AND OA LEFT KNEE ;  S/P RIGHT TOTAL KNEE REPLACEMENT  . Complication of anesthesia    STATES SEVERE PAIN WAKING UP AFTER GENERAL ANESTHESIA; AND FEELS BAD.  . ED (erectile dysfunction)   . GERD (gastroesophageal reflux disease)   . Gout    per pt  . History of kidney stones   . History of nephrolithiasis    seen Dr. [facility], last 02/2011  . Hypercholesterolemia   . RBBB (right bundle branch block)    Discharge Diagnoses:   Principal Problem:   OA (osteoarthritis) of hip  Estimated body mass index is 32.26 kg/m as calculated from the following:   Height as of this encounter: _0  (1.702 m).   Weight as of this encounter: 93.4 kg (206 lb).  Procedure(s) (LRB): RIGHT TOTAL HIP ARTHROPLASTY ANTERIOR APPROACH (Right)   Consults: None  HPI: [name] is a 71 y.o. male who has advanced end-  stage arthritis of their Right  hip with progressively worsening pain and  dysfunction.The patient has failed nonoperative management and presents for  total hip arthroplasty.   Laboratory Data: Admission on 08/13/2016  Component Date Value Ref Range Status  . WBC 08/14/2016 9.0  4.0 - 10.5 K/uL Final  . RBC 08/14/2016 4.16* 4.22 - 5.81 MIL/uL Final  . Hemoglobin 08/14/2016 12.7* 13.0 - 17.0 g/dL Final  . HCT 08/14/2016 36.0* 39.0 - 52.0 % Final  . MCV 08/14/2016 86.5  78.0 - 100.0 fL Final  . MCH 08/14/2016 30.5  26.0 - 34.0 pg Final  . MCHC 08/14/2016 35.3  30.0 - 36.0 g/dL Final  . RDW 08/14/2016 12.8  11.5 - 15.5 % Final  . Platelets 08/14/2016 256  150 - 400 K/uL Final  . Sodium 08/14/2016 138  135 - 145 mmol/L Final  . Potassium 08/14/2016  3.9  3.5 - 5.1 mmol/L Final  . Chloride 08/14/2016 105  101 - 111 mmol/L Final  . CO2 08/14/2016 26  22 - 32 mmol/L Final  . Glucose, Bld 08/14/2016 170* 65 - 99 mg/dL Final  . BUN 08/14/2016 11  6 - 20 mg/dL Final  . Creatinine, Ser 08/14/2016 0.90  0.61 - 1.24 mg/dL Final  . Calcium 08/14/2016 8.5* 8.9 - 10.3 mg/dL Final  . GFR calc non Af Amer 08/14/2016 >60  >60 mL/min Final  . GFR calc Af Amer 08/14/2016 >60  >60 mL/min Final   Comment: (NOTE) The eGFR has been calculated using the CKD EPI equation. This calculation has not been validated in all clinical situations. eGFR's persistently <60 mL/min signify possible Chronic Kidney Disease.   Georgiann Hahn gap 08/14/2016 7  5 - 15 Final  Hospital Outpatient Visit on 08/05/2016  Component Date Value Ref Range Status  . aPTT 08/05/2016 29  24 - 36 seconds Final  . Prothrombin Time 08/05/2016 13.3  11.4 - 15.2 seconds Final  . INR 08/05/2016 1.01   Final  . ABO/RH(D) 08/05/2016 O NEG   Final  . Antibody Screen 08/05/2016 NEG   Final  . Sample Expiration 08/05/2016 08/16/2016   Final  . Extend sample reason 08/05/2016 NO TRANSFUSIONS OR PREGNANCY IN THE PAST 3 MONTHS  Final  . MRSA, PCR 08/05/2016 NEGATIVE  NEGATIVE Final  . Staphylococcus aureus 08/05/2016 NEGATIVE  NEGATIVE Final   Comment:        The Xpert SA Assay (FDA approved for NASAL specimens in patients over 43 years of age), is one component of a comprehensive surveillance program.  Test performance has been validated by [facility] for patients greater than or equal to 78 year old. It is not intended to diagnose infection nor to guide or monitor treatment.   Clinical Support on 07/16/2016  Component Date Value Ref Range Status  . Cholesterol 07/16/2016 178  0 - 200 mg/dL Final  . Triglycerides 07/16/2016 99.0  0.0 - 149.0 mg/dL Final  . HDL 07/16/2016 33.70* >39.00 mg/dL Final  . VLDL 07/16/2016 19.8  0.0 - 40.0 mg/dL Final  . LDL Cholesterol 07/16/2016 125* 0 - 99  mg/dL Final  . Total CHOL/HDL Ratio 07/16/2016 5   Final  . NonHDL 07/16/2016 144.41   Final  . Hgb A1c MFr Bld 07/16/2016 5.6  4.6 - 6.5 % Final  . WBC 07/16/2016 8.0  4.0 - 10.5 K/uL Final  . RBC 07/16/2016 4.88  4.22 - 5.81 Mil/uL Final  . Hemoglobin 07/16/2016 14.7  13.0 - 17.0 g/dL Final  . HCT 07/16/2016 42.8  39.0 - 52.0 % Final  . MCV 07/16/2016 87.7  78.0 - 100.0 fl Final  . MCHC 07/16/2016 34.3  30.0 - 36.0 g/dL Final  . RDW 07/16/2016 13.3  11.5 - 15.5 % Final  . Platelets 07/16/2016 314.0  150.0 - 400.0 K/uL Final  . Neutrophils Relative % 07/16/2016 57.1  43.0 - 77.0 % Final  . Lymphocytes Relative 07/16/2016 28.0  12.0 - 46.0 % Final  . Monocytes Relative 07/16/2016 7.0  3.0 - 12.0 % Final  . Eosinophils Relative 07/16/2016 7.3* 0.0 - 5.0 % Final  . Basophils Relative 07/16/2016 0.6  0.0 - 3.0 % Final  . Neutro Abs 07/16/2016 4.6  1.4 - 7.7 K/uL Final  . Lymphs Abs 07/16/2016 2.2  0.7 - 4.0 K/uL Final  . Monocytes Absolute 07/16/2016 0.6  0.1 - 1.0 K/uL Final  . Eosinophils Absolute 07/16/2016 0.6  0.0 - 0.7 K/uL Final  . Basophils Absolute 07/16/2016 0.0  0.0 - 0.1 K/uL Final  . Uric Acid, Serum 07/16/2016 7.2  4.0 - 7.8 mg/dL Final  . PSA 07/16/2016 0.80  0.10 - 4.00 ng/ml Final  . Sodium 07/16/2016 141  135 - 145 mEq/L Final  . Potassium 07/16/2016 4.2  3.5 - 5.1 mEq/L Final  . Chloride 07/16/2016 108  96 - 112 mEq/L Final  . CO2 07/16/2016 28  19 - 32 mEq/L Final  . Glucose, Bld 07/16/2016 89  70 - 99 mg/dL Final  . BUN 07/16/2016 20  6 - 23 mg/dL Final  . Creatinine, Ser 07/16/2016 0.96  0.40 - 1.50 mg/dL Final  . Total Bilirubin 07/16/2016 0.5  0.2 - 1.2 mg/dL Final  . Alkaline Phosphatase 07/16/2016 92  39 - 117 U/L Final  . AST 07/16/2016 15  0 - 37 U/L Final  . ALT 07/16/2016 15  0 - 53 U/L Final  . Total Protein 07/16/2016 7.1  6.0 - 8.3 g/dL Final  . Albumin 07/16/2016 4.6  3.5 - 5.2 g/dL Final  . Calcium 07/16/2016 9.7  8.4 - 10.5 mg/dL Final  . GFR  07/16/2016 81.88  >60.00 mL/min Final     X-Rays:Dg Chest 2 View  Result Date: 07/18/2016 CLINICAL DATA:  Pre-op  respiratory exam.  Asthma. EXAM: CHEST  2 VIEW COMPARISON:  01/02/2016 and 12/06/2012 FINDINGS: The heart size and mediastinal contours are within normal limits. Small calcified granulomas again seen in the left upper lobe. No evidence of pulmonary infiltrate or edema. No evidence of pleural effusion. IMPRESSION: Stable exam.  No active cardiopulmonary disease. Electronically Signed   By: Earle Gell M.D.   On: 07/18/2016 19:34   Dg Pelvis Portable  Result Date: 08/13/2016 CLINICAL DATA:  Status post right hip replacement EXAM: PORTABLE PELVIS 1-2 VIEWS COMPARISON:  None. FINDINGS: Changes of right hip replacement. Normal AP alignment. Soft tissue drain in place. No hardware or bony complicating feature. IMPRESSION: Right hip replacement.  No complicating feature. Electronically Signed   By: Rolm Baptise M.D.   On: 08/13/2016 15:47   Dg C-arm 1-60 Min-no Report  Result Date: 08/13/2016 Fluoroscopy was utilized by the requesting physician.  No radiographic interpretation.    EKG: Orders placed or performed in visit on 07/18/16  . EKG 12-Lead     Hospital Course: Patient was admitted to [facility] and taken to the OR and underwent the above state procedure without complications.  Patient tolerated the procedure well and was later transferred to the recovery room and then to the orthopaedic floor for postoperative care.  They were given PO and IV analgesics for pain control following their surgery.  They were given 24 hours of postoperative antibiotics of  Anti-infectives    Start     Dose/Rate Route Frequency Ordered Stop   08/14/16 0600  ceFAZolin (ANCEF) IVPB 2g/100 mL premix     2 g 200 mL/hr over 30 Minutes Intravenous On call to O.R. 08/13/16 1212 08/13/16 1336   08/13/16 1930  ceFAZolin (ANCEF) IVPB 2g/100 mL premix     2 g 200 mL/hr over 30 Minutes Intravenous  Every 6 hours 08/13/16 1734 08/14/16 0217   08/13/16 1235  ceFAZolin (ANCEF) 2-4 GM/100ML-% IVPB    Comments:  Waldron Session   : cabinet override      08/13/16 1235 08/14/16 0044     and started on DVT prophylaxis in the form of Xarelto.   PT and OT were ordered for total hip protocol.  The patient was allowed to be WBAT with therapy. Discharge planning was consulted to help with postop disposition and equipment needs.  Patient had a good night on the evening of surgery with some pain.  They started to get up OOB with therapy on day one.  Hemovac drain was pulled without difficulty.  Continued to work with therapy into day two.  Dressing was changed on day two and the incision was healing well.   Patient was seen in rounds on POD 2 with Dr. Maureen Ralphs and patient was ready to go home.  Diet: Cardiac diet Activity:WBAT Follow-up:in 2 weeks Disposition - Home Discharged Condition: improving   Discharge Instructions    Call MD / Call 911    Complete by:  As directed    If you experience chest pain or shortness of breath, CALL 911 and be transported to the hospital emergency room.  If you develope a fever above 101 F, pus (white drainage) or increased drainage or redness at the wound, or calf pain, call your surgeon's office.   Change dressing    Complete by:  As directed    You may change your dressing dressing daily with sterile 4 x 4 inch gauze dressing and paper tape.  Do not submerge the incision under  water.   Constipation Prevention    Complete by:  As directed    Drink plenty of fluids.  Prune juice may be helpful.  You may use a stool softener, such as Colace (over the counter) 100 mg twice a day.  Use MiraLax (over the counter) for constipation as needed.   Diet - low sodium heart healthy    Complete by:  As directed    Discharge instructions    Complete by:  As directed    Pick up stool softner and laxative for home use following surgery while on pain medications. Do not submerge  incision under water. Please use good hand washing techniques while changing dressing each day. May shower starting three days after surgery. Please use a clean towel to pat the incision dry following showers. Continue to use ice for pain and swelling after surgery. Do not use any lotions or creams on the incision until instructed by your surgeon.  Wear both TED hose on both legs during the day every day for three weeks, but may have off at night at home.  Postoperative Constipation Protocol  Constipation - defined medically as fewer than three stools per week and severe constipation as less than one stool per week.  One of the most common issues patients have following surgery is constipation.  Even if you have a regular bowel pattern at home, your normal regimen is likely to be disrupted due to multiple reasons following surgery.  Combination of anesthesia, postoperative narcotics, change in appetite and fluid intake all can affect your bowels.  In order to avoid complications following surgery, here are some recommendations in order to help you during your recovery period.  Colace (docusate) - Pick up an over-the-counter form of Colace or another stool softener and take twice a day as long as you are requiring postoperative pain medications.  Take with a full glass of water daily.  If you experience loose stools or diarrhea, hold the colace until you stool forms back up.  If your symptoms do not get better within 1 week or if they get worse, check with your doctor.  Dulcolax (bisacodyl) - Pick up over-the-counter and take as directed by the product packaging as needed to assist with the movement of your bowels.  Take with a full glass of water.  Use this product as needed if not relieved by Colace only.   MiraLax (polyethylene glycol) - Pick up over-the-counter to have on hand.  MiraLax is a solution that will increase the amount of water in your bowels to assist with bowel movements.  Take as  directed and can mix with a glass of water, juice, soda, coffee, or tea.  Take if you go more than two days without a movement. Do not use MiraLax more than once per day. Call your doctor if you are still constipated or irregular after using this medication for 7 days in a row.  If you continue to have problems with postoperative constipation, please contact the office for further assistance and recommendations.  If you experience "the worst abdominal pain ever" or develop nausea or vomiting, please contact the office immediatly for further recommendations for treatment.   Take Xarelto for two and a half more weeks, then discontinue Xarelto. Once the patient has completed the blood thinner regimen, then take a Baby 81 mg Aspirin daily for three more weeks.    Do not sit on low chairs, stoools or toilet seats, as it may be difficult to get up  from low surfaces    Complete by:  As directed    Driving restrictions    Complete by:  As directed    No driving until released by the physician.   Increase activity slowly as tolerated    Complete by:  As directed    Lifting restrictions    Complete by:  As directed    No lifting until released by the physician.   Patient may shower    Complete by:  As directed    You may shower without a dressing once there is no drainage.  Do not wash over the wound.  If drainage remains, do not shower until drainage stops.   TED hose    Complete by:  As directed    Use stockings (TED hose) for 3 weeks on both leg(s).  You may remove them at night for sleeping.   Weight bearing as tolerated    Complete by:  As directed    Laterality:  right   Extremity:  Lower     Allergies as of 08/14/2016      Reactions   Prednisone Rash      Medication List    STOP taking these medications   amoxicillin 500 MG capsule Commonly known as:  AMOXIL   HYDROcodone-acetaminophen 5-325 MG tablet Commonly known as:  NORCO/VICODIN   indomethacin 50 MG capsule Commonly  known as:  INDOCIN   naproxen 500 MG tablet Commonly known as:  NAPROSYN   naproxen sodium 220 MG tablet Commonly known as:  ANAPROX   VIAGRA 100 MG tablet Generic drug:  sildenafil     TAKE these medications   acetaminophen 500 MG tablet Commonly known as:  TYLENOL Take 1,000 mg by mouth daily as needed for headache.   colchicine 0.6 MG tablet Take 1.2 mg PO, one hour later by another 0.6 mg on first day; then 0.6 mg twice daily until attack resolved.1 What changed:  how much to take  how to take this  when to take this  reasons to take this  additional instructions   fluticasone 50 MCG/ACT nasal spray Commonly known as:  FLONASE USE 2 SPRAYS INTO EACH NOSTRIL ONCE DAILY AS DIRECTED What changed:  See the new instructions.   methocarbamol 500 MG tablet Commonly known as:  ROBAXIN Take 1 tablet (500 mg total) by mouth every 6 (six) hours as needed for muscle spasms.   oxyCODONE 5 MG immediate release tablet Commonly known as:  Oxy IR/ROXICODONE Take 1-2 tablets (5-10 mg total) by mouth every 4 (four) hours as needed for moderate pain or severe pain.   pantoprazole 40 MG tablet Commonly known as:  PROTONIX Take 40 mg by mouth daily as needed (acid reflux).   PROAIR HFA 108 (90 Base) MCG/ACT inhaler Generic drug:  albuterol INHALE 2 PUFFS EVERY 6 HOURS AS NEEDED FOR WHEEZING   rivaroxaban 10 MG Tabs tablet Commonly known as:  XARELTO Take 1 tablet (10 mg total) by mouth daily with breakfast. Take Xarelto for two and a half more weeks following discharge from the hospital, then discontinue Xarelto. Once the patient has completed the blood thinner regimen, then take a Baby 81 mg Aspirin daily for three more weeks. Start taking on:  08/15/2016   traMADol 50 MG tablet Commonly known as:  ULTRAM Take 1-2 tablets (50-100 mg total) by mouth every 6 (six) hours as needed for moderate pain. What changed:  how much to take  when to take this  reasons to take  this      Follow-up Information    Gearlean Alf, MD. Schedule an appointment as soon as possible for a visit on 08/26/2016.   Specialty:  Orthopedic Surgery Contact information: [address] [place] [address] [phone]           Signed: Arlee Muslim, PA-C Orthopaedic Surgery 08/14/2016, 8:40 AM

## 2016-08-14 NOTE — Care Management Note (Signed)
Case Management Note  Patient Details  Name: [name] MRN: [id number] Date of Birth: Dec 20, 1945  Subjective/Objective:                  R THR Action/Plan: Discharge planning Expected Discharge Date:  08/14/16               Expected Discharge Plan:  [place]  In-House Referral:     Discharge planning Services  CM Consult  Post Acute Care Choice:  Home Health Choice offered to:  Patient  DME Arranged:  N/A DME Agency:  NA  HH Arranged:  PT [place] Agency:  [facility] ([facility]  Status of Service:  Completed, signed off  If discussed at [employer], dates discussed:    Additional Comments: CM met with pt in room to offer choice of home health agency. Pt chooses [facility] to render HHPT. Referral given to [facility] rep, Tim. Pt states he has all DME needed at home. NO other CM needs were communicated. Dellie Catholic, RN 08/14/2016, 1:08 PM

## 2016-08-14 NOTE — Progress Notes (Signed)
   Subjective: 1 Day Post-Op Procedure(s) (LRB): RIGHT TOTAL HIP ARTHROPLASTY ANTERIOR APPROACH (Right) Patient reports pain as mild.   Patient seen in rounds by Dr. Wynelle Link. Patient is well, but has had some minor complaints of pain in the hip, requiring pain medications We will start therapy today.  If they do well with therapy and meets all goals, then will allow home later this afternoon following therapy. Plan is to go home with wife after hospital stay.  Objective: Vital signs in last 24 hours: Temp:  [97.5 F (36.4 C)-98.2 F (36.8 C)] 97.9 F (36.6 C) (03/22 0867) Pulse Rate:  [57-123] 65 (03/22 0633) Resp:  [9-18] 18 (03/22 0633) BP: (94-159)/(57-79) 109/62 (03/22 0633) SpO2:  [96 %-100 %] 99 % (03/22 6195) Weight:  [93.4 kg (206 lb)] 93.4 kg (206 lb) (03/21 1219)  Intake/Output from previous day:  Intake/Output Summary (Last 24 hours) at 08/14/16 0833 Last data filed at 08/14/16 0805  Gross per 24 hour  Intake          4793.33 ml  Output             3060 ml  Net          1733.33 ml    Intake/Output this shift: Total I/O In: 1408.3 [I.V.:1408.3] Out: -   Labs:  Recent Labs  08/14/16 0417  HGB 12.7*    Recent Labs  08/14/16 0417  WBC 9.0  RBC 4.16*  HCT 36.0*  PLT 256    Recent Labs  08/14/16 0417  NA 138  K 3.9  CL 105  CO2 26  BUN 11  CREATININE 0.90  GLUCOSE 170*  CALCIUM 8.5*   No results for input(s): LABPT, INR in the last 72 hours.  EXAM General - Patient is Alert, Appropriate and Oriented Extremity - Neurovascular intact Sensation intact distally Intact pulses distally Dorsiflexion/Plantar flexion intact Dressing - dressing C/D/I Motor Function - intact, moving foot and toes well on exam.  Hemovac pulled without difficulty.  Past Medical History:  Diagnosis Date  . AR (allergic rhinitis)   . Arthritis    PAIN AND OA LEFT KNEE ;  S/P RIGHT TOTAL KNEE REPLACEMENT  . Complication of anesthesia    STATES SEVERE PAIN WAKING  UP AFTER GENERAL ANESTHESIA; AND FEELS BAD.  . ED (erectile dysfunction)   . GERD (gastroesophageal reflux disease)   . Gout    per pt  . History of kidney stones   . History of nephrolithiasis    seen Dr. [facility], last 02/2011  . Hypercholesterolemia   . RBBB (right bundle branch block)     Assessment/Plan: 1 Day Post-Op Procedure(s) (LRB): RIGHT TOTAL HIP ARTHROPLASTY ANTERIOR APPROACH (Right) Principal Problem:   OA (osteoarthritis) of hip  Estimated body mass index is 32.26 kg/m as calculated from the following:   Height as of this encounter: 5\' 7"  (1.702 m).   Weight as of this encounter: 93.4 kg (206 lb). Advance diet Up with therapy Discharge home with home health  DVT Prophylaxis - Xarelto Weight Bearing As Tolerated right Leg Hemovac Pulled Begin Therapy  If meets goals and able to go home: Discharge home with home health Diet - Cardiac diet Follow up - in 2 weeks Activity - WBAT Disposition - Home Condition Upon Discharge - pending therapy D/C Meds - See DC Summary DVT Prophylaxis - [place], PA-C Orthopaedic Surgery 08/14/2016, 8:33 AM

## 2016-08-14 NOTE — Evaluation (Signed)
Physical Therapy Evaluation Patient Details Name: [name] MRN: [id number] DOB: 06-May-1946 Today's Date: 08/14/2016   History of Present Illness  Pt s/p R THR and with hx of B TKR  Clinical Impression  Pt s/p R THR and presents with decreased R LE strength/ROM and post op pain limiting functional mobility.  Pt should progress to dc home with family assist and HHPT follow up.    Follow Up Recommendations      Equipment Recommendations  None recommended by PT    Recommendations for Other Services       Precautions / Restrictions Precautions Precautions: Fall Restrictions Weight Bearing Restrictions: No Other Position/Activity Restrictions: WBAT      Mobility  Bed Mobility Overal bed mobility: Needs Assistance Bed Mobility: Supine to Sit     Supine to sit: Min assist     General bed mobility comments: cues for sequence and use of L LE to self assist  Transfers Overall transfer level: Needs assistance Equipment used: Rolling walker (2 wheeled) Transfers: Sit to/from Stand Sit to Stand: Min assist         General transfer comment: cues for LE management and use of UEs to self assist  Ambulation/Gait Ambulation/Gait assistance: Min assist Ambulation Distance (Feet): 75 Feet Assistive device: Rolling walker (2 wheeled) Gait Pattern/deviations: Step-to pattern;Decreased step length - right;Decreased step length - left;Shuffle;Trunk flexed Gait velocity: decr Gait velocity interpretation: Below normal speed for age/gender General Gait Details: Increased time and cues for posture, position from RW and initial sequence  Stairs            Wheelchair Mobility    Modified Rankin (Stroke Patients Only)       Balance                                             Pertinent Vitals/Pain Pain Assessment: 0-10 Pain Score: 4  Pain Location: R hip Pain Descriptors / Indicators: Aching;Sore Pain Intervention(s): Limited activity  within patient's tolerance;Monitored during session;Premedicated before session;Ice applied    Home Living Family/patient expects to be discharged to:: Private residence Living Arrangements: Spouse/significant other Available Help at Discharge: Family Type of Home: House Home Access: Stairs to enter Entrance Stairs-Rails: Right Entrance Stairs-Number of Steps: [place]: One level Home Equipment: Environmental consultant - 2 wheels;Cane - single point;Crutches;Bedside commode      Prior Function Level of Independence: Independent with assistive device(s)         Comments: used crutch     Hand Dominance        Extremity/Trunk Assessment   Upper Extremity Assessment Upper Extremity Assessment: Overall WFL for tasks assessed    Lower Extremity Assessment Lower Extremity Assessment: LLE deficits/detail LLE Deficits / Details: Strength at hip 2+/5 with AAROM at hip to 80 flex and 20 abd       Communication   Communication: No difficulties  Cognition Arousal/Alertness: Awake/alert Behavior During Therapy: WFL for tasks assessed/performed Overall Cognitive Status: Within Functional Limits for tasks assessed                      General Comments      Exercises Total Joint Exercises Ankle Circles/Pumps: AROM;Both;20 reps;Supine Quad Sets: AAROM;Both;10 reps;Supine Heel Slides: AAROM;20 reps;Right;Supine Hip ABduction/ADduction: AAROM;Right;15 reps;Supine   Assessment/Plan    PT Assessment Patient needs continued PT services  PT Problem List Decreased  strength;Decreased range of motion;Decreased activity tolerance;Decreased mobility;Decreased knowledge of use of DME;Pain       PT Treatment Interventions DME instruction;Gait training;Stair training;Functional mobility training;Therapeutic activities;Therapeutic exercise;Patient/family education    PT Goals (Current goals can be found in the Care Plan section)  Acute Rehab PT Goals Patient Stated Goal: Be able to  take walks with my wife again PT Goal Formulation: With patient Time For Goal Achievement: 08/16/16 Potential to Achieve Goals: Good    Frequency 7X/week   Barriers to discharge        Co-evaluation               End of Session Equipment Utilized During Treatment: Gait belt Activity Tolerance: Patient tolerated treatment well Patient left: in chair;with call bell/phone within reach;with family/visitor present Nurse Communication: Mobility status PT Visit Diagnosis: Unsteadiness on feet (R26.81)         Time: 9914-4458 PT Time Calculation (min) (ACUTE ONLY): 40 min   Charges:   PT Evaluation $PT Eval Low Complexity: 1 Procedure PT Treatments $Gait Training: 8-22 mins $Therapeutic Exercise: 8-22 mins   PT G Codes:         Azusena Erlandson 08/14/2016, 12:31 PM

## 2016-08-14 NOTE — Progress Notes (Signed)
Physical Therapy Treatment Patient Details Name: [name] MRN: [id number] DOB: 11-24-1945 Today's Date: 08/14/2016    History of Present Illness Pt s/p R THR and with hx of B TKR    PT Comments    Pt motivated but requiring increased time for all tasks and progressing slowly 2* pain level.   Follow Up Recommendations  Home health PT     Equipment Recommendations  None recommended by PT    Recommendations for Other Services       Precautions / Restrictions Precautions Precautions: Fall Restrictions Weight Bearing Restrictions: No Other Position/Activity Restrictions: WBAT    Mobility  Bed Mobility Overal bed mobility: Needs Assistance Bed Mobility: Sit to Supine       Sit to supine: Min assist;Mod assist   General bed mobility comments: cues for sequence and use of L LE to self assist  Transfers Overall transfer level: Needs assistance Equipment used: Rolling walker (2 wheeled) Transfers: Sit to/from Stand Sit to Stand: Min assist         General transfer comment: cues for LE management and use of UEs to self assist  Ambulation/Gait Ambulation/Gait assistance: Min assist Ambulation Distance (Feet): 90 Feet Assistive device: Rolling walker (2 wheeled) Gait Pattern/deviations: Step-to pattern;Decreased step length - right;Decreased step length - left;Shuffle;Trunk flexed Gait velocity: decr Gait velocity interpretation: Below normal speed for age/gender General Gait Details: Increased time and cues for posture, position from RW and initial sequence   Stairs            Wheelchair Mobility    Modified Rankin (Stroke Patients Only)       Balance                                    Cognition Arousal/Alertness: Awake/alert Behavior During Therapy: WFL for tasks assessed/performed Overall Cognitive Status: Within Functional Limits for tasks assessed                      Exercises      General Comments        Pertinent Vitals/Pain Pain Assessment: 0-10 Pain Score: 5  Pain Location: R hip Pain Descriptors / Indicators: Aching;Sore Pain Intervention(s): Limited activity within patient's tolerance;Monitored during session;Premedicated before session;Ice applied    Home Living                      Prior Function            PT Goals (current goals can now be found in the care plan section) Acute Rehab PT Goals Patient Stated Goal: Be able to take walks with my wife again PT Goal Formulation: With patient Time For Goal Achievement: 08/16/16 Potential to Achieve Goals: Good Progress towards PT goals: Progressing toward goals    Frequency    7X/week      PT Plan Current plan remains appropriate    Co-evaluation             End of Session Equipment Utilized During Treatment: Gait belt Activity Tolerance: Patient tolerated treatment well Patient left: in bed;with call bell/phone within reach;with family/visitor present Nurse Communication: Mobility status PT Visit Diagnosis: Unsteadiness on feet (R26.81)     Time: 1320-1407 PT Time Calculation (min) (ACUTE ONLY): 47 min  Charges:  $Gait Training: 23-37 mins $Therapeutic Activity: 8-22 mins  G Codes:       Mckale Haffey 09-13-16, 4:29 PM

## 2016-08-14 NOTE — Discharge Instructions (Addendum)
° °Dr. Frank Aluisio °Total Joint Specialist °[facility] °[address] °[place], [place] [address] °[phone] ° °ANTERIOR APPROACH TOTAL HIP REPLACEMENT POSTOPERATIVE DIRECTIONS ° ° °Hip Rehabilitation, Guidelines Following Surgery  °The results of a hip operation are greatly improved after range of motion and muscle strengthening exercises. Follow all safety measures which are given to protect your hip. If any of these exercises cause increased pain or swelling in your joint, decrease the amount until you are comfortable again. Then slowly increase the exercises. Call your caregiver if you have problems or questions.  ° °HOME CARE INSTRUCTIONS  °Remove items at home which could result in a fall. This includes throw rugs or furniture in walking pathways.  °· ICE to the affected hip every three hours for 30 minutes at a time and then as needed for pain and swelling.  Continue to use ice on the hip for pain and swelling from surgery. You may notice swelling that will progress down to the foot and ankle.  This is normal after surgery.  Elevate the leg when you are not up walking on it.   °· Continue to use the breathing machine which will help keep your temperature down.  It is common for your temperature to cycle up and down following surgery, especially at night when you are not up moving around and exerting yourself.  The breathing machine keeps your lungs expanded and your temperature down. ° ° °DIET °You may resume your previous home diet once your are discharged from the hospital. ° °DRESSING / WOUND CARE / SHOWERING °You may shower 3 days after surgery, but keep the wounds dry during showering.  You may use an occlusive plastic wrap (Press'n Seal for example), NO SOAKING/SUBMERGING IN THE BATHTUB.  If the bandage gets wet, change with a clean dry gauze.  If the incision gets wet, pat the wound dry with a clean towel. °You may start showering once you are discharged home but do not  submerge the incision under water. Just pat the incision dry and apply a dry gauze dressing on daily. °Change the surgical dressing daily and reapply a dry dressing each time. ° °ACTIVITY °Walk with your walker as instructed. °Use walker as long as suggested by your caregivers. °Avoid periods of inactivity such as sitting longer than an hour when not asleep. This helps prevent blood clots.  °You may resume a sexual relationship in one month or when given the OK by your doctor.  °You may return to work once you are cleared by your doctor.  °Do not drive a car for 6 weeks or until released by you surgeon.  °Do not drive while taking narcotics. ° °WEIGHT BEARING °Weight bearing as tolerated with assist device (walker, cane, etc) as directed, use it as long as suggested by your surgeon or therapist, typically at least 4-6 weeks. ° °POSTOPERATIVE CONSTIPATION PROTOCOL °Constipation - defined medically as fewer than three stools per week and severe constipation as less than one stool per week. ° °One of the most common issues patients have following surgery is constipation.  Even if you have a regular bowel pattern at home, your normal regimen is likely to be disrupted due to multiple reasons following surgery.  Combination of anesthesia, postoperative narcotics, change in appetite and fluid intake all can affect your bowels.  In order to avoid complications following surgery, here are some recommendations in order to help you during your recovery period. ° °Colace (docusate) - Pick up an over-the-counter   form of Colace or another stool softener and take twice a day as long as you are requiring postoperative pain medications.  Take with a full glass of water daily.  If you experience loose stools or diarrhea, hold the colace until you stool forms back up.  If your symptoms do not get better within 1 week or if they get worse, check with your doctor. ° °Dulcolax (bisacodyl) - Pick up over-the-counter and take as directed  by the product packaging as needed to assist with the movement of your bowels.  Take with a full glass of water.  Use this product as needed if not relieved by Colace only.  ° °MiraLax (polyethylene glycol) - Pick up over-the-counter to have on hand.  MiraLax is a solution that will increase the amount of water in your bowels to assist with bowel movements.  Take as directed and can mix with a glass of water, juice, soda, coffee, or tea.  Take if you go more than two days without a movement. °Do not use MiraLax more than once per day. Call your doctor if you are still constipated or irregular after using this medication for 7 days in a row. ° °If you continue to have problems with postoperative constipation, please contact the office for further assistance and recommendations.  If you experience "the worst abdominal pain ever" or develop nausea or vomiting, please contact the office immediatly for further recommendations for treatment. ° °ITCHING ° If you experience itching with your medications, try taking only a single pain pill, or even half a pain pill at a time.  You can also use Benadryl over the counter for itching or also to help with sleep.  ° °TED HOSE STOCKINGS °Wear the elastic stockings on both legs for three weeks following surgery during the day but you may remove then at night for sleeping. ° °MEDICATIONS °See your medication summary on the “After Visit Summary” that the nursing staff will review with you prior to discharge.  You may have some home medications which will be placed on hold until you complete the course of blood thinner medication.  It is important for you to complete the blood thinner medication as prescribed by your surgeon.  Continue your approved medications as instructed at time of discharge. ° °PRECAUTIONS °If you experience chest pain or shortness of breath - call 911 immediately for transfer to the hospital emergency department.  °If you develop a fever greater that 101 F,  purulent drainage from wound, increased redness or drainage from wound, foul odor from the wound/dressing, or calf pain - CONTACT YOUR SURGEON.   °                                                °FOLLOW-UP APPOINTMENTS °Make sure you keep all of your appointments after your operation with your surgeon and caregivers. You should call the office at the above phone number and make an appointment for approximately two weeks after the date of your surgery or on the date instructed by your surgeon outlined in the "After Visit Summary". ° °RANGE OF MOTION AND STRENGTHENING EXERCISES  °These exercises are designed to help you keep full movement of your hip joint. Follow your caregiver's or physical therapist's instructions. Perform all exercises about fifteen times, three times per day or as directed. Exercise both hips, even if you   have had only one joint replacement. These exercises can be done on a training (exercise) mat, on the floor, on a table or on a bed. Use whatever works the best and is most comfortable for you. Use music or television while you are exercising so that the exercises are a pleasant break in your day. This will make your life better with the exercises acting as a break in routine you can look forward to.  Lying on your back, slowly slide your foot toward your buttocks, raising your knee up off the floor. Then slowly slide your foot back down until your leg is straight again.  Lying on your back spread your legs as far apart as you can without causing discomfort.  Lying on your side, raise your upper leg and foot straight up from the floor as far as is comfortable. Slowly lower the leg and repeat.  Lying on your back, tighten up the muscle in the front of your thigh (quadriceps muscles). You can do this by keeping your leg straight and trying to raise your heel off the floor. This helps strengthen the largest muscle supporting your knee.  Lying on your back, tighten up the muscles of your  buttocks both with the legs straight and with the knee bent at a comfortable angle while keeping your heel on the floor.   IF YOU ARE TRANSFERRED TO A SKILLED REHAB FACILITY If the patient is transferred to a skilled rehab facility following release from the hospital, a list of the current medications will be sent to the facility for the patient to continue.  When discharged from the skilled rehab facility, please have the facility set up the patient's [place] prior to being released. Also, the skilled facility will be responsible for providing the patient with their medications at time of release from the facility to include their pain medication, the muscle relaxants, and their blood thinner medication. If the patient is still at the rehab facility at time of the two week follow up appointment, the skilled rehab facility will also need to assist the patient in arranging follow up appointment in our office and any transportation needs.  MAKE SURE YOU:  Understand these instructions.  Get help right away if you are not doing well or get worse.    Pick up stool softner and laxative for home use following surgery while on pain medications. Do not submerge incision under water. Please use good hand washing techniques while changing dressing each day. May shower starting three days after surgery. Please use a clean towel to pat the incision dry following showers. Continue to use ice for pain and swelling after surgery. Do not use any lotions or creams on the incision until instructed by your surgeon.  Take Xarelto for two and a half more weeks following discharge from the hospital, then discontinue Xarelto. Once the patient has completed the blood thinner regimen, then take a Baby 81 mg Aspirin daily for three more weeks.   Information on my medicine - XARELTO (Rivaroxaban)  This medication education was reviewed with me or my healthcare representative as part of my  discharge preparation.  The pharmacist that spoke with me during my hospital stay was:  Tommie Raymond, Student-PharmD  Why was Xarelto prescribed for you? Xarelto was prescribed for you to reduce the risk of blood clots forming after orthopedic surgery. The medical term for these abnormal blood clots is venous thromboembolism (VTE).  What do you need to know about  xarelto ? Take your Xarelto ONCE DAILY at the same time every day. You may take it either with or without food.  If you have difficulty swallowing the tablet whole, you may crush it and mix in applesauce just prior to taking your dose.  Take Xarelto exactly as prescribed by your doctor and DO NOT stop taking Xarelto without talking to the doctor who prescribed the medication.  Stopping without other VTE prevention medication to take the place of Xarelto may increase your risk of developing a clot.  After discharge, you should have regular check-up appointments with your healthcare provider that is prescribing your Xarelto.    What do you do if you miss a dose? If you miss a dose, take it as soon as you remember on the same day then continue your regularly scheduled once daily regimen the next day. Do not take two doses of Xarelto on the same day.   Important Safety Information A possible side effect of Xarelto is bleeding. You should call your healthcare provider right away if you experience any of the following: ? Bleeding from an injury or your nose that does not stop. ? Unusual colored urine (red or dark brown) or unusual colored stools (red or black). ? Unusual bruising for unknown reasons. ? A serious fall or if you hit your head (even if there is no bleeding).  Some medicines may interact with Xarelto and might increase your risk of bleeding while on Xarelto. To help avoid this, consult your healthcare provider or pharmacist prior to using any new prescription or non-prescription medications, including herbals,  vitamins, non-steroidal anti-inflammatory drugs (NSAIDs) and supplements.  This website has more information on Xarelto: [email or web address]

## 2016-08-15 LAB — CBC
HCT: 36.3 % — ABNORMAL LOW (ref 39.0–52.0)
HEMOGLOBIN: 12.5 g/dL — AB (ref 13.0–17.0)
MCH: 30.3 pg (ref 26.0–34.0)
MCHC: 34.4 g/dL (ref 30.0–36.0)
MCV: 87.9 fL (ref 78.0–100.0)
PLATELETS: 261 10*3/uL (ref 150–400)
RBC: 4.13 MIL/uL — AB (ref 4.22–5.81)
RDW: 13.1 % (ref 11.5–15.5)
WBC: 10.8 10*3/uL — AB (ref 4.0–10.5)

## 2016-08-15 LAB — BASIC METABOLIC PANEL
ANION GAP: 8 (ref 5–15)
BUN: 19 mg/dL (ref 6–20)
CHLORIDE: 106 mmol/L (ref 101–111)
CO2: 27 mmol/L (ref 22–32)
Calcium: 8.5 mg/dL — ABNORMAL LOW (ref 8.9–10.3)
Creatinine, Ser: 1.02 mg/dL (ref 0.61–1.24)
Glucose, Bld: 115 mg/dL — ABNORMAL HIGH (ref 65–99)
POTASSIUM: 3.8 mmol/L (ref 3.5–5.1)
SODIUM: 141 mmol/L (ref 135–145)

## 2016-08-15 NOTE — Progress Notes (Signed)
qPhysical Therapy Treatment Patient Details Name: [name] MRN: [id number] DOB: June 09, 1945 Today's Date: 08/15/2016    History of Present Illness Pt s/p R THR and with hx of B TKR    PT Comments    Pt progressing well from yesterday and eager for dc home today.  Will return to review stairs and car transfers.   Follow Up Recommendations  Home health PT     Equipment Recommendations  None recommended by PT    Recommendations for Other Services OT consult     Precautions / Restrictions Precautions Precautions: Fall Restrictions Weight Bearing Restrictions: No Other Position/Activity Restrictions: WBAT    Mobility  Bed Mobility Overal bed mobility: Needs Assistance Bed Mobility: Supine to Sit;Sit to Supine     Supine to sit: Min guard Sit to supine: Min guard   General bed mobility comments: cues for sequence and use of L LE to self assist  Transfers Overall transfer level: Needs assistance Equipment used: Rolling walker (2 wheeled) Transfers: Sit to/from Stand Sit to Stand: Min guard;Supervision         General transfer comment: cues for LE management and use of UEs to self assist  Ambulation/Gait Ambulation/Gait assistance: Min guard;Supervision Ambulation Distance (Feet): 150 Feet Assistive device: Rolling walker (2 wheeled) Gait Pattern/deviations: Step-to pattern;Decreased step length - right;Decreased step length - left;Shuffle;Trunk flexed Gait velocity: decr Gait velocity interpretation: Below normal speed for age/gender General Gait Details: Increased time and cues for posture, position from RW and initial sequence   Stairs            Wheelchair Mobility    Modified Rankin (Stroke Patients Only)       Balance                                            Cognition Arousal/Alertness: Awake/alert Behavior During Therapy: WFL for tasks assessed/performed Overall Cognitive Status: Within Functional Limits for  tasks assessed                                        Exercises Total Joint Exercises Ankle Circles/Pumps: AROM;Both;20 reps;Supine Quad Sets: AAROM;Both;10 reps;Supine Heel Slides: AAROM;20 reps;Right;Supine Hip ABduction/ADduction: AAROM;Right;15 reps;Supine Long Arc Quad: AAROM;Right;10 reps;Seated    General Comments        Pertinent Vitals/Pain Pain Assessment: 0-10 Pain Score: 4  Pain Location: R hip Pain Descriptors / Indicators: Aching;Sore Pain Intervention(s): Premedicated before session;Limited activity within patient's tolerance;Monitored during session;Ice applied    Home Living                      Prior Function            PT Goals (current goals can now be found in the care plan section) Acute Rehab PT Goals Patient Stated Goal: Be able to take walks with my wife again PT Goal Formulation: With patient Time For Goal Achievement: 08/16/16 Potential to Achieve Goals: Good Progress towards PT goals: Progressing toward goals    Frequency    7X/week      PT Plan Current plan remains appropriate    Co-evaluation             End of Session Equipment Utilized During Treatment: Gait belt Activity Tolerance: Patient tolerated treatment well Patient  left: in chair;with call bell/phone within reach;with family/visitor present Nurse Communication: Mobility status PT Visit Diagnosis: Unsteadiness on feet (R26.81)     Time: 5320-2334 PT Time Calculation (min) (ACUTE ONLY): 43 min  Charges:  $Gait Training: 8-22 mins $Therapeutic Exercise: 8-22 mins $Therapeutic Activity: 8-22 mins                    G Codes:      [name] 08/19/16, 11:59 AM

## 2016-08-15 NOTE — Progress Notes (Signed)
qPhysical Therapy Treatment Patient Details Name: [name] MRN: [id number] DOB: 11-Mar-1946 Today's Date: 08/15/2016    History of Present Illness Pt s/p R THR and with hx of B TKR    PT Comments    Pt limited by fatigue and requiring frequent rest breaks but reviewed car transfers and stairs with pt and spouse.   Follow Up Recommendations  Home health PT     Equipment Recommendations  None recommended by PT    Recommendations for Other Services OT consult     Precautions / Restrictions Precautions Precautions: Fall Restrictions Weight Bearing Restrictions: No Other Position/Activity Restrictions: WBAT    Mobility  Bed Mobility Overal bed mobility: Needs Assistance Bed Mobility: Supine to Sit;Sit to Supine     Supine to sit: Min guard Sit to supine: Min guard   General bed mobility comments: cues for sequence and use of L LE to self assist  Transfers Overall transfer level: Needs assistance Equipment used: Rolling walker (2 wheeled) Transfers: Sit to/from Stand Sit to Stand: Supervision         General transfer comment: Pt self-cues for LE management and use of UEs to self assist  Ambulation/Gait Ambulation/Gait assistance: Min guard;Supervision Ambulation Distance (Feet): 40 Feet Assistive device: Rolling walker (2 wheeled) Gait Pattern/deviations: Step-to pattern;Decreased step length - right;Decreased step length - left;Shuffle;Trunk flexed Gait velocity: decr Gait velocity interpretation: Below normal speed for age/gender General Gait Details: Increased time and cues for posture, position from RW and initial sequence   Stairs Stairs: Yes   Stair Management: One rail Left;Step to pattern;Forwards;With crutches Number of Stairs: 8 General stair comments: 4 steps twice with cues for sequence and foot/crutch placement.  Spouse assisting on second attempt  Wheelchair Mobility    Modified Rankin (Stroke Patients Only)       Balance                                             Cognition Arousal/Alertness: Awake/alert Behavior During Therapy: WFL for tasks assessed/performed Overall Cognitive Status: Within Functional Limits for tasks assessed                                        Exercises Total Joint Exercises Ankle Circles/Pumps: AROM;Both;20 reps;Supine Quad Sets: AAROM;Both;10 reps;Supine Heel Slides: AAROM;20 reps;Right;Supine Hip ABduction/ADduction: AAROM;Right;15 reps;Supine Long Arc Quad: AAROM;Right;10 reps;Seated    General Comments        Pertinent Vitals/Pain Pain Assessment: 0-10 Pain Score: 5  Pain Location: R hip Pain Descriptors / Indicators: Aching;Sore Pain Intervention(s): Limited activity within patient's tolerance;Monitored during session;Premedicated before session;Ice applied    Home Living                      Prior Function            PT Goals (current goals can now be found in the care plan section) Acute Rehab PT Goals Patient Stated Goal: Be able to take walks with my wife again PT Goal Formulation: With patient Time For Goal Achievement: 08/16/16 Potential to Achieve Goals: Good Progress towards PT goals: Progressing toward goals    Frequency    7X/week      PT Plan Current plan remains appropriate    Co-evaluation  End of Session Equipment Utilized During Treatment: Gait belt Activity Tolerance: Patient tolerated treatment well;Patient limited by fatigue Patient left: in chair;with call bell/phone within reach;with family/visitor present Nurse Communication: Mobility status PT Visit Diagnosis: Unsteadiness on feet (R26.81)     Time: 1901-2224 PT Time Calculation (min) (ACUTE ONLY): 38 min  Charges:  $Gait Training: 8-22 mins $Therapeutic Exercise: 8-22 mins $Therapeutic Activity: 23-37 mins                    G Codes:         Claudine Stallings 08/15/2016, 12:04 PM

## 2016-08-15 NOTE — Progress Notes (Signed)
   Subjective: 2 Days Post-Op Procedure(s) (LRB): RIGHT TOTAL HIP ARTHROPLASTY ANTERIOR APPROACH (Right) Patient reports pain as mild.   Patient seen in rounds with Dr. Wynelle Link.  Family in room. Sitting up eating breakfast.  Sore yesterday but ready to go home. Patient is well, but has had some minor complaints of pain in the hip, requiring pain medications Patient is ready to go after therapy goals today.  Objective: Vital signs in last 24 hours: Temp:  [97.6 F (36.4 C)-99.1 F (37.3 C)] 97.6 F (36.4 C) (03/23 0548) Pulse Rate:  [70-80] 70 (03/23 0548) Resp:  [18] 18 (03/23 0548) BP: (99-146)/(48-69) 141/67 (03/23 0548) SpO2:  [93 %-97 %] 96 % (03/23 0548)  Intake/Output from previous day:  Intake/Output Summary (Last 24 hours) at 08/15/16 0755 Last data filed at 08/14/16 2042  Gross per 24 hour  Intake          2088.33 ml  Output             1100 ml  Net           988.33 ml    Intake/Output this shift: No intake/output data recorded.  Labs:  Recent Labs  08/14/16 0417 08/15/16 0421  HGB 12.7* 12.5*    Recent Labs  08/14/16 0417 08/15/16 0421  WBC 9.0 10.8*  RBC 4.16* 4.13*  HCT 36.0* 36.3*  PLT 256 261    Recent Labs  08/14/16 0417 08/15/16 0421  NA 138 141  K 3.9 3.8  CL 105 106  CO2 26 27  BUN 11 19  CREATININE 0.90 1.02  GLUCOSE 170* 115*  CALCIUM 8.5* 8.5*   No results for input(s): LABPT, INR in the last 72 hours.  EXAM: General - Patient is Alert, Appropriate and Oriented Extremity - Neurovascular intact Sensation intact distally Intact pulses distally Dorsiflexion/Plantar flexion intact Incision - clean, dry, no drainage Motor Function - intact, moving foot and toes well on exam.   Assessment/Plan: 2 Days Post-Op Procedure(s) (LRB): RIGHT TOTAL HIP ARTHROPLASTY ANTERIOR APPROACH (Right) Procedure(s) (LRB): RIGHT TOTAL HIP ARTHROPLASTY ANTERIOR APPROACH (Right) Past Medical History:  Diagnosis Date  . AR (allergic rhinitis)    . Arthritis    PAIN AND OA LEFT KNEE ;  S/P RIGHT TOTAL KNEE REPLACEMENT  . Complication of anesthesia    STATES SEVERE PAIN WAKING UP AFTER GENERAL ANESTHESIA; AND FEELS BAD.  . ED (erectile dysfunction)   . GERD (gastroesophageal reflux disease)   . Gout    per pt  . History of kidney stones   . History of nephrolithiasis    seen Dr. [facility], last 02/2011  . Hypercholesterolemia   . RBBB (right bundle branch block)    Principal Problem:   OA (osteoarthritis) of hip  Estimated body mass index is 32.26 kg/m as calculated from the following:   Height as of this encounter: 5\' 7"  (1.702 m).   Weight as of this encounter: 93.4 kg (206 lb). Up with therapy Discharge home with home health Diet - Cardiac diet Follow up - in 2 weeks Activity - WBAT Disposition - Home Condition Upon Discharge - Good D/C Meds - See DC Summary DVT Prophylaxis - [place], PA-C Orthopaedic Surgery 08/15/2016, 7:55 AM

## 2016-08-17 DIAGNOSIS — Z96641 Presence of right artificial hip joint: Secondary | ICD-10-CM | POA: Diagnosis not present

## 2016-08-17 DIAGNOSIS — I4510 Unspecified right bundle-branch block: Secondary | ICD-10-CM | POA: Diagnosis not present

## 2016-08-17 DIAGNOSIS — Z471 Aftercare following joint replacement surgery: Secondary | ICD-10-CM | POA: Diagnosis not present

## 2016-08-17 DIAGNOSIS — M109 Gout, unspecified: Secondary | ICD-10-CM | POA: Diagnosis not present

## 2016-08-17 DIAGNOSIS — Z96653 Presence of artificial knee joint, bilateral: Secondary | ICD-10-CM | POA: Diagnosis not present

## 2016-08-17 DIAGNOSIS — E669 Obesity, unspecified: Secondary | ICD-10-CM | POA: Diagnosis not present

## 2016-08-20 ENCOUNTER — Encounter: Payer: Self-pay | Admitting: *Deleted

## 2016-08-20 DIAGNOSIS — M109 Gout, unspecified: Secondary | ICD-10-CM | POA: Diagnosis not present

## 2016-08-20 DIAGNOSIS — Z96641 Presence of right artificial hip joint: Secondary | ICD-10-CM | POA: Diagnosis not present

## 2016-08-20 DIAGNOSIS — Z471 Aftercare following joint replacement surgery: Secondary | ICD-10-CM | POA: Diagnosis not present

## 2016-08-20 DIAGNOSIS — I4510 Unspecified right bundle-branch block: Secondary | ICD-10-CM | POA: Diagnosis not present

## 2016-08-20 DIAGNOSIS — Z96653 Presence of artificial knee joint, bilateral: Secondary | ICD-10-CM | POA: Diagnosis not present

## 2016-08-20 DIAGNOSIS — E669 Obesity, unspecified: Secondary | ICD-10-CM | POA: Diagnosis not present

## 2016-08-21 DIAGNOSIS — E669 Obesity, unspecified: Secondary | ICD-10-CM | POA: Diagnosis not present

## 2016-08-21 DIAGNOSIS — M109 Gout, unspecified: Secondary | ICD-10-CM | POA: Diagnosis not present

## 2016-08-21 DIAGNOSIS — Z96641 Presence of right artificial hip joint: Secondary | ICD-10-CM | POA: Diagnosis not present

## 2016-08-21 DIAGNOSIS — I4510 Unspecified right bundle-branch block: Secondary | ICD-10-CM | POA: Diagnosis not present

## 2016-08-21 DIAGNOSIS — Z96653 Presence of artificial knee joint, bilateral: Secondary | ICD-10-CM | POA: Diagnosis not present

## 2016-08-21 DIAGNOSIS — Z471 Aftercare following joint replacement surgery: Secondary | ICD-10-CM | POA: Diagnosis not present

## 2016-08-25 DIAGNOSIS — Z96641 Presence of right artificial hip joint: Secondary | ICD-10-CM | POA: Diagnosis not present

## 2016-08-25 DIAGNOSIS — Z471 Aftercare following joint replacement surgery: Secondary | ICD-10-CM | POA: Diagnosis not present

## 2016-08-25 DIAGNOSIS — Z96653 Presence of artificial knee joint, bilateral: Secondary | ICD-10-CM | POA: Diagnosis not present

## 2016-08-25 DIAGNOSIS — I4510 Unspecified right bundle-branch block: Secondary | ICD-10-CM | POA: Diagnosis not present

## 2016-08-25 DIAGNOSIS — M109 Gout, unspecified: Secondary | ICD-10-CM | POA: Diagnosis not present

## 2016-08-25 DIAGNOSIS — E669 Obesity, unspecified: Secondary | ICD-10-CM | POA: Diagnosis not present

## 2016-08-26 DIAGNOSIS — Z471 Aftercare following joint replacement surgery: Secondary | ICD-10-CM | POA: Diagnosis not present

## 2016-08-26 DIAGNOSIS — Z96641 Presence of right artificial hip joint: Secondary | ICD-10-CM | POA: Diagnosis not present

## 2016-08-29 ENCOUNTER — Encounter: Payer: Self-pay | Admitting: Family Medicine

## 2016-08-29 ENCOUNTER — Ambulatory Visit (INDEPENDENT_AMBULATORY_CARE_PROVIDER_SITE_OTHER): Payer: Medicare Other | Admitting: Family Medicine

## 2016-08-29 VITALS — BP 108/60 | HR 71 | Temp 97.8°F | Wt 199.0 lb

## 2016-08-29 DIAGNOSIS — M10071 Idiopathic gout, right ankle and foot: Secondary | ICD-10-CM | POA: Diagnosis not present

## 2016-08-29 DIAGNOSIS — R21 Rash and other nonspecific skin eruption: Secondary | ICD-10-CM

## 2016-08-29 MED ORDER — ALLOPURINOL 100 MG PO TABS
100.0000 mg | ORAL_TABLET | Freq: Every day | ORAL | 6 refills | Status: DC
Start: 2016-08-29 — End: 2017-07-24

## 2016-08-29 NOTE — Progress Notes (Signed)
BP 108/60 (BP Location: Right Arm, Patient Position: Sitting, Cuff Size: Large)   Pulse 71   Temp 97.8 F (36.6 C) (Oral)   Wt 199 lb (90.3 kg)   SpO2 97%   BMI 31.17 kg/m    CC: gout, rash Subjective:    Patient ID: [name], male    DOB: 1945-10-05, 71 y.o.   MRN: [id number]  HPI: [name] is a 71 y.o. male presenting on 08/29/2016 for Gout (Started in right great toe right before hip replacement 2 weeks ago. ) and Rash (thinks it may be related to the Xarelto)   R great toe pain that started right before R hip replacement. Ongoing for the past 3 weeks. May be worsening. Treating with colchicine one a day. Also using tramadol for pain.  2nd gout flare this year.  Interested in preventative medicine.   New itchy rash along trunk that developed 2 days ago. Similar to rash when he was given prednisone. Worse at night time.   s/p R hip replacement 07/2016. Recovering well.  On xarelto for ~3 wks post surgery.   Relevant past medical, surgical, family and social history reviewed and updated as indicated. Interim medical history since our last visit reviewed. Allergies and medications reviewed and updated. Outpatient Medications Prior to Visit  Medication Sig Dispense Refill  . acetaminophen (TYLENOL) 500 MG tablet Take 1,000 mg by mouth daily as needed for headache.     . colchicine 0.6 MG tablet Take 1.2 mg PO, one hour later by another 0.6 mg on first day; then 0.6 mg twice daily until attack resolved.1 (Patient taking differently: Take 0.6 mg by mouth every 8 (eight) hours as needed (gout flares). ) 15 tablet 0  . fluticasone (FLONASE) 50 MCG/ACT nasal spray USE 2 SPRAYS INTO EACH NOSTRIL ONCE DAILY AS DIRECTED (Patient taking differently: USE 2 SPRAYS INTO EACH NOSTRIL ONCE DAILY AS NEEDED FOR CONGESTION) 16 g 9  . methocarbamol (ROBAXIN) 500 MG tablet Take 1 tablet (500 mg total) by mouth every 6 (six) hours as needed for muscle spasms. 80 tablet 0  . oxyCODONE  (OXY IR/ROXICODONE) 5 MG immediate release tablet Take 1-2 tablets (5-10 mg total) by mouth every 4 (four) hours as needed for moderate pain or severe pain. 84 tablet 0  . pantoprazole (PROTONIX) 40 MG tablet Take 40 mg by mouth daily as needed (acid reflux).     Marland Kitchen PROAIR HFA 108 (90 Base) MCG/ACT inhaler INHALE 2 PUFFS EVERY 6 HOURS AS NEEDED FOR WHEEZING 8.5 g 3  . rivaroxaban (XARELTO) 10 MG TABS tablet Take 1 tablet (10 mg total) by mouth daily with breakfast. Take Xarelto for two and a half more weeks following discharge from the hospital, then discontinue Xarelto. Once the patient has completed the blood thinner regimen, then take a Baby 81 mg Aspirin daily for three more weeks. 20 tablet 0  . traMADol (ULTRAM) 50 MG tablet Take 1-2 tablets (50-100 mg total) by mouth every 6 (six) hours as needed for moderate pain. 56 tablet 0   No facility-administered medications prior to visit.      Per HPI unless specifically indicated in ROS section below Review of Systems     Objective:    BP 108/60 (BP Location: Right Arm, Patient Position: Sitting, Cuff Size: Large)   Pulse 71   Temp 97.8 F (36.6 C) (Oral)   Wt 199 lb (90.3 kg)   SpO2 97%   BMI 31.17 kg/m   Wt  Readings from Last 3 Encounters:  08/29/16 199 lb (90.3 kg)  08/13/16 206 lb (93.4 kg)  08/05/16 206 lb (93.4 kg)    Physical Exam  Constitutional: He appears well-developed and well-nourished. No distress.  Musculoskeletal: He exhibits edema (mild).  R compression stocking in place 2+ DP bilaterally Tender to palpation at R 1st MTPJ with swelling but no erythema or warmth No pain with axial loading or at other metatarsals   Skin: Skin is warm and dry. Rash noted. No erythema.  Pruritic papular rash around trunk predominantly left side No vesicular lesions  Nursing note and vitals reviewed.  Lab Results  Component Value Date   CREATININE 1.02 08/15/2016      Assessment & Plan:   Problem List Items Addressed This  Visit    Gout - Primary    Acute gout flare with R podagra - treat with continued colchicine 0.6mg  BID x 5 days then PRN.  Given recurrent flare this year despite minding diet - will start allopurinol 100mg  daily - once fully resolved from current gout flare. Pt agrees with plan.  Limited with treatment (avoid NSAIDS as on xarelto, avoid prednisone given intolerance hx and recent surgery).       Skin rash    Possible xarelto drug allergy - should improve when he stops xarelto next week.  Treat for now with regular moisturizing cream BID. No systemic symptoms. No signs of shingles          Follow up plan: Return if symptoms worsen or fail to improve.  Ria Bush, MD

## 2016-08-29 NOTE — Patient Instructions (Signed)
For rash - possible drug rash - continue moisturizing cream For gout - treat with colchicine tablet twice daily for next 5 days and then as needed. Once gout fully better, start allopurinol 100mg  daily (gout prevention medicine).  May continue tylenol and tramadol as needed for pain.

## 2016-08-29 NOTE — Assessment & Plan Note (Signed)
Possible xarelto drug allergy - should improve when he stops xarelto next week.  Treat for now with regular moisturizing cream BID. No systemic symptoms. No signs of shingles

## 2016-08-29 NOTE — Assessment & Plan Note (Signed)
Acute gout flare with R podagra - treat with continued colchicine 0.6mg  BID x 5 days then PRN.  Given recurrent flare this year despite minding diet - will start allopurinol 100mg  daily - once fully resolved from current gout flare. Pt agrees with plan.  Limited with treatment (avoid NSAIDS as on xarelto, avoid prednisone given intolerance hx and recent surgery).

## 2016-08-29 NOTE — Progress Notes (Signed)
Pre visit review using our clinic review tool, if applicable. No additional management support is needed unless otherwise documented below in the visit note. 

## 2016-09-16 DIAGNOSIS — Z96641 Presence of right artificial hip joint: Secondary | ICD-10-CM | POA: Diagnosis not present

## 2016-09-16 DIAGNOSIS — Z471 Aftercare following joint replacement surgery: Secondary | ICD-10-CM | POA: Diagnosis not present

## 2016-09-19 ENCOUNTER — Ambulatory Visit (INDEPENDENT_AMBULATORY_CARE_PROVIDER_SITE_OTHER): Payer: Medicare Other | Admitting: Family Medicine

## 2016-09-19 ENCOUNTER — Encounter: Payer: Self-pay | Admitting: Family Medicine

## 2016-09-19 VITALS — BP 122/70 | HR 68 | Temp 97.8°F | Wt 201.2 lb

## 2016-09-19 DIAGNOSIS — M10071 Idiopathic gout, right ankle and foot: Secondary | ICD-10-CM

## 2016-09-19 MED ORDER — KETOROLAC TROMETHAMINE 30 MG/ML IJ SOLN
30.0000 mg | Freq: Once | INTRAMUSCULAR | Status: AC
  Administered 2016-09-19: 30 mg via INTRAMUSCULAR

## 2016-09-19 MED ORDER — INDOMETHACIN 50 MG PO CAPS: 50.0000 mg | ORAL_CAPSULE | Freq: Two times a day (BID) | ORAL | 1 refills | Status: DC | PRN

## 2016-09-19 NOTE — Assessment & Plan Note (Signed)
Acute R podagra ongoing, exacerbated after allopurinol started. Will treat with toradol IM 30mg  today, then start indocin 50mg  BID x 1 wk and then PRN. Continue colchicine 1 tab daily for 1 wk then PRN.  Hold allopurinol for now.  Once gout flare fully resolved, rec retrial allopurinol with concommittant indocin BID for 1 wk whenever started. Pt and wife agree with plan.

## 2016-09-19 NOTE — Progress Notes (Signed)
Pre visit review using our clinic review tool, if applicable. No additional management support is needed unless otherwise documented below in the visit note. 

## 2016-09-19 NOTE — Patient Instructions (Signed)
Hold allopurinol. Decrease colchicine to once daily for 1 week. Start indocin (anti inflammatory) twice daily with meals for 1 week then as needed. Let me know if gout flare not fully better. I want you to wait 1-2 weeks fully better, then may trial allopurinol daily dose - but take with indocin twice daily for 1 week when you start allopurinol.  Let me know if any questions.

## 2016-09-19 NOTE — Addendum Note (Signed)
Addended by: Royann Shivers A on: 09/19/2016 09:31 AM   Modules accepted: Orders

## 2016-09-19 NOTE — Progress Notes (Signed)
BP 122/70   Pulse 68   Temp 97.8 F (36.6 C) (Oral)   Wt 201 lb 4 oz (91.3 kg)   BMI 31.52 kg/m    CC: worsening foot pain Subjective:    Patient ID: [name], male    DOB: 10-04-1945, 71 y.o.   MRN: [id number]  HPI: [name] is a 71 y.o. male presenting on 09/19/2016 for Follow-up (recheck foot-worsening)   See prior note for details. Seen early in the month with acute idiopathic gout of R foot - treated with colchicine BID then PRN.   He did start allopurinol 100mg  daily twice - with recurrent flares each time. Taking colchicine TID. This last flare persistent gout despite colchicine. This last flare some symptoms at left foot.  Prednisone previously caused rash.   Recent R hip replacement 5 wks ago.  Off xarelto.  Lab Results  Component Value Date   CREATININE 1.02 08/15/2016    Relevant past medical, surgical, family and social history reviewed and updated as indicated. Interim medical history since our last visit reviewed. Allergies and medications reviewed and updated. Outpatient Medications Prior to Visit  Medication Sig Dispense Refill  . acetaminophen (TYLENOL) 500 MG tablet Take 1,000 mg by mouth daily as needed for headache.     . allopurinol (ZYLOPRIM) 100 MG tablet Take 1 tablet (100 mg total) by mouth daily. 30 tablet 6  . colchicine 0.6 MG tablet Take 1.2 mg PO, one hour later by another 0.6 mg on first day; then 0.6 mg twice daily until attack resolved.1 (Patient taking differently: Take 0.6 mg by mouth every 8 (eight) hours as needed (gout flares). ) 15 tablet 0  . fluticasone (FLONASE) 50 MCG/ACT nasal spray USE 2 SPRAYS INTO EACH NOSTRIL ONCE DAILY AS DIRECTED (Patient taking differently: USE 2 SPRAYS INTO EACH NOSTRIL ONCE DAILY AS NEEDED FOR CONGESTION) 16 g 9  . pantoprazole (PROTONIX) 40 MG tablet Take 40 mg by mouth daily as needed (acid reflux).     Marland Kitchen PROAIR HFA 108 (90 Base) MCG/ACT inhaler INHALE 2 PUFFS EVERY 6 HOURS AS NEEDED FOR  WHEEZING 8.5 g 3  . traMADol (ULTRAM) 50 MG tablet Take 1-2 tablets (50-100 mg total) by mouth every 6 (six) hours as needed for moderate pain. 56 tablet 0  . oxyCODONE (OXY IR/ROXICODONE) 5 MG immediate release tablet Take 1-2 tablets (5-10 mg total) by mouth every 4 (four) hours as needed for moderate pain or severe pain. (Patient not taking: Reported on 09/19/2016) 84 tablet 0  . methocarbamol (ROBAXIN) 500 MG tablet Take 1 tablet (500 mg total) by mouth every 6 (six) hours as needed for muscle spasms. 80 tablet 0  . rivaroxaban (XARELTO) 10 MG TABS tablet Take 1 tablet (10 mg total) by mouth daily with breakfast. Take Xarelto for two and a half more weeks following discharge from the hospital, then discontinue Xarelto. Once the patient has completed the blood thinner regimen, then take a Baby 81 mg Aspirin daily for three more weeks. 20 tablet 0   No facility-administered medications prior to visit.      Per HPI unless specifically indicated in ROS section below Review of Systems     Objective:    BP 122/70   Pulse 68   Temp 97.8 F (36.6 C) (Oral)   Wt 201 lb 4 oz (91.3 kg)   BMI 31.52 kg/m   Wt Readings from Last 3 Encounters:  09/19/16 201 lb 4 oz (91.3 kg)  08/29/16  199 lb (90.3 kg)  08/13/16 206 lb (93.4 kg)    Physical Exam  Constitutional: He appears well-developed and well-nourished. No distress.  Musculoskeletal: He exhibits edema.  2+ DP bilaterally Acute classic podagra - exquisitely tender to palpation medial R great MTPJ, swelling, mild erythema and warmth. Pain with axial loading  Skin: Skin is warm and dry. No rash noted.  Nursing note and vitals reviewed.      Assessment & Plan:   Problem List Items Addressed This Visit    Gout - Primary    Acute R podagra ongoing, exacerbated after allopurinol started. Will treat with toradol IM 30mg  today, then start indocin 50mg  BID x 1 wk and then PRN. Continue colchicine 1 tab daily for 1 wk then PRN.  Hold  allopurinol for now.  Once gout flare fully resolved, rec retrial allopurinol with concommittant indocin BID for 1 wk whenever started. Pt and wife agree with plan.           Follow up plan: Return if symptoms worsen or fail to improve.  Ria Bush, MD

## 2016-09-30 ENCOUNTER — Encounter: Payer: Self-pay | Admitting: Family Medicine

## 2016-10-21 DIAGNOSIS — Z96641 Presence of right artificial hip joint: Secondary | ICD-10-CM | POA: Diagnosis not present

## 2016-10-21 DIAGNOSIS — Z471 Aftercare following joint replacement surgery: Secondary | ICD-10-CM | POA: Diagnosis not present

## 2016-11-27 IMAGING — DX DG CHEST 2V
2 series · 2 of 2 positions shown · non-contrast
Comparison: 04/26/2014

CLINICAL DATA: Cough.

EXAM:
CHEST  2 VIEW

[w chest pa]
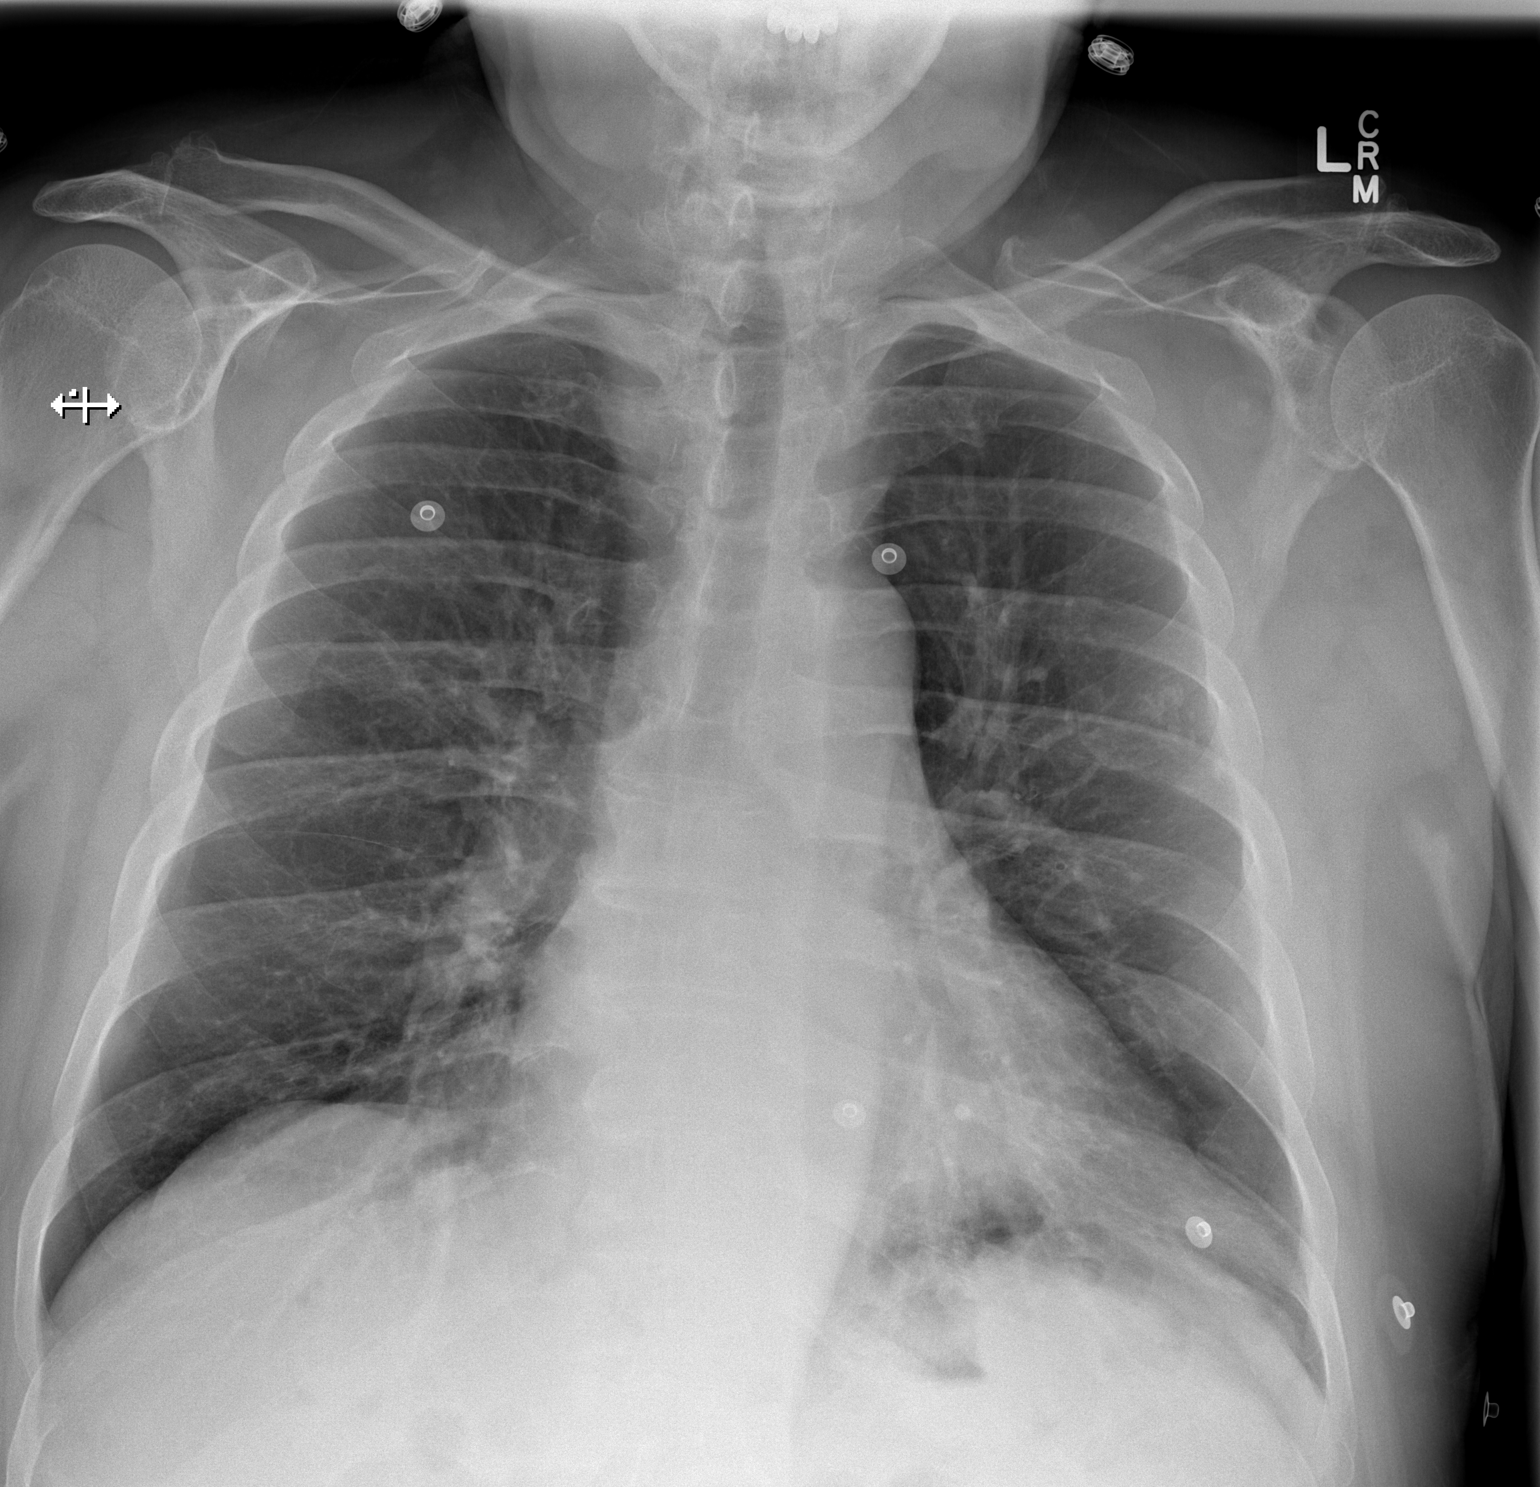

[w chest lat]
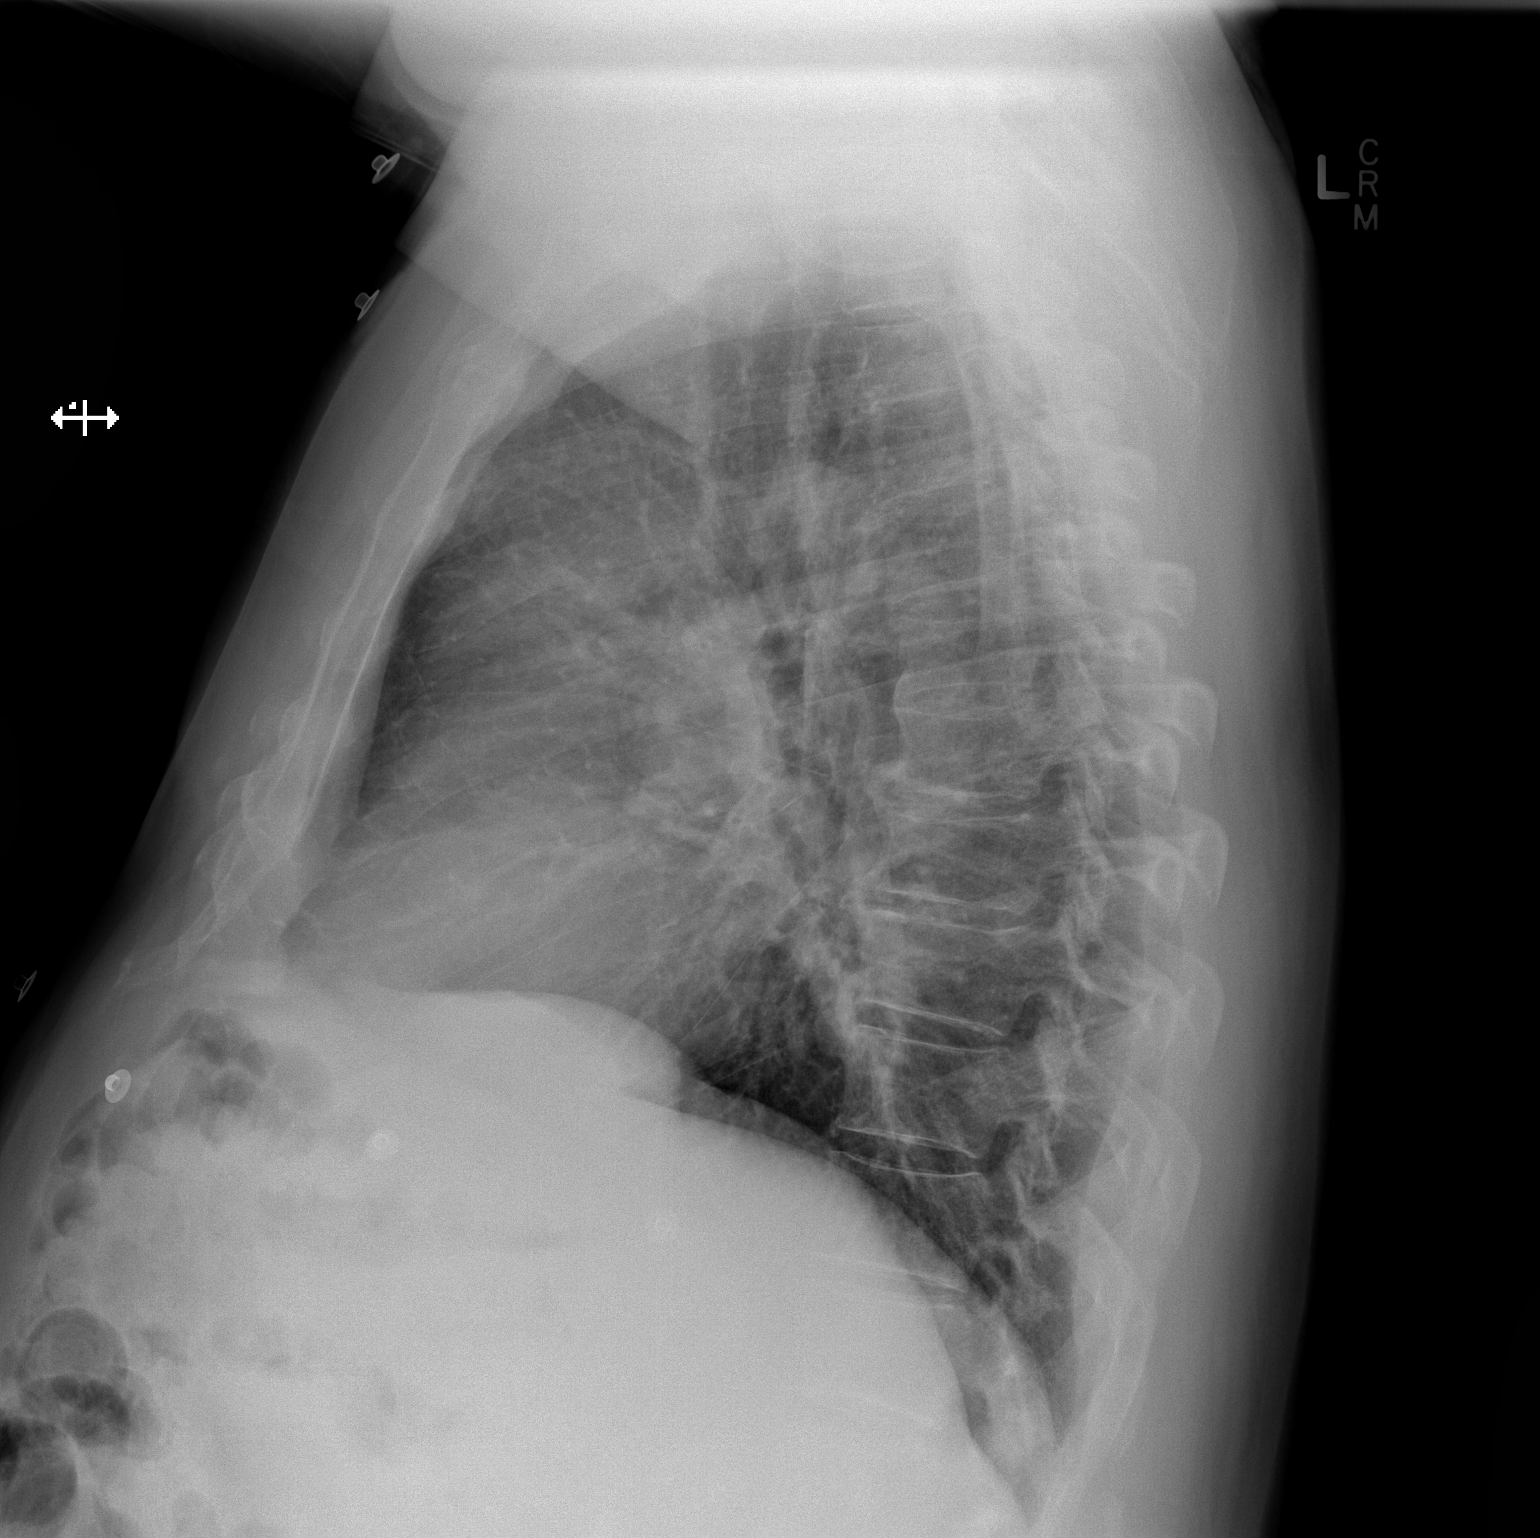

[2 of 2 positions shown; findings below may reference images not displayed]

FINDINGS: The heart size and mediastinal contours are within normal limits.
There is no evidence of pulmonary edema, consolidation,
pneumothorax, nodule or pleural fluid. The visualized skeletal
structures are unremarkable.
IMPRESSION: No active cardiopulmonary disease.

## 2017-01-02 ENCOUNTER — Telehealth: Payer: Self-pay | Admitting: *Deleted

## 2017-01-02 ENCOUNTER — Other Ambulatory Visit: Payer: Self-pay | Admitting: Family Medicine

## 2017-01-02 MED ORDER — SILDENAFIL CITRATE 100 MG PO TABS: 50.0000 mg | ORAL_TABLET | ORAL | 3 refills | Status: DC | PRN

## 2017-01-02 NOTE — Telephone Encounter (Signed)
viagra refilled.  

## 2017-01-02 NOTE — Telephone Encounter (Signed)
Pt is requesting medication refill of viagra. The medication is no longer on his medication list, but pt states he has not d/c. pls advise

## 2017-01-05 ENCOUNTER — Telehealth: Payer: Self-pay | Admitting: *Deleted

## 2017-01-05 MED ORDER — SILDENAFIL CITRATE 20 MG PO TABS: 40.0000 mg | ORAL_TABLET | Freq: Every day | ORAL | 1 refills | Status: DC | PRN

## 2017-01-05 NOTE — Telephone Encounter (Signed)
Generic sildenafil sent in.

## 2017-01-05 NOTE — Telephone Encounter (Signed)
Rx recently sent to pharmacy for sildenafil 100mg . Pt is requesting a new Rx for 20mg  tabs #50, so that he is able to get the $80 deal through the pharmacy. pls advise

## 2017-01-15 DIAGNOSIS — M1611 Unilateral primary osteoarthritis, right hip: Secondary | ICD-10-CM | POA: Diagnosis not present

## 2017-01-15 DIAGNOSIS — Z96641 Presence of right artificial hip joint: Secondary | ICD-10-CM | POA: Diagnosis not present

## 2017-01-15 DIAGNOSIS — Z471 Aftercare following joint replacement surgery: Secondary | ICD-10-CM | POA: Diagnosis not present

## 2017-01-21 ENCOUNTER — Telehealth: Payer: Self-pay | Admitting: Family Medicine

## 2017-01-21 MED ORDER — INDOMETHACIN 25 MG PO CAPS: 25.0000 mg | ORAL_CAPSULE | Freq: Two times a day (BID) | ORAL | 0 refills | Status: DC | PRN

## 2017-01-21 NOTE — Telephone Encounter (Signed)
Spoke to pt and advised per Dr G 

## 2017-01-21 NOTE — Telephone Encounter (Signed)
plz notify patient - insurance will not cover indocin 50mg  unless 25mg  dose not effective. Will change gout PRN med to indocin 25mg  BID PRN flare.  Let us know if recurrent gout flares.

## 2017-02-19 DIAGNOSIS — Z23 Encounter for immunization: Secondary | ICD-10-CM | POA: Diagnosis not present

## 2017-05-14 ENCOUNTER — Other Ambulatory Visit: Payer: Self-pay | Admitting: Family Medicine

## 2017-07-08 ENCOUNTER — Telehealth: Payer: Self-pay | Admitting: Family Medicine

## 2017-07-08 MED ORDER — SILDENAFIL CITRATE 20 MG PO TABS: 40.0000 mg | ORAL_TABLET | Freq: Every day | ORAL | 0 refills | Status: DC | PRN

## 2017-07-08 NOTE — Telephone Encounter (Signed)
Copied from [place]. Topic: Quick Communication - Rx Refill/Question >> Jul 08, 2017  4:54 PM Cecelia Byars, NT wrote: Medication sildenafil (REVATIO) 20 MG tablet Has the patient contacted their pharmacy? (Agent: If no, request that the patient contact the pharmacy for the refill.) Preferred Pharmacy (with phone number or street name)[facility], [place] [phone] (Phone)   Agent: Please be advised that RX refills may take up to 3 business days. We ask that you follow-up with your pharmacy. The Pharmacy called

## 2017-07-10 DIAGNOSIS — Z471 Aftercare following joint replacement surgery: Secondary | ICD-10-CM | POA: Diagnosis not present

## 2017-07-10 DIAGNOSIS — M1611 Unilateral primary osteoarthritis, right hip: Secondary | ICD-10-CM | POA: Diagnosis not present

## 2017-07-10 DIAGNOSIS — Z96641 Presence of right artificial hip joint: Secondary | ICD-10-CM | POA: Diagnosis not present

## 2017-07-15 ENCOUNTER — Other Ambulatory Visit: Payer: Self-pay | Admitting: Family Medicine

## 2017-07-15 DIAGNOSIS — E7800 Pure hypercholesterolemia, unspecified: Secondary | ICD-10-CM

## 2017-07-15 DIAGNOSIS — Z125 Encounter for screening for malignant neoplasm of prostate: Secondary | ICD-10-CM

## 2017-07-15 DIAGNOSIS — M1A0710 Idiopathic chronic gout, right ankle and foot, without tophus (tophi): Secondary | ICD-10-CM

## 2017-07-15 DIAGNOSIS — R7301 Impaired fasting glucose: Secondary | ICD-10-CM

## 2017-07-17 ENCOUNTER — Ambulatory Visit (INDEPENDENT_AMBULATORY_CARE_PROVIDER_SITE_OTHER): Payer: Medicare Other

## 2017-07-17 VITALS — BP 120/78 | HR 79 | Temp 98.0°F | Ht 66.5 in | Wt 200.5 lb

## 2017-07-17 DIAGNOSIS — M1A0710 Idiopathic chronic gout, right ankle and foot, without tophus (tophi): Secondary | ICD-10-CM | POA: Diagnosis not present

## 2017-07-17 DIAGNOSIS — Z125 Encounter for screening for malignant neoplasm of prostate: Secondary | ICD-10-CM

## 2017-07-17 DIAGNOSIS — Z0000 Encounter for general adult medical examination without abnormal findings: Secondary | ICD-10-CM

## 2017-07-17 DIAGNOSIS — E7800 Pure hypercholesterolemia, unspecified: Secondary | ICD-10-CM | POA: Diagnosis not present

## 2017-07-17 DIAGNOSIS — R7301 Impaired fasting glucose: Secondary | ICD-10-CM

## 2017-07-17 LAB — LIPID PANEL
CHOLESTEROL: 202 mg/dL — AB (ref 0–200)
HDL: 46 mg/dL (ref >39.00)
LDL Cholesterol: 140 mg/dL — ABNORMAL HIGH (ref 0–99)
NonHDL: 155.92
TRIGLYCERIDES: 80 mg/dL (ref 0.0–149.0)
Total CHOL/HDL Ratio: 4
VLDL: 16 mg/dL (ref 0.0–40.0)

## 2017-07-17 LAB — COMPREHENSIVE METABOLIC PANEL
ALK PHOS: 82 U/L (ref 39–117)
ALT: 17 U/L (ref 0–53)
AST: 19 U/L (ref 0–37)
Albumin: 4.3 g/dL (ref 3.5–5.2)
BUN: 19 mg/dL (ref 6–23)
CALCIUM: 9.7 mg/dL (ref 8.4–10.5)
CO2: 29 mEq/L (ref 19–32)
Chloride: 106 mEq/L (ref 96–112)
Creatinine, Ser: 0.97 mg/dL (ref 0.40–1.50)
GFR: 80.68 mL/min (ref >60.00)
Glucose, Bld: 107 mg/dL — ABNORMAL HIGH (ref 70–99)
Potassium: 4.3 mEq/L (ref 3.5–5.1)
Sodium: 143 mEq/L (ref 135–145)
TOTAL PROTEIN: 6.9 g/dL (ref 6.0–8.3)
Total Bilirubin: 0.6 mg/dL (ref 0.2–1.2)

## 2017-07-17 LAB — URIC ACID: Uric Acid, Serum: 6.4 mg/dL (ref 4.0–7.8)

## 2017-07-17 LAB — HEMOGLOBIN A1C: Hgb A1c MFr Bld: 5.5 % (ref 4.6–6.5)

## 2017-07-17 LAB — PSA, MEDICARE: PSA: 0.79 ng/ml (ref 0.10–4.00)

## 2017-07-17 NOTE — Patient Instructions (Signed)
[name] , Thank you for taking time to come for your Medicare Wellness Visit. I appreciate your ongoing commitment to your health goals. Please review the following plan we discussed and let me know if I can assist you in the future.   These are the goals we discussed: Goals    . Increase physical activity     Starting 07/17/2017, I will continue to exercise for 60 minutes 6 days per week.        This is a list of the screening recommended for you and due dates:  Health Maintenance  Topic Date Due  . DTaP/Tdap/Td vaccine (1 - Tdap) 07/25/2019*  . Tetanus Vaccine  07/25/2019  . Cologuard (Stool DNA test)  08/08/2019  . Flu Shot  Completed  .  Hepatitis C: One time screening is recommended by Center for Disease Control  (CDC) for  adults born from [birth year] through 1966.   Completed  . Pneumonia vaccines  Completed  *Topic was postponed. The date shown is not the original due date.   Preventive Care for Adults  A healthy lifestyle and preventive care can promote health and wellness. Preventive health guidelines for adults include the following key practices.  . A routine yearly physical is a good way to check with your health care provider about your health and preventive screening. It is a chance to share any concerns and updates on your health and to receive a thorough exam.  . Visit your dentist for a routine exam and preventive care every 6 months. Brush your teeth twice a day and floss once a day. Good oral hygiene prevents tooth decay and gum disease.  . The frequency of eye exams is based on your age, health, family medical history, use  of contact lenses, and other factors. Follow your health care provider's recommendations for frequency of eye exams.  . Eat a healthy diet. Foods like vegetables, fruits, whole grains, low-fat dairy products, and lean protein foods contain the nutrients you need without too many calories. Decrease your intake of foods high in solid fats, added  sugars, and salt. Eat the right amount of calories for you. Get information about a proper diet from your health care provider, if necessary.  . Regular physical exercise is one of the most important things you can do for your health. Most adults should get at least 150 minutes of moderate-intensity exercise (any activity that increases your heart rate and causes you to sweat) each week. In addition, most adults need muscle-strengthening exercises on 2 or more days a week.  Silver Sneakers may be a benefit available to you. To determine eligibility, you may visit the website: www.silversneakers.com or contact program at 865-063-7588 Mon-Fri between 8AM-8PM.   . Maintain a healthy weight. The body mass index (BMI) is a screening tool to identify possible weight problems. It provides an estimate of body fat based on height and weight. Your health care provider can find your BMI and can help you achieve or maintain a healthy weight.   For adults 20 years and older: ? A BMI below 18.5 is considered underweight. ? A BMI of 18.5 to 24.9 is normal. ? A BMI of 25 to 29.9 is considered overweight. ? A BMI of 30 and above is considered obese.   . Maintain normal blood lipids and cholesterol levels by exercising and minimizing your intake of saturated fat. Eat a balanced diet with plenty of fruit and vegetables. Blood tests for lipids and cholesterol should begin at  age 55 and be repeated every 5 years. If your lipid or cholesterol levels are high, you are over 50, or you are at high risk for heart disease, you may need your cholesterol levels checked more frequently. Ongoing high lipid and cholesterol levels should be treated with medicines if diet and exercise are not working.  . If you smoke, find out from your health care provider how to quit. If you do not use tobacco, please do not start.  . If you choose to drink alcohol, please do not consume more than 2 drinks per day. One drink is considered to  be 12 ounces (355 mL) of beer, 5 ounces (148 mL) of wine, or 1.5 ounces (44 mL) of liquor.  . If you are 76-73 years old, ask your health care provider if you should take aspirin to prevent strokes.  . Use sunscreen. Apply sunscreen liberally and repeatedly throughout the day. You should seek shade when your shadow is shorter than you. Protect yourself by wearing long sleeves, pants, a wide-brimmed hat, and sunglasses year round, whenever you are outdoors.  . Once a month, do a whole body skin exam, using a mirror to look at the skin on your back. Tell your health care provider of new moles, moles that have irregular borders, moles that are larger than a pencil eraser, or moles that have changed in shape or color.

## 2017-07-17 NOTE — Progress Notes (Signed)
Subjective:   [name] is a 72 y.o. male who presents for Medicare Annual/Subsequent preventive examination.  Review of Systems:  N/A Cardiac Risk Factors include: advanced age (>53men, >17 women);male gender;obesity (BMI >30kg/m2);dyslipidemia     Objective:    Vitals: BP 120/78 (BP Location: Right Arm, Patient Position: Sitting, Cuff Size: Normal)   Pulse 79   Temp 98 F (36.7 C) (Oral)   Ht 5' 6.5" (1.689 m) Comment: no shoes  Wt 200 lb 8 oz (90.9 kg)   SpO2 99%   BMI 31.88 kg/m   Body mass index is 31.88 kg/m.  Advanced Directives 07/17/2017 08/13/2016 08/05/2016 07/18/2016 07/16/2016 01/02/2016 05/08/2014  Does Patient Have a Medical Advance Directive? Yes Yes Yes Yes Yes Yes Yes  Type of Paramedic of [place];Living will [place];Living will [place];Living will - [place];Living will [place];Living will [place];Living will  Does patient want to make changes to medical advance directive? No - Patient declined No - Patient declined No - Patient declined No - Patient declined - No - Patient declined No - Patient declined  Copy of [place] in Chart? Yes No - copy requested Yes Yes No - copy requested No - copy requested No - copy requested  Pre-existing out of facility DNR order (yellow form or pink MOST form) - - - - - - -    Tobacco Social History   Tobacco Use  Smoking Status Never Smoker  Smokeless Tobacco Never Used     Counseling given: No   Clinical Intake:  Pre-visit preparation completed: Yes  Pain : No/denies pain Pain Score: 0-No pain     Nutritional Status: BMI > 30  Obese Nutritional Risks: Other (Comment) Diabetes: No  How often do you need to have someone help you when you read instructions, pamphlets, or other written materials from your doctor or pharmacy?: 1 - Never What is the last grade  level you completed in school?: 12th grade + technical certificate  Interpreter Needed?: No  Comments: pt lives with spouse Information entered by :: LPinson, LPN  Past Medical History:  Diagnosis Date  . AR (allergic rhinitis)   . Arthritis    PAIN AND OA LEFT KNEE ;  S/P RIGHT TOTAL KNEE REPLACEMENT  . Complication of anesthesia    STATES SEVERE PAIN WAKING UP AFTER GENERAL ANESTHESIA; AND FEELS BAD.  . ED (erectile dysfunction)   . GERD (gastroesophageal reflux disease)   . Gout    per pt  . History of kidney stones   . History of nephrolithiasis    seen [facility]. [facility], last 02/2011  . Hypercholesterolemia   . RBBB (right bundle branch block)    Past Surgical History:  Procedure Laterality Date  . 2D echo  81/19   mild diastolic dysfunction, nl EF   . COLONOSCOPY  05/2006   normal, rpt 10 yrs Amedeo [facility], [place])  . KNEE ARTHROSCOPY Right 2000  . L thumb joint replace Left 2005   GRAMIG   . [place]  . TOTAL HIP ARTHROPLASTY Right 08/13/2016   Procedure: RIGHT TOTAL HIP ARTHROPLASTY ANTERIOR APPROACH;  Surgeon: Gaynelle Arabian, MD;  Location: [facility];  Service: Orthopedics;  Laterality: Right;  . TOTAL KNEE ARTHROPLASTY Right 12/2010  . TOTAL KNEE ARTHROPLASTY Left 05/08/2014   Procedure: LEFT TOTAL KNEE ARTHROPLASTY;  Surgeon: Gearlean Alf, MD;  Location: [facility];  Service: Orthopedics;  Laterality: Left;   Family History  Problem Relation Age of Onset  . Cancer Father        lung and brain (smoker)  . Stroke Paternal Grandmother 78       stroke or heart attack (unsure)  . Cancer Mother        lung (smoker)  . Cancer Maternal Grandmother        leukemia  . CAD Neg Hx        unsure (see above)  . Diabetes Neg Hx    Social History   Socioeconomic History  . Marital status: Married    Spouse name: None  . Number of children: None  . Years of education: None  . Highest education level: None  Social Needs  . Financial resource  strain: None  . Food insecurity - worry: None  . Food insecurity - inability: None  . Transportation needs - medical: None  . Transportation needs - non-medical: None  Occupational History  . None  Tobacco Use  . Smoking status: Never Smoker  . Smokeless tobacco: Never Used  Substance and Sexual Activity  . Alcohol use: No  . Drug use: No  . Sexual activity: Yes  Other Topics Concern  . None  Social History Narrative   Caffeine: occaisonally, 2 diet cokes/day   Married, lives with wife   Retired: Development worker, community   Activity: Walks and runs 3 miles a day. at [place]. Stays active golfing.    Diet: good water, fruits/vegetables daily, avoiding carbs and watching portion sizes    Outpatient Encounter Medications as of 07/17/2017  Medication Sig  . acetaminophen (TYLENOL) 500 MG tablet Take 1,000 mg by mouth daily as needed for headache.   . allopurinol (ZYLOPRIM) 100 MG tablet Take 1 tablet (100 mg total) by mouth daily.  . chlorhexidine (PERIDEX) 0.12 % solution chlorhexidine gluconate 0.12 % mouthwash  . colchicine 0.6 MG tablet Take 1.2 mg PO, one hour later by another 0.6 mg on first day; then 0.6 mg twice daily until attack resolved.1 (Patient taking differently: Take 0.6 mg by mouth every 8 (eight) hours as needed (gout flares). )  . fluticasone (FLONASE) 50 MCG/ACT nasal spray USE 2 SPRAYS INTO EACH NOSTRIL ONCE DAILY AS DIRECTED (Patient taking differently: USE 2 SPRAYS INTO EACH NOSTRIL ONCE DAILY AS NEEDED FOR CONGESTION)  . indomethacin (INDOCIN) 25 MG capsule Take 1 capsule (25 mg total) by mouth 2 (two) times daily as needed (gout flare).  . Multiple Vitamins-Minerals (MULTIVITAMIN ADULT PO) multivitamin  . Naproxen Sodium (ALEVE PO) Take 2 tablets by mouth as needed.  . pantoprazole (PROTONIX) 40 MG tablet Take 40 mg by mouth daily as needed (acid reflux).   . pantoprazole (PROTONIX) 40 MG tablet TAKE 1 TABLET BY MOUTH DAILY  . PROAIR HFA 108 (90 Base) MCG/ACT inhaler INHALE  2 PUFFS EVERY 6 HOURS AS NEEDED FOR WHEEZING  . sildenafil (REVATIO) 20 MG tablet Take 2-5 tablets (40-100 mg total) by mouth daily as needed.  . [DISCONTINUED] oxyCODONE (OXY IR/ROXICODONE) 5 MG immediate release tablet Take 1-2 tablets (5-10 mg total) by mouth every 4 (four) hours as needed for moderate pain or severe pain. (Patient not taking: Reported on 09/19/2016)  . [DISCONTINUED] traMADol (ULTRAM) 50 MG tablet Take 1-2 tablets (50-100 mg total) by mouth every 6 (six) hours as needed for moderate pain.   No facility-administered encounter medications on file as of 07/17/2017.     Activities of Daily Living In your  present state of health, do you have any difficulty performing the following activities: 07/17/2017 08/13/2016  Hearing? Y N  Vision? Y N  Difficulty concentrating or making decisions? Y N  Walking or climbing stairs? N N  Dressing or bathing? N N  Doing errands, shopping? N N  Preparing Food and eating ? N -  Using the Toilet? N -  In the past six months, have you accidently leaked urine? N -  Do you have problems with loss of bowel control? N -  Managing your Medications? N -  Managing your Finances? N -  Housekeeping or managing your Housekeeping? N -  Some recent data might be hidden    Patient Care Team: Ria Bush, MD as PCP - General (Family Medicine) Sharyne Peach, MD as Consulting Physician (Ophthalmology) Gaynelle Arabian, MD as Consulting Physician (Orthopedic Surgery)   Assessment:   This is a routine wellness examination for [place].   Hearing Screening   125Hz  250Hz  500Hz  1000Hz  2000Hz  3000Hz  4000Hz  6000Hz  8000Hz   Right ear:   40 40 40  0    Left ear:   40 40 40  0      Visual Acuity Screening   Right eye Left eye Both eyes  Without correction:     With correction: 20/20 20/20 20/20-1     Exercise Activities and Dietary recommendations Current Exercise Habits: Home exercise routine, Type of exercise: strength  training/weights;stretching;treadmill, Time (Minutes): 60, Frequency (Times/Week): 6, Weekly Exercise (Minutes/Week): 360, Intensity: Moderate, Exercise limited by: None identified  Goals    . Increase physical activity     Starting 07/17/2017, I will continue to exercise for 60 minutes 6 days per week.        Fall Risk Fall Risk  07/17/2017 07/17/2017 07/16/2016 06/29/2015 06/21/2014  Falls in the past year? No No No No No   Depression Screen PHQ 2/9 Scores 07/17/2017 07/17/2017 07/16/2016 06/29/2015  PHQ - 2 Score 2 2 0 0  PHQ- 9 Score 4 4 - -    Cognitive Function MMSE - Mini Mental State Exam 07/17/2017 07/16/2016  Orientation to time 5 5  Orientation to Place 5 5  Registration 3 3  Attention/ Calculation 0 0  Recall 3 3  Language- name 2 objects 0 0  Language- repeat 1 1  Language- follow 3 step command 3 3  Language- read & follow direction 0 0  Write a sentence 0 0  Copy design 0 0  Total score 20 20     PLEASE NOTE: A Mini-Cog screen was completed. Maximum score is 20. A value of 0 denotes this part of [place] MMSE was not completed or the patient failed this part of the Mini-Cog screening.   Mini-Cog Screening Orientation to Time - Max 5 pts Orientation to Place - Max 5 pts Registration - Max 3 pts Recall - Max 3 pts Language Repeat - Max 1 pts Language Follow 3 Step Command - Max 3 pts     Immunization History  Administered Date(s) Administered  . Influenza Whole 02/20/2010, 02/26/2011  . Influenza, High Dose Seasonal PF 02/19/2017  . Influenza,inj,Quad PF,6+ Mos 02/23/2013, 03/22/2014, 03/12/2015, 03/11/2016  . Pneumococcal Conjugate-13 06/21/2014  . Pneumococcal Polysaccharide-23 06/12/2010  . Td 07/24/2009   Screening Tests Health Maintenance  Topic Date Due  . DTaP/Tdap/Td (1 - Tdap) 07/25/2019 (Originally 07/25/2009)  . TETANUS/TDAP  07/25/2019  . Fecal DNA (Cologuard)  08/08/2019  . INFLUENZA VACCINE  Completed  . Hepatitis C Screening  Completed  .  PNA  vac Low Risk Adult  Completed     Plan:   I have personally reviewed, addressed, and noted the following in the patient's chart:  A. Medical and social history B. Use of alcohol, tobacco or illicit drugs  C. Current medications and supplements D. Functional ability and status E.  Nutritional status F.  Physical activity G. Advance directives H. List of other physicians I.  Hospitalizations, surgeries, and ER visits in previous 12 months J.  [place] to include hearing, vision, cognitive, depression L. Referrals and appointments - none  In addition, I have reviewed and discussed with patient certain preventive protocols, quality metrics, and best practice recommendations. A written personalized care plan for preventive services as well as general preventive health recommendations were provided to patient.  See attached scanned questionnaire for additional information.   Signed,   Lindell Noe, MHA, BS, LPN Health Coach

## 2017-07-17 NOTE — Progress Notes (Signed)
PCP notes:   Health maintenance:  No gaps identified.  Abnormal screenings:   Depression score: 4 Depression screen [facility] 2/9 07/17/2017 07/17/2017 07/16/2016 06/29/2015 06/21/2014  Decreased Interest 1 1 0 0 0  Down, Depressed, Hopeless 1 1 0 0 0  PHQ - 2 Score 2 2 0 0 0  Altered sleeping 0 0 - - -  Tired, decreased energy 1 1 - - -  Change in appetite 1 1 - - -  Feeling bad or failure about yourself  0 0 - - -  Trouble concentrating 0 0 - - -  Moving slowly or fidgety/restless 0 0 - - -  Suicidal thoughts 0 0 - - -  PHQ-9 Score 4 4 - - -  Difficult doing work/chores Somewhat difficult Somewhat difficult - - -   Hearing - failed  Hearing Screening   125Hz  250Hz  500Hz  1000Hz  2000Hz  3000Hz  4000Hz  6000Hz  8000Hz   Right ear:   40 40 40  0    Left ear:   40 40 40  0     Patient concerns:   None  Nurse concerns:  None  Next PCP appt:   07/24/17@ 1500

## 2017-07-17 NOTE — Progress Notes (Signed)
Pre visit review using our clinic review tool, if applicable. No additional management support is needed unless otherwise documented below in the visit note. 

## 2017-07-19 NOTE — Progress Notes (Signed)
I reviewed health advisor's note, was available for consultation, and agree with documentation and plan.  

## 2017-07-24 ENCOUNTER — Ambulatory Visit (INDEPENDENT_AMBULATORY_CARE_PROVIDER_SITE_OTHER): Payer: Medicare Other | Admitting: Family Medicine

## 2017-07-24 ENCOUNTER — Encounter: Payer: Self-pay | Admitting: Family Medicine

## 2017-07-24 VITALS — BP 130/70 | HR 66 | Temp 98.5°F | Ht 67.0 in | Wt 206.0 lb

## 2017-07-24 DIAGNOSIS — M1611 Unilateral primary osteoarthritis, right hip: Secondary | ICD-10-CM | POA: Diagnosis not present

## 2017-07-24 DIAGNOSIS — M1A0710 Idiopathic chronic gout, right ankle and foot, without tophus (tophi): Secondary | ICD-10-CM

## 2017-07-24 DIAGNOSIS — E669 Obesity, unspecified: Secondary | ICD-10-CM | POA: Diagnosis not present

## 2017-07-24 DIAGNOSIS — Z7189 Other specified counseling: Secondary | ICD-10-CM

## 2017-07-24 DIAGNOSIS — R011 Cardiac murmur, unspecified: Secondary | ICD-10-CM | POA: Insufficient documentation

## 2017-07-24 DIAGNOSIS — E7800 Pure hypercholesterolemia, unspecified: Secondary | ICD-10-CM

## 2017-07-24 MED ORDER — ALLOPURINOL 100 MG PO TABS: 50.0000 mg | ORAL_TABLET | Freq: Every day | ORAL | 3 refills | Status: DC

## 2017-07-24 MED ORDER — LOVASTATIN 20 MG PO TABS: 20.0000 mg | ORAL_TABLET | Freq: Every day | ORAL | 6 refills | Status: DC

## 2017-07-24 NOTE — Assessment & Plan Note (Addendum)
Stable period on 50mg  allopurinol, PRN rare colchicine. Reviewed low purine diet.

## 2017-07-24 NOTE — Progress Notes (Signed)
BP 130/70 (BP Location: Left Arm, Patient Position: Sitting, Cuff Size: Normal)   Pulse 66   Temp 98.5 F (36.9 C) (Oral)   Ht 5\' 7"  (1.702 m)   Wt 206 lb (93.4 kg)   SpO2 97%   BMI 32.26 kg/m    CC: AMW f/u visit Subjective:    Patient ID: [name], male    DOB: [birth year], 72 y.o.   MRN: [id number]  HPI: [name] is a 72 y.o. male presenting on 07/24/2017 for Annual Exam (Pt 2.)   Saw [name] last week for medicare wellness visit. Note reviewed.   Gout - controlled allopurinol 50mg  daily.  Last March started weight watchers. Asks what idea weight for him is. Recommended BMI of 27.5.   Preventative: Colonoscopy 2008 - normal, rpt 10 yrs [name], [place]). cologuard negative 07/2016 Prostate - checked yearly. Always normal.   Flu 02/2015 Pneumovax 05/2010, prevnar 2016  Td 07/2009 zostavax - did not receive shingrix - discussed Advanced directives - wife [name] then son [name]) would be HCPOA.Brought today, reviewed and scanned 06/2016.  Seat belt use discussed Sunscreen use discussed. No changing moles on skin.  Non smoker Alcohol - none  Caffeine: occaisonally, 2 diet cokes/day Married, lives with wife Retired: Development worker, community Activity: Walks and runs 3 miles a day. at [place]. Stays active golfing.  Diet: good water, fruits/vegetables daily, avoiding carbs and watching portion sizes  Relevant past medical, surgical, family and social history reviewed and updated as indicated. Interim medical history since our last visit reviewed. Allergies and medications reviewed and updated. Outpatient Medications Prior to Visit  Medication Sig Dispense Refill  . acetaminophen (TYLENOL) 500 MG tablet Take 1,000 mg by mouth daily as needed for headache.     . chlorhexidine (PERIDEX) 0.12 % solution chlorhexidine gluconate 0.12 % mouthwash    . colchicine 0.6 MG tablet Take 1.2 mg PO, one hour later by another 0.6 mg on first day; then 0.6 mg twice daily  until attack resolved.1 (Patient taking differently: Take 0.6 mg by mouth every 8 (eight) hours as needed (gout flares). ) 15 tablet 0  . fluticasone (FLONASE) 50 MCG/ACT nasal spray USE 2 SPRAYS INTO EACH NOSTRIL ONCE DAILY AS DIRECTED (Patient taking differently: USE 2 SPRAYS INTO EACH NOSTRIL ONCE DAILY AS NEEDED FOR CONGESTION) 16 g 9  . indomethacin (INDOCIN) 25 MG capsule Take 1 capsule (25 mg total) by mouth 2 (two) times daily as needed (gout flare). 60 capsule 0  . Multiple Vitamins-Minerals (MULTIVITAMIN ADULT PO) multivitamin    . Naproxen Sodium (ALEVE PO) Take 2 tablets by mouth as needed.    . pantoprazole (PROTONIX) 40 MG tablet Take 1 tablet (40 mg total) by mouth daily as needed.    Marland Kitchen PROAIR HFA 108 (90 Base) MCG/ACT inhaler INHALE 2 PUFFS EVERY 6 HOURS AS NEEDED FOR WHEEZING 8.5 g 3  . sildenafil (REVATIO) 20 MG tablet Take 2-5 tablets (40-100 mg total) by mouth daily as needed. 50 tablet 0  . allopurinol (ZYLOPRIM) 100 MG tablet Take 1 tablet (100 mg total) by mouth daily. 30 tablet 6  . pantoprazole (PROTONIX) 40 MG tablet Take 40 mg by mouth daily as needed (acid reflux).     . pantoprazole (PROTONIX) 40 MG tablet TAKE 1 TABLET BY MOUTH DAILY 30 tablet 6   No facility-administered medications prior to visit.      Per HPI unless specifically indicated in ROS section below Review of Systems  Constitutional: Negative  for activity change, appetite change, chills, fatigue, fever and unexpected weight change.  HENT: Negative for hearing loss.   Eyes: Negative for visual disturbance.  Respiratory: Negative for cough, chest tightness, shortness of breath and wheezing.   Cardiovascular: Negative for chest pain, palpitations and leg swelling.  Gastrointestinal: Negative for abdominal distention, abdominal pain, blood in stool, constipation, diarrhea, nausea and vomiting.  Genitourinary: Negative for difficulty urinating and hematuria.  Musculoskeletal: Negative for arthralgias,  myalgias and neck pain.  Skin: Negative for rash.  Neurological: Negative for dizziness, seizures, syncope and headaches.  Hematological: Negative for adenopathy. Does not bruise/bleed easily.  Psychiatric/Behavioral: Negative for dysphoric mood. The patient is not nervous/anxious.        Objective:    BP 130/70 (BP Location: Left Arm, Patient Position: Sitting, Cuff Size: Normal)   Pulse 66   Temp 98.5 F (36.9 C) (Oral)   Ht 5\' 7"  (1.702 m)   Wt 206 lb (93.4 kg)   SpO2 97%   BMI 32.26 kg/m   Wt Readings from Last 3 Encounters:  07/24/17 206 lb (93.4 kg)  07/17/17 200 lb 8 oz (90.9 kg)  09/19/16 201 lb 4 oz (91.3 kg)    Physical Exam  Constitutional: He is oriented to person, place, and time. He appears well-developed and well-nourished. No distress.  HENT:  Head: Normocephalic and atraumatic.  Right Ear: Hearing, tympanic membrane, external ear and ear canal normal.  Left Ear: Hearing, tympanic membrane, external ear and ear canal normal.  Nose: Nose normal.  Mouth/Throat: Uvula is midline, oropharynx is clear and moist and mucous membranes are normal. No oropharyngeal exudate, posterior oropharyngeal edema or posterior oropharyngeal erythema.  Eyes: Conjunctivae and EOM are normal. Pupils are equal, round, and reactive to light. No scleral icterus.  Neck: Normal range of motion. Neck supple. Carotid bruit is not present. No thyromegaly present.  Cardiovascular: Normal rate, regular rhythm and intact distal pulses.  Murmur (mild systolic) heard. Pulses:      Radial pulses are 2+ on the right side, and 2+ on the left side.  Pulmonary/Chest: Effort normal and breath sounds normal. No respiratory distress. He has no wheezes. He has no rales.  Abdominal: Soft. Bowel sounds are normal. He exhibits no distension and no mass. There is no tenderness. There is no rebound and no guarding.  Genitourinary: Rectum normal and prostate normal. Rectal exam shows no external hemorrhoid, no  internal hemorrhoid, no fissure, no mass, no tenderness and anal tone normal. Prostate is not enlarged (20gm) and not tender.  Musculoskeletal: Normal range of motion. He exhibits no edema.  Lymphadenopathy:    He has no cervical adenopathy.  Neurological: He is alert and oriented to person, place, and time.  CN grossly intact, station and gait intact  Skin: Skin is warm and dry. No rash noted.  Psychiatric: He has a normal mood and affect. His behavior is normal. Judgment and thought content normal.  Nursing note and vitals reviewed.  Results for orders placed or performed in visit on 07/17/17  Uric acid  Result Value Ref Range   Uric Acid, Serum 6.4 4.0 - 7.8 mg/dL  PSA, Medicare  Result Value Ref Range   PSA 0.79 0.10 - 4.00 ng/ml  Hemoglobin A1c  Result Value Ref Range   Hgb A1c MFr Bld 5.5 4.6 - 6.5 %  Comprehensive metabolic panel  Result Value Ref Range   Sodium 143 135 - 145 mEq/L   Potassium 4.3 3.5 - 5.1 mEq/L   Chloride  106 96 - 112 mEq/L   CO2 29 19 - 32 mEq/L   Glucose, Bld 107 (H) 70 - 99 mg/dL   BUN 19 6 - 23 mg/dL   Creatinine, Ser 0.97 0.40 - 1.50 mg/dL   Total Bilirubin 0.6 0.2 - 1.2 mg/dL   Alkaline Phosphatase 82 39 - 117 U/L   AST 19 0 - 37 U/L   ALT 17 0 - 53 U/L   Total Protein 6.9 6.0 - 8.3 g/dL   Albumin 4.3 3.5 - 5.2 g/dL   Calcium 9.7 8.4 - 10.5 mg/dL   GFR 80.68 >60.00 mL/min  Lipid panel  Result Value Ref Range   Cholesterol 202 (H) 0 - 200 mg/dL   Triglycerides 80.0 0.0 - 149.0 mg/dL   HDL 46.00 >39.00 mg/dL   VLDL 16.0 0.0 - 40.0 mg/dL   LDL Cholesterol 140 (H) 0 - 99 mg/dL   Total CHOL/HDL Ratio 4    NonHDL 155.92       Assessment & Plan:   Problem List Items Addressed This Visit    Advanced care planning/counseling discussion    Advanced directives - wife [name] then son [name]) would be HCPOA.Brought today, reviewed and scanned 06/2016.       Gout    Stable period on 50mg  allopurinol, PRN rare colchicine.  Reviewed low purine diet.      HYPERCHOLESTEROLEMIA - Primary    Chronic, mildly elevated. He states he's been off cholesterol medication for years. Pravastatin was not tolerated. Agrees to trial lovastatin 20mg  sent to pharmacy. Update with effect The 10-year ASCVD risk score Mikey Bussing DC Jr., et al., 2013) is: 21.9%   Values used to calculate the score:     Age: 73 years     Sex: Male     Is Non-Hispanic African American: No     Diabetic: No     Tobacco smoker: No     Systolic Blood Pressure: 400 mmHg     Is BP treated: No     HDL Cholesterol: 46 mg/dL     Total Cholesterol: 202 mg/dL       Relevant Medications   lovastatin (MEVACOR) 20 MG tablet   OA (osteoarthritis) of hip   Relevant Medications   allopurinol (ZYLOPRIM) 100 MG tablet   Obesity, Class I, BMI 30-34.9    Reviewed healthy diet and lifestyle changes to affect sustainable weight loss. Pt motivated to continue weight watchers. Reviewed goal weight of 175lbs (86.7 BMI)      Systolic murmur    Mild, will monitor for now.           Meds ordered this encounter  Medications  . lovastatin (MEVACOR) 20 MG tablet    Sig: Take 1 tablet (20 mg total) by mouth at bedtime.    Dispense:  30 tablet    Refill:  6  . allopurinol (ZYLOPRIM) 100 MG tablet    Sig: Take 0.5 tablets (50 mg total) by mouth daily.    Dispense:  45 tablet    Refill:  3   No orders of the defined types were placed in this encounter.   Follow up plan: Return in about 1 year (around 07/25/2018) for medicare wellness visit, follow up visit.  Ria Bush, MD

## 2017-07-24 NOTE — Assessment & Plan Note (Signed)
Mild, will monitor for now

## 2017-07-24 NOTE — Patient Instructions (Addendum)
If interested, check with pharmacy about new 2 shot shingles series (shingrix).  Trial lovastatin for cholesterol levels.  Otherwise blood work looking today. Return as needed or in 1 year for next medicare wellness visit with Katha Cabal and follow up with me.   Health Maintenance, Male A healthy lifestyle and preventive care is important for your health and wellness. Ask your health care provider about what schedule of regular examinations is right for you. What should I know about weight and diet? Eat a Healthy Diet  Eat plenty of vegetables, fruits, whole grains, low-fat dairy products, and lean protein.  Do not eat a lot of foods high in solid fats, added sugars, or salt.  Maintain a Healthy Weight Regular exercise can help you achieve or maintain a healthy weight. You should:  Do at least 150 minutes of exercise each week. The exercise should increase your heart rate and make you sweat (moderate-intensity exercise).  Do strength-training exercises at least twice a week.  Watch Your Levels of Cholesterol and Blood Lipids  Have your blood tested for lipids and cholesterol every 5 years starting at 73 years of age. If you are at high risk for heart disease, you should start having your blood tested when you are 73 years old. You may need to have your cholesterol levels checked more often if: ? Your lipid or cholesterol levels are high. ? You are older than 73 years of age. ? You are at high risk for heart disease.  What should I know about cancer screening? Many types of cancers can be detected early and may often be prevented. Lung Cancer  You should be screened every year for lung cancer if: ? You are a current smoker who has smoked for at least 30 years. ? You are a former smoker who has quit within the past 15 years.  Talk to your health care provider about your screening options, when you should start screening, and how often you should be screened.  Colorectal  Cancer  Routine colorectal cancer screening usually begins at 73 years of age and should be repeated every 5-10 years until you are 73 years old. You may need to be screened more often if early forms of precancerous polyps or small growths are found. Your health care provider may recommend screening at an earlier age if you have risk factors for colon cancer.  Your health care provider may recommend using home test kits to check for hidden blood in the stool.  A small camera at the end of a tube can be used to examine your colon (sigmoidoscopy or colonoscopy). This checks for the earliest forms of colorectal cancer.  Prostate and Testicular Cancer  Depending on your age and overall health, your health care provider may do certain tests to screen for prostate and testicular cancer.  Talk to your health care provider about any symptoms or concerns you have about testicular or prostate cancer.  Skin Cancer  Check your skin from head to toe regularly.  Tell your health care provider about any new moles or changes in moles, especially if: ? There is a change in a mole's size, shape, or color. ? You have a mole that is larger than a pencil eraser.  Always use sunscreen. Apply sunscreen liberally and repeat throughout the day.  Protect yourself by wearing long sleeves, pants, a wide-brimmed hat, and sunglasses when outside.  What should I know about heart disease, diabetes, and high blood pressure?  If you are 18-39 years  of age, have your blood pressure checked every 3-5 years. If you are 65 years of age or older, have your blood pressure checked every year. You should have your blood pressure measured twice-once when you are at a hospital or clinic, and once when you are not at a hospital or clinic. Record the average of the two measurements. To check your blood pressure when you are not at a hospital or clinic, you can use: ? An automated blood pressure machine at a pharmacy. ? A home blood  pressure monitor.  Talk to your health care provider about your target blood pressure.  If you are between 76-33 years old, ask your health care provider if you should take aspirin to prevent heart disease.  Have regular diabetes screenings by checking your fasting blood sugar level. ? If you are at a normal weight and have a low risk for diabetes, have this test once every three years after the age of 79. ? If you are overweight and have a high risk for diabetes, consider being tested at a younger age or more often.  A one-time screening for abdominal aortic aneurysm (AAA) by ultrasound is recommended for men aged 28-75 years who are current or former smokers. What should I know about preventing infection? Hepatitis B If you have a higher risk for hepatitis B, you should be screened for this virus. Talk with your health care provider to find out if you are at risk for hepatitis B infection. Hepatitis C Blood testing is recommended for:  Everyone born from [birth year] through 1966.  Anyone with known risk factors for hepatitis C.  Sexually Transmitted Diseases (STDs)  You should be screened each year for STDs including gonorrhea and chlamydia if: ? You are sexually active and are younger than 73 years of age. ? You are older than 73 years of age and your health care provider tells you that you are at risk for this type of infection. ? Your sexual activity has changed since you were last screened and you are at an increased risk for chlamydia or gonorrhea. Ask your health care provider if you are at risk.  Talk with your health care provider about whether you are at high risk of being infected with HIV. Your health care provider may recommend a prescription medicine to help prevent HIV infection.  What else can I do?  Schedule regular health, dental, and eye exams.  Stay current with your vaccines (immunizations).  Do not use any tobacco products, such as cigarettes, chewing tobacco, and  e-cigarettes. If you need help quitting, ask your health care provider.  Limit alcohol intake to no more than 2 drinks per day. One drink equals 12 ounces of beer, 5 ounces of wine, or 1 ounces of hard liquor.  Do not use street drugs.  Do not share needles.  Ask your health care provider for help if you need support or information about quitting drugs.  Tell your health care provider if you often feel depressed.  Tell your health care provider if you have ever been abused or do not feel safe at home. This information is not intended to replace advice given to you by your health care provider. Make sure you discuss any questions you have with your health care provider. Document Released: 11/08/2007 Document Revised: 01/09/2016 Document Reviewed: 02/13/2015 Elsevier Interactive Patient Education  [employer].

## 2017-07-24 NOTE — Assessment & Plan Note (Signed)
Reviewed healthy diet and lifestyle changes to affect sustainable weight loss. Pt motivated to continue weight watchers. Reviewed goal weight of 175lbs (27.5 BMI)

## 2017-07-24 NOTE — Assessment & Plan Note (Signed)
Advanced directives - wife Jessamine then son Mostafa Yuan) would be HCPOA.Brought today, reviewed and scanned 06/2016.

## 2017-07-24 NOTE — Assessment & Plan Note (Addendum)
Chronic, mildly elevated. He states he's been off cholesterol medication for years. Pravastatin was not tolerated. Agrees to trial lovastatin 20mg  sent to pharmacy. Update with effect The 10-year ASCVD risk score Mikey Bussing DC Jr., et al., 2013) is: 21.9%   Values used to calculate the score:     Age: 72 years     Sex: Male     Is Non-Hispanic African American: No     Diabetic: No     Tobacco smoker: No     Systolic Blood Pressure: 017 mmHg     Is BP treated: No     HDL Cholesterol: 46 mg/dL     Total Cholesterol: 202 mg/dL

## 2017-08-03 DIAGNOSIS — H02834 Dermatochalasis of left upper eyelid: Secondary | ICD-10-CM | POA: Diagnosis not present

## 2017-08-03 DIAGNOSIS — H02831 Dermatochalasis of right upper eyelid: Secondary | ICD-10-CM | POA: Diagnosis not present

## 2017-08-04 ENCOUNTER — Other Ambulatory Visit: Payer: Self-pay | Admitting: Family Medicine

## 2017-08-04 MED ORDER — FLUTICASONE PROPIONATE 50 MCG/ACT NA SUSP: NASAL | 9 refills | Status: DC

## 2017-08-04 NOTE — Telephone Encounter (Signed)
Getting a allergy alert to prednisone with this Flonase refill request.  Pt of Dr. Danise Mina  [id number] in [place], [place]

## 2017-08-04 NOTE — Telephone Encounter (Signed)
Copied from [place] [id number]. Topic: Quick Communication - Rx Refill/Question >> Aug 04, 2017 10:28 AM Aurelio Brash B wrote: Medication: fluticasone (FLONASE) 50 MCG/ACT nasal spray   Has the patient contacted their pharmacy? no    (Agent: If no, request that the patient contact the pharmacy for the refill.)   Preferred Pharmacy (with phone number or street name): [id number] [address], [place] [phone] (Phone) [phone] (Fax)     Agent: Please be advised that RX refills may take up to 3 business days. We ask that you follow-up with your pharmacy.

## 2017-08-05 NOTE — Telephone Encounter (Signed)
Notified pt flonase was sent to [place]. Pt voiced understanding.

## 2017-10-13 ENCOUNTER — Other Ambulatory Visit: Payer: Self-pay | Admitting: Family Medicine

## 2017-10-13 ENCOUNTER — Telehealth: Payer: Self-pay | Admitting: Family Medicine

## 2017-10-13 MED ORDER — SILDENAFIL CITRATE 20 MG PO TABS: 40.0000 mg | ORAL_TABLET | Freq: Every day | ORAL | 0 refills | Status: DC | PRN

## 2017-10-13 NOTE — Telephone Encounter (Signed)
Copied from [place] [id number]. Topic: Quick Communication - Rx Refill/Question >> Oct 13, 2017  9:17 AM Margot Ables wrote: Medication: sildenafil (REVATIO) 20 MG tablet - pt has 10 left Has the patient contacted their pharmacy? Yes - pharmacy advised to call MD Preferred Pharmacy (with phone number or street name): [id number] [address], [place] [phone] (Phone) [phone] (Fax)

## 2018-01-10 ENCOUNTER — Other Ambulatory Visit: Payer: Self-pay | Admitting: Family Medicine

## 2018-04-26 ENCOUNTER — Telehealth: Payer: Self-pay | Admitting: Family Medicine

## 2018-04-26 NOTE — Telephone Encounter (Signed)
Pt came into office to request a refill on 8.5 proair HFA 90 mcg/inh. [facility] [address] [facility]

## 2018-04-27 MED ORDER — ALBUTEROL SULFATE HFA 108 (90 BASE) MCG/ACT IN AERS: 2.0000 | INHALATION_SPRAY | Freq: Four times a day (QID) | RESPIRATORY_TRACT | 3 refills | Status: DC | PRN

## 2018-04-27 NOTE — Telephone Encounter (Signed)
Escribed

## 2018-05-11 ENCOUNTER — Other Ambulatory Visit: Payer: Self-pay | Admitting: Family Medicine

## 2018-05-11 NOTE — Telephone Encounter (Signed)
Electronic refill request Lovastatin Last office visit 07/24/17 Last refill 07/24/17 #30/6 See drug warning with Colchicine

## 2018-05-16 DIAGNOSIS — R05 Cough: Secondary | ICD-10-CM | POA: Diagnosis not present

## 2018-05-16 DIAGNOSIS — J209 Acute bronchitis, unspecified: Secondary | ICD-10-CM | POA: Diagnosis not present

## 2018-06-04 ENCOUNTER — Other Ambulatory Visit: Payer: Self-pay | Admitting: Family Medicine

## 2018-06-23 ENCOUNTER — Other Ambulatory Visit: Payer: Self-pay | Admitting: Family Medicine

## 2018-06-23 NOTE — Telephone Encounter (Signed)
Electronic refill request Sildenafil Last refill 01/11/18 #45/1 Last office visit 07/24/17 Upcoming appointment 07/28/18

## 2018-07-22 ENCOUNTER — Other Ambulatory Visit: Payer: Self-pay | Admitting: Family Medicine

## 2018-07-22 DIAGNOSIS — D649 Anemia, unspecified: Secondary | ICD-10-CM

## 2018-07-22 DIAGNOSIS — E7800 Pure hypercholesterolemia, unspecified: Secondary | ICD-10-CM

## 2018-07-22 DIAGNOSIS — R7301 Impaired fasting glucose: Secondary | ICD-10-CM

## 2018-07-22 DIAGNOSIS — Z125 Encounter for screening for malignant neoplasm of prostate: Secondary | ICD-10-CM

## 2018-07-22 DIAGNOSIS — M1A0710 Idiopathic chronic gout, right ankle and foot, without tophus (tophi): Secondary | ICD-10-CM

## 2018-07-23 ENCOUNTER — Ambulatory Visit: Payer: Medicare Other

## 2018-07-23 ENCOUNTER — Ambulatory Visit (INDEPENDENT_AMBULATORY_CARE_PROVIDER_SITE_OTHER): Payer: Medicare Other

## 2018-07-23 VITALS — BP 124/86 | HR 61 | Temp 98.1°F | Ht 67.5 in | Wt 209.6 lb

## 2018-07-23 DIAGNOSIS — Z0000 Encounter for general adult medical examination without abnormal findings: Secondary | ICD-10-CM | POA: Diagnosis not present

## 2018-07-23 DIAGNOSIS — D649 Anemia, unspecified: Secondary | ICD-10-CM

## 2018-07-23 DIAGNOSIS — R7301 Impaired fasting glucose: Secondary | ICD-10-CM

## 2018-07-23 DIAGNOSIS — M1A0710 Idiopathic chronic gout, right ankle and foot, without tophus (tophi): Secondary | ICD-10-CM | POA: Diagnosis not present

## 2018-07-23 DIAGNOSIS — E7800 Pure hypercholesterolemia, unspecified: Secondary | ICD-10-CM

## 2018-07-23 DIAGNOSIS — Z125 Encounter for screening for malignant neoplasm of prostate: Secondary | ICD-10-CM | POA: Diagnosis not present

## 2018-07-23 LAB — CBC WITH DIFFERENTIAL/PLATELET
Basophils Absolute: 0 10*3/uL (ref 0.0–0.1)
Basophils Relative: 0.7 % (ref 0.0–3.0)
EOS PCT: 6.5 % — AB (ref 0.0–5.0)
Eosinophils Absolute: 0.4 10*3/uL (ref 0.0–0.7)
HEMATOCRIT: 42 % (ref 39.0–52.0)
Hemoglobin: 14.5 g/dL (ref 13.0–17.0)
Lymphocytes Relative: 29.6 % (ref 12.0–46.0)
Lymphs Abs: 1.7 10*3/uL (ref 0.7–4.0)
MCHC: 34.4 g/dL (ref 30.0–36.0)
MCV: 89.4 fl (ref 78.0–100.0)
MONOS PCT: 9.4 % (ref 3.0–12.0)
Monocytes Absolute: 0.5 10*3/uL (ref 0.1–1.0)
Neutro Abs: 3.1 10*3/uL (ref 1.4–7.7)
Neutrophils Relative %: 53.8 % (ref 43.0–77.0)
Platelets: 266 10*3/uL (ref 150.0–400.0)
RBC: 4.7 Mil/uL (ref 4.22–5.81)
RDW: 13.3 % (ref 11.5–15.5)
WBC: 5.7 10*3/uL (ref 4.0–10.5)

## 2018-07-23 LAB — COMPREHENSIVE METABOLIC PANEL
ALT: 22 U/L (ref 0–53)
AST: 26 U/L (ref 0–37)
Albumin: 4.5 g/dL (ref 3.5–5.2)
Alkaline Phosphatase: 80 U/L (ref 39–117)
BILIRUBIN TOTAL: 0.7 mg/dL (ref 0.2–1.2)
BUN: 20 mg/dL (ref 6–23)
CO2: 27 mEq/L (ref 19–32)
CREATININE: 1.04 mg/dL (ref 0.40–1.50)
Calcium: 9.3 mg/dL (ref 8.4–10.5)
Chloride: 105 mEq/L (ref 96–112)
GFR: 69.84 mL/min (ref >60.00)
Glucose, Bld: 99 mg/dL (ref 70–99)
Potassium: 4.1 mEq/L (ref 3.5–5.1)
Sodium: 141 mEq/L (ref 135–145)
Total Protein: 6.8 g/dL (ref 6.0–8.3)

## 2018-07-23 LAB — URIC ACID: Uric Acid, Serum: 7.4 mg/dL (ref 4.0–7.8)

## 2018-07-23 LAB — LIPID PANEL
Cholesterol: 177 mg/dL (ref 0–200)
HDL: 45.2 mg/dL (ref >39.00)
LDL Cholesterol: 108 mg/dL — ABNORMAL HIGH (ref 0–99)
NonHDL: 131.73
Total CHOL/HDL Ratio: 4
Triglycerides: 118 mg/dL (ref 0.0–149.0)
VLDL: 23.6 mg/dL (ref 0.0–40.0)

## 2018-07-23 LAB — TSH: TSH: 2.4 u[IU]/mL (ref 0.35–4.50)

## 2018-07-23 LAB — HEMOGLOBIN A1C: Hgb A1c MFr Bld: 5.5 % (ref 4.6–6.5)

## 2018-07-23 LAB — PSA, MEDICARE: PSA: 1.16 ng/ml (ref 0.10–4.00)

## 2018-07-23 NOTE — Progress Notes (Signed)
PCP notes:   Health maintenance:  No gaps identified.  Abnormal screenings:   Depression score: 8 Depression screen [facility] 2/9 07/23/2018 07/17/2017 07/17/2017 07/16/2016 06/29/2015  Decreased Interest 2 1 1  0 0  Down, Depressed, Hopeless 2 1 1  0 0  PHQ - 2 Score 4 2 2  0 0  Altered sleeping 1 0 0 - -  Tired, decreased energy 0 1 0 - -  Change in appetite 0 1 1 - -  Feeling bad or failure about yourself  1 0 1 - -  Trouble concentrating 1 0 0 - -  Moving slowly or fidgety/restless 1 0 0 - -  Suicidal thoughts 0 0 0 - -  PHQ-9 Score 8 4 4  - -  Difficult doing work/chores Somewhat difficult Somewhat difficult Somewhat difficult - -    Patient concerns:   None  Nurse concerns:  None  Next PCP appt:   07/28/18 @ 1030

## 2018-07-23 NOTE — Progress Notes (Signed)
Subjective:   [name] is a 73 y.o. male who presents for Medicare Annual/Subsequent preventive examination.  Review of Systems:  N/A Cardiac Risk Factors include: advanced age (>78men, >48 women);male gender;dyslipidemia;obesity (BMI >30kg/m2)     Objective:    Vitals: BP 124/86 (BP Location: Right Arm, Patient Position: Sitting, Cuff Size: Normal)   Pulse 61   Temp 98.1 F (36.7 C) (Oral)   Ht 5' 7.5" (1.715 m) Comment: shoes  Wt 209 lb 9.6 oz (95.1 kg)   SpO2 94%   BMI 32.34 kg/m   Body mass index is 32.34 kg/m.  Advanced Directives 07/23/2018 07/17/2017 08/13/2016 08/05/2016 07/18/2016 07/16/2016 01/02/2016  Does Patient Have a Medical Advance Directive? Yes Yes Yes Yes Yes Yes Yes  Type of Paramedic of [place];Living will [place];Living will [place];Living will [place];Living will - [place];Living will [place];Living will  Does patient want to make changes to medical advance directive? - No - Patient declined No - Patient declined No - Patient declined No - Patient declined - No - Patient declined  Copy of [place] in Chart? No - copy requested Yes No - copy requested Yes Yes No - copy requested No - copy requested  Pre-existing out of facility DNR order (yellow form or pink MOST form) - - - - - - -    Tobacco Social History   Tobacco Use  Smoking Status Never Smoker  Smokeless Tobacco Never Used     Counseling given: No   Clinical Intake:  Pre-visit preparation completed: Yes  Pain : No/denies pain Pain Score: 0-No pain     Nutritional Status: BMI > 30  Obese Nutritional Risks: None Diabetes: No  How often do you need to have someone help you when you read instructions, pamphlets, or other written materials from your doctor or pharmacy?: 1 - Never  Interpreter Needed?: No  Comments: pt lives with  spouse Information entered by :: LPinson, LPN  Past Medical History:  Diagnosis Date  . AR (allergic rhinitis)   . Arthritis    PAIN AND OA LEFT KNEE ;  S/P RIGHT TOTAL KNEE REPLACEMENT  . Complication of anesthesia    STATES SEVERE PAIN WAKING UP AFTER GENERAL ANESTHESIA; AND FEELS BAD.  . ED (erectile dysfunction)   . GERD (gastroesophageal reflux disease)   . Gout    per pt  . History of kidney stones   . History of nephrolithiasis    seen Dr. [facility], last 02/2011  . Hypercholesterolemia   . RBBB (right bundle branch block)    Past Surgical History:  Procedure Laterality Date  . 2D echo  76/16   mild diastolic dysfunction, nl EF   . COLONOSCOPY  05/2006   normal, rpt 10 yrs [facility], [place])  . KNEE ARTHROSCOPY Right 2000  . L thumb joint replace Left 2005   GRAMIG   . [place]  . TOTAL HIP ARTHROPLASTY Right 08/13/2016   Procedure: RIGHT TOTAL HIP ARTHROPLASTY ANTERIOR APPROACH;  Surgeon: Gaynelle Arabian, MD;  Location: [facility];  Service: Orthopedics;  Laterality: Right;  . TOTAL KNEE ARTHROPLASTY Right 12/2010  . TOTAL KNEE ARTHROPLASTY Left 05/08/2014   Procedure: LEFT TOTAL KNEE ARTHROPLASTY;  Surgeon: Gearlean Alf, MD;  Location: [facility];  Service: Orthopedics;  Laterality: Left;   Family History  Problem Relation Age of Onset  . Cancer Father  lung and brain (smoker)  . Stroke Paternal Grandmother 78       stroke or heart attack (unsure)  . Cancer Mother        lung (smoker)  . Cancer Maternal Grandmother        leukemia  . CAD Neg Hx        unsure (see above)  . Diabetes Neg Hx    Social History   Socioeconomic History  . Marital status: Married    Spouse name: Not on file  . Number of children: Not on file  . Years of education: Not on file  . Highest education level: Not on file  Occupational History  . Not on file  Social Needs  . Financial resource strain: Not on file  . Food insecurity:    Worry: Not on  file    Inability: Not on file  . Transportation needs:    Medical: Not on file    Non-medical: Not on file  Tobacco Use  . Smoking status: Never Smoker  . Smokeless tobacco: Never Used  Substance and Sexual Activity  . Alcohol use: No  . Drug use: No  . Sexual activity: Yes  Lifestyle  . Physical activity:    Days per week: Not on file    Minutes per session: Not on file  . Stress: Not on file  Relationships  . Social connections:    Talks on phone: Not on file    Gets together: Not on file    Attends religious service: Not on file    Active member of club or organization: Not on file    Attends meetings of clubs or organizations: Not on file    Relationship status: Not on file  Other Topics Concern  . Not on file  Social History Narrative   Caffeine: occaisonally, 2 diet cokes/day   Married, lives with wife   Retired: Development worker, community   Activity: Walks and runs 3 miles a day. at [place]. Stays active golfing.    Diet: good water, fruits/vegetables daily, avoiding carbs and watching portion sizes    Outpatient Encounter Medications as of 07/23/2018  Medication Sig  . acetaminophen (TYLENOL) 500 MG tablet Take 1,000 mg by mouth daily as needed for headache.   . albuterol (PROAIR HFA) 108 (90 Base) MCG/ACT inhaler Inhale 2 puffs into the lungs every 6 (six) hours as needed. for wheezing  . allopurinol (ZYLOPRIM) 100 MG tablet TAKE 1 TABLET BY MOUTH DAILY *START AFTER GOUT FLARE HAS RESOLVED*  . chlorhexidine (PERIDEX) 0.12 % solution chlorhexidine gluconate 0.12 % mouthwash  . colchicine 0.6 MG tablet Take 1.2 mg PO, one hour later by another 0.6 mg on first day; then 0.6 mg twice daily until attack resolved.1 (Patient taking differently: Take 0.6 mg by mouth every 8 (eight) hours as needed (gout flares). )  . fluticasone (FLONASE) 50 MCG/ACT nasal spray USE 2 SPRAYS INTO EACH NOSTRIL ONCE DAILY AS NEEDED FOR CONGESTION  . indomethacin (INDOCIN) 25 MG capsule Take 1 capsule (25 mg  total) by mouth 2 (two) times daily as needed (gout flare).  Marland Kitchen lovastatin (MEVACOR) 20 MG tablet TAKE 1 TABLET BY MOUTH EVERYDAY AT BEDTIME  . Multiple Vitamins-Minerals (MULTIVITAMIN ADULT PO) multivitamin  . Naproxen Sodium (ALEVE PO) Take 2 tablets by mouth as needed.  . pantoprazole (PROTONIX) 40 MG tablet TAKE 1 TABLET BY MOUTH DAILY  . sertraline (ZOLOFT) 100 MG tablet Take 100 mg by mouth daily.  . sildenafil (REVATIO) 20 MG  tablet TAKE 2-5 TABLETS (40-100 MG TOTAL) BY MOUTH DAILY AS NEEDED.   No facility-administered encounter medications on file as of 07/23/2018.     Activities of Daily Living In your present state of health, do you have any difficulty performing the following activities: 07/23/2018  Hearing? Y  Vision? N  Difficulty concentrating or making decisions? N  Walking or climbing stairs? N  Dressing or bathing? N  Doing errands, shopping? N  Preparing Food and eating ? N  Using the Toilet? N  In the past six months, have you accidently leaked urine? N  Do you have problems with loss of bowel control? N  Managing your Medications? N  Managing your Finances? N  Housekeeping or managing your Housekeeping? N  Some recent data might be hidden    Patient Care Team: Ria Bush, MD as PCP - General (Family Medicine) Sharyne Peach, MD as Consulting Physician (Ophthalmology) Gaynelle Arabian, MD as Consulting Physician (Orthopedic Surgery)   Assessment:   This is a routine wellness examination for [place].   Visual Acuity Screening   Right eye Left eye Both eyes  Without correction: 20/25 20/25 20/25   With correction:     Hearing Screening Comments: Bilateral hearing aids   Exercise Activities and Dietary recommendations Current Exercise Habits: Home exercise routine, Type of exercise: Other - see comments;strength training/weights;walking(elliptical machine 3 miles; walking 3.5 miles), Time (Minutes): > 60, Frequency (Times/Week): 6, Weekly Exercise  (Minutes/Week): 0, Intensity: Moderate, Exercise limited by: None identified  Goals    . Increase physical activity     Starting 07/23/2018, I will continue to exercise for 90 minutes 6 days per week.        Fall Risk Fall Risk  07/23/2018 07/17/2017 07/17/2017 07/16/2016 06/29/2015  Falls in the past year? 0 No No No No   Depression Screen PHQ 2/9 Scores 07/23/2018 07/17/2017 07/17/2017 07/16/2016  PHQ - 2 Score 4 2 2  0  PHQ- 9 Score 8 4 4  -    Cognitive Function MMSE - Mini Mental State Exam 07/23/2018 07/17/2017 07/16/2016  Orientation to time 5 5 5   Orientation to Place 5 5 5   Registration 3 3 3   Attention/ Calculation 0 0 0  Recall 3 3 3   Language- name 2 objects 0 0 0  Language- repeat 1 1 1   Language- follow 3 step command 3 3 3   Language- read & follow direction 0 0 0  Write a sentence 0 0 0  Copy design 0 0 0  Total score 20 20 20    PLEASE NOTE: A Mini-Cog screen was completed. Maximum score is 20. A value of 0 denotes this part of [place] MMSE was not completed or the patient failed this part of the Mini-Cog screening.   Mini-Cog Screening Orientation to Time - Max 5 pts Orientation to Place - Max 5 pts Registration - Max 3 pts Recall - Max 3 pts Language Repeat - Max 1 pts Language Follow 3 Step Command - Max 3 pts      Immunization History  Administered Date(s) Administered  . Influenza Whole 02/20/2010, 02/26/2011  . Influenza, High Dose Seasonal PF 02/19/2017, 02/23/2018  . Influenza,inj,Quad PF,6+ Mos 02/23/2013, 03/22/2014, 03/12/2015, 03/11/2016  . Pneumococcal Conjugate-13 06/21/2014  . Pneumococcal Polysaccharide-23 06/12/2010  . Td 07/24/2009    Screening Tests Health Maintenance  Topic Date Due  . DTaP/Tdap/Td (1 - Tdap) 07/25/2019 (Originally 09/23/1956)  . TETANUS/TDAP  07/25/2019  . Fecal DNA (Cologuard)  08/08/2019  . INFLUENZA VACCINE  Completed  .  Hepatitis C Screening  Completed  . PNA vac Low Risk Adult  Completed       Plan:    I  have personally reviewed, addressed, and noted the following in the patient's chart:  A. Medical and social history B. Use of alcohol, tobacco or illicit drugs  C. Current medications and supplements D. Functional ability and status E.  Nutritional status F.  Physical activity G. Advance directives H. List of other physicians I.  Hospitalizations, surgeries, and ER visits in previous 12 months J.  [place] to include hearing, vision, cognitive, depression L. Referrals and appointments - none  In addition, I have reviewed and discussed with patient certain preventive protocols, quality metrics, and best practice recommendations. A written personalized care plan for preventive services as well as general preventive health recommendations were provided to patient.  See attached scanned questionnaire for additional information.   Signed,   Lindell Noe, MHA, BS, LPN Health Coach

## 2018-07-23 NOTE — Patient Instructions (Addendum)
[name] , Thank you for taking time to come for your Medicare Wellness Visit. I appreciate your ongoing commitment to your health goals. Please review the following plan we discussed and let me know if I can assist you in the future.   These are the goals we discussed: Goals    . Increase physical activity     Starting 07/23/2018, I will continue to exercise for 90 minutes 6 days per week.        This is a list of the screening recommended for you and due dates:  Health Maintenance  Topic Date Due  . DTaP/Tdap/Td vaccine (1 - Tdap) 07/25/2019*  . Tetanus Vaccine  07/25/2019  . Cologuard (Stool DNA test)  08/08/2019  . Flu Shot  Completed  .  Hepatitis C: One time screening is recommended by Center for Disease Control  (CDC) for  adults born from [birth year] through 1966.   Completed  . Pneumonia vaccines  Completed  *Topic was postponed. The date shown is not the original due date.   Preventive Care for Adults  A healthy lifestyle and preventive care can promote health and wellness. Preventive health guidelines for adults include the following key practices.  . A routine yearly physical is a good way to check with your health care provider about your health and preventive screening. It is a chance to share any concerns and updates on your health and to receive a thorough exam.  . Visit your dentist for a routine exam and preventive care every 6 months. Brush your teeth twice a day and floss once a day. Good oral hygiene prevents tooth decay and gum disease.  . The frequency of eye exams is based on your age, health, family medical history, use  of contact lenses, and other factors. Follow your health care provider's recommendations for frequency of eye exams.  . Eat a healthy diet. Foods like vegetables, fruits, whole grains, low-fat dairy products, and lean protein foods contain the nutrients you need without too many calories. Decrease your intake of foods high in solid fats, added  sugars, and salt. Eat the right amount of calories for you. Get information about a proper diet from your health care provider, if necessary.  . Regular physical exercise is one of the most important things you can do for your health. Most adults should get at least 150 minutes of moderate-intensity exercise (any activity that increases your heart rate and causes you to sweat) each week. In addition, most adults need muscle-strengthening exercises on 2 or more days a week.  Silver Sneakers may be a benefit available to you. To determine eligibility, you may visit the website: www.silversneakers.com or contact program at 5708827575 Mon-Fri between 8AM-8PM.   . Maintain a healthy weight. The body mass index (BMI) is a screening tool to identify possible weight problems. It provides an estimate of body fat based on height and weight. Your health care provider can find your BMI and can help you achieve or maintain a healthy weight.   For adults 20 years and older: ? A BMI below 18.5 is considered underweight. ? A BMI of 18.5 to 24.9 is normal. ? A BMI of 25 to 29.9 is considered overweight. ? A BMI of 30 and above is considered obese.   . Maintain normal blood lipids and cholesterol levels by exercising and minimizing your intake of saturated fat. Eat a balanced diet with plenty of fruit and vegetables. Blood tests for lipids and cholesterol should begin at  age 55 and be repeated every 5 years. If your lipid or cholesterol levels are high, you are over 50, or you are at high risk for heart disease, you may need your cholesterol levels checked more frequently. Ongoing high lipid and cholesterol levels should be treated with medicines if diet and exercise are not working.  . If you smoke, find out from your health care provider how to quit. If you do not use tobacco, please do not start.  . If you choose to drink alcohol, please do not consume more than 2 drinks per day. One drink is considered to  be 12 ounces (355 mL) of beer, 5 ounces (148 mL) of wine, or 1.5 ounces (44 mL) of liquor.  . If you are 76-73 years old, ask your health care provider if you should take aspirin to prevent strokes.  . Use sunscreen. Apply sunscreen liberally and repeatedly throughout the day. You should seek shade when your shadow is shorter than you. Protect yourself by wearing long sleeves, pants, a wide-brimmed hat, and sunglasses year round, whenever you are outdoors.  . Once a month, do a whole body skin exam, using a mirror to look at the skin on your back. Tell your health care provider of new moles, moles that have irregular borders, moles that are larger than a pencil eraser, or moles that have changed in shape or color.

## 2018-07-28 ENCOUNTER — Ambulatory Visit (INDEPENDENT_AMBULATORY_CARE_PROVIDER_SITE_OTHER): Payer: Medicare Other | Admitting: Family Medicine

## 2018-07-28 ENCOUNTER — Encounter: Payer: Self-pay | Admitting: Family Medicine

## 2018-07-28 VITALS — BP 134/70 | HR 63 | Temp 97.8°F | Ht 67.5 in | Wt 212.6 lb

## 2018-07-28 DIAGNOSIS — M1A0790 Idiopathic chronic gout, unspecified ankle and foot, without tophus (tophi): Secondary | ICD-10-CM | POA: Diagnosis not present

## 2018-07-28 DIAGNOSIS — E669 Obesity, unspecified: Secondary | ICD-10-CM

## 2018-07-28 DIAGNOSIS — E7800 Pure hypercholesterolemia, unspecified: Secondary | ICD-10-CM

## 2018-07-28 DIAGNOSIS — L570 Actinic keratosis: Secondary | ICD-10-CM | POA: Diagnosis not present

## 2018-07-28 DIAGNOSIS — E66811 Obesity, class 1: Secondary | ICD-10-CM

## 2018-07-28 DIAGNOSIS — M109 Gout, unspecified: Secondary | ICD-10-CM

## 2018-07-28 DIAGNOSIS — F4310 Post-traumatic stress disorder, unspecified: Secondary | ICD-10-CM | POA: Diagnosis not present

## 2018-07-28 MED ORDER — LOVASTATIN 20 MG PO TABS: ORAL_TABLET | ORAL | 3 refills | Status: DC

## 2018-07-28 MED ORDER — COLCHICINE 0.6 MG PO TABS: 0.6000 mg | ORAL_TABLET | Freq: Every day | ORAL | 3 refills | Status: DC | PRN

## 2018-07-28 MED ORDER — ALLOPURINOL 100 MG PO TABS: 100.0000 mg | ORAL_TABLET | Freq: Every day | ORAL | 11 refills | Status: DC

## 2018-07-28 NOTE — Progress Notes (Signed)
BP 134/70 (BP Location: Left Arm, Patient Position: Sitting, Cuff Size: Large)   Pulse 63   Temp 97.8 F (36.6 C) (Oral)   Ht 5' 7.5" (1.715 m)   Wt 212 lb 9 oz (96.4 kg)   SpO2 97%   BMI 32.80 kg/m    CC: AMW f/u visit Subjective:    Patient ID: [name], male    DOB: [birth year], 73 y.o.   MRN: [id number]  HPI: [name] is a 74 y.o. male presenting on 07/28/2018 for Annual Exam (Pt 2. )   New skin rash to arms - [facility] - treated with chemical in office last week for what sounds like AKs. Continues using 5-FU at home. Did have biopsy positive for skin cancer. Planned f/u at end of month.   No longer following at [facility].   Dx with PTSD through the [place] - started on sertraline 100mg  daily.   Saw Lesia last week for medicare wellness visit. Note reviewed.    Preventative: Colonoscopy 2008 - normal, rpt 10 yrs A[facility]eo Plenty, [place]).Cologuard negative 07/2016.  Prostate -checkedyearly. Always normal. PSA reassuring. Forgot DRE today.  Flu shot yearly Pneumovax 05/2010, prevnar 2016  Td 07/2009 zostavax - did not receive shingrix - discussed Advanced directives -wife Richard thenson Glendell Docker Arran Fessel) would be [employer].Brought form, reviewed and scanned 06/2016. Seat belt use discussed.  Sunscreen use discussed. No changing moles on skin. Non smoker Alcohol - none Dentist q3 mo Eye exam yearly  Caffeine: occaisonally, 2 diet cokes/day Married, lives with wife Retired: Development worker, community Activity: Walks and runs 3 miles a day at [place]. Stays active golfing.  Diet: good water, fruits/vegetables daily, avoiding carbs and watching portion sizes     Relevant past medical, surgical, family and social history reviewed and updated as indicated. Interim medical history since our last visit reviewed. Allergies and medications reviewed and updated. Outpatient Medications Prior to Visit  Medication Sig Dispense Refill  . acetaminophen  (TYLENOL) 500 MG tablet Take 1,000 mg by mouth daily as needed for headache.     . albuterol (PROAIR HFA) 108 (90 Base) MCG/ACT inhaler Inhale 2 puffs into the lungs every 6 (six) hours as needed. for wheezing 8.5 g 3  . chlorhexidine (PERIDEX) 0.12 % solution chlorhexidine gluconate 0.12 % mouthwash    . fluticasone (FLONASE) 50 MCG/ACT nasal spray USE 2 SPRAYS INTO EACH NOSTRIL ONCE DAILY AS NEEDED FOR CONGESTION 16 g 9  . indomethacin (INDOCIN) 25 MG capsule Take 1 capsule (25 mg total) by mouth 2 (two) times daily as needed (gout flare). 60 capsule 0  . Multiple Vitamins-Minerals (MULTIVITAMIN ADULT PO) multivitamin    . Naproxen Sodium (ALEVE PO) Take 2 tablets by mouth as needed.    . pantoprazole (PROTONIX) 40 MG tablet TAKE 1 TABLET BY MOUTH DAILY 30 tablet 2  . sertraline (ZOLOFT) 100 MG tablet Take 100 mg by mouth daily.    . sildenafil (REVATIO) 20 MG tablet TAKE 2-5 TABLETS (40-100 MG TOTAL) BY MOUTH DAILY AS NEEDED. 45 tablet 1  . allopurinol (ZYLOPRIM) 100 MG tablet TAKE 1 TABLET BY MOUTH DAILY *START AFTER GOUT FLARE HAS RESOLVED* 30 tablet 10  . colchicine 0.6 MG tablet Take 1.2 mg PO, one hour later by another 0.6 mg on first day; then 0.6 mg twice daily until attack resolved.1 (Patient taking differently: Take 0.6 mg by mouth every 8 (eight) hours as needed (gout flares). ) 15 tablet 0  . lovastatin (  MEVACOR) 20 MG tablet TAKE 1 TABLET BY MOUTH EVERYDAY AT BEDTIME 90 tablet 0   No facility-administered medications prior to visit.      Per HPI unless specifically indicated in ROS section below Review of Systems Objective:    BP 134/70 (BP Location: Left Arm, Patient Position: Sitting, Cuff Size: Large)   Pulse 63   Temp 97.8 F (36.6 C) (Oral)   Ht 5' 7.5" (1.715 m)   Wt 212 lb 9 oz (96.4 kg)   SpO2 97%   BMI 32.80 kg/m   Wt Readings from Last 3 Encounters:  07/28/18 212 lb 9 oz (96.4 kg)  07/23/18 209 lb 9.6 oz (95.1 kg)  07/24/17 206 lb (93.4 kg)    Physical  Exam Vitals signs and nursing note reviewed.  Constitutional:      General: He is not in acute distress.    Appearance: Normal appearance. He is well-developed.  HENT:     Head: Normocephalic and atraumatic.     Right Ear: Hearing, tympanic membrane, ear canal and external ear normal.     Left Ear: Hearing, tympanic membrane, ear canal and external ear normal.     Nose: Nose normal.     Mouth/Throat:     Mouth: Mucous membranes are moist.     Pharynx: Uvula midline. No oropharyngeal exudate or posterior oropharyngeal erythema.  Eyes:     General: No scleral icterus.    Conjunctiva/sclera: Conjunctivae normal.     Pupils: Pupils are equal, round, and reactive to light.  Neck:     Musculoskeletal: Normal range of motion and neck supple.     Vascular: No carotid bruit.  Cardiovascular:     Rate and Rhythm: Normal rate and regular rhythm.     Pulses: Normal pulses.          Radial pulses are 2+ on the right side and 2+ on the left side.     Heart sounds: Normal heart sounds. No murmur.  Pulmonary:     Effort: Pulmonary effort is normal. No respiratory distress.     Breath sounds: Normal breath sounds. No wheezing, rhonchi or rales.  Abdominal:     General: Bowel sounds are normal. There is no distension.     Palpations: Abdomen is soft. There is no mass.     Tenderness: There is no abdominal tenderness. There is no guarding or rebound.  Musculoskeletal: Normal range of motion.  Lymphadenopathy:     Cervical: No cervical adenopathy.  Skin:    General: Skin is warm and dry.     Findings: Erythema and rash present.     Comments: Erythema of bilateral forearms  Neurological:     Mental Status: He is alert and oriented to person, place, and time.     Comments: CN grossly intact, station and gait intact  Psychiatric:        Mood and Affect: Mood normal.        Behavior: Behavior normal.        Thought Content: Thought content normal.        Judgment: Judgment normal.        Results for orders placed or performed in visit on 07/23/18  TSH  Result Value Ref Range   TSH 2.40 0.35 - 4.50 uIU/mL  PSA, Medicare  Result Value Ref Range   PSA 1.16 0.10 - 4.00 ng/ml  CBC with Differential/Platelet  Result Value Ref Range   WBC 5.7 4.0 - 10.5 K/uL   RBC 4.70  4.22 - 5.81 Mil/uL   Hemoglobin 14.5 13.0 - 17.0 g/dL   HCT 42.0 39.0 - 52.0 %   MCV 89.4 78.0 - 100.0 fl   MCHC 34.4 30.0 - 36.0 g/dL   RDW 13.3 11.5 - 15.5 %   Platelets 266.0 150.0 - 400.0 K/uL   Neutrophils Relative % 53.8 43.0 - 77.0 %   Lymphocytes Relative 29.6 12.0 - 46.0 %   Monocytes Relative 9.4 3.0 - 12.0 %   Eosinophils Relative 6.5 (H) 0.0 - 5.0 %   Basophils Relative 0.7 0.0 - 3.0 %   Neutro Abs 3.1 1.4 - 7.7 K/uL   Lymphs Abs 1.7 0.7 - 4.0 K/uL   Monocytes Absolute 0.5 0.1 - 1.0 K/uL   Eosinophils Absolute 0.4 0.0 - 0.7 K/uL   Basophils Absolute 0.0 0.0 - 0.1 K/uL  Hemoglobin A1c  Result Value Ref Range   Hgb A1c MFr Bld 5.5 4.6 - 6.5 %  Uric acid  Result Value Ref Range   Uric Acid, Serum 7.4 4.0 - 7.8 mg/dL  Comprehensive metabolic panel  Result Value Ref Range   Sodium 141 135 - 145 mEq/L   Potassium 4.1 3.5 - 5.1 mEq/L   Chloride 105 96 - 112 mEq/L   CO2 27 19 - 32 mEq/L   Glucose, Bld 99 70 - 99 mg/dL   BUN 20 6 - 23 mg/dL   Creatinine, Ser 1.04 0.40 - 1.50 mg/dL   Total Bilirubin 0.7 0.2 - 1.2 mg/dL   Alkaline Phosphatase 80 39 - 117 U/L   AST 26 0 - 37 U/L   ALT 22 0 - 53 U/L   Total Protein 6.8 6.0 - 8.3 g/dL   Albumin 4.5 3.5 - 5.2 g/dL   Calcium 9.3 8.4 - 10.5 mg/dL   GFR 69.84 >60.00 mL/min  Lipid panel  Result Value Ref Range   Cholesterol 177 0 - 200 mg/dL   Triglycerides 118.0 0.0 - 149.0 mg/dL   HDL 45.20 >39.00 mg/dL   VLDL 23.6 0.0 - 40.0 mg/dL   LDL Cholesterol 108 (H) 0 - 99 mg/dL   Total CHOL/HDL Ratio 4    NonHDL 131.73    Assessment & Plan:   Problem List Items Addressed This Visit    PTSD (post-traumatic stress disorder)    This is  followed by [facility] psych. On sertraline.       Obesity, Class I, BMI 30-34.9    Encouraged healthy diet and lifestyle efforts for sustainable weight loss.       HYPERCHOLESTEROLEMIA - Primary    Chronic, stable. Tolerating lovastatin. Continue. The 10-year ASCVD risk score Mikey Bussing DC Brooke Bonito., et al., 2013) is: 23.3%   Values used to calculate the score:     Age: 23 years     Sex: Male     Is Non-Hispanic African American: No     Diabetic: No     Tobacco smoker: No     Systolic Blood Pressure: 235 mmHg     Is BP treated: No     HDL Cholesterol: 45.2 mg/dL     Total Cholesterol: 177 mg/dL       Relevant Medications   lovastatin (MEVACOR) 20 MG tablet   Chronic gout    No recent flare. Has not been taking allopurinol. rec start 100mg  daily due to mildly elevated uric acid.       Relevant Medications   colchicine 0.6 MG tablet   Actinic keratosis    Undergoing 5-FU treatment through  derm.           Meds ordered this encounter  Medications  . allopurinol (ZYLOPRIM) 100 MG tablet    Sig: Take 1 tablet (100 mg total) by mouth daily.    Dispense:  30 tablet    Refill:  11  . colchicine 0.6 MG tablet    Sig: Take 1 tablet (0.6 mg total) by mouth daily as needed (gout flare). First dose may take 2 tablets (1.2mg )    Dispense:  30 tablet    Refill:  3  . lovastatin (MEVACOR) 20 MG tablet    Sig: TAKE 1 TABLET BY MOUTH EVERYDAY AT BEDTIME    Dispense:  90 tablet    Refill:  3   No orders of the defined types were placed in this encounter.   Follow up plan: Return in about 1 year (around 07/28/2019) for annual exam, prior fasting for blood work, follow up visit.  Ria Bush, MD

## 2018-07-28 NOTE — Assessment & Plan Note (Signed)
No recent flare. Has not been taking allopurinol. rec start 100mg  daily due to mildly elevated uric acid.

## 2018-07-28 NOTE — Patient Instructions (Addendum)
If interested, check with pharmacy about new 2 shot shingles series (shingrix).  Gout medicines refilled - printed to take to the pharmacy.  You are doing well today Return as needed or in 1 year for next wellness visit.   Health Maintenance After Age 74 After age 106, you are at a higher risk for certain long-term diseases and infections as well as injuries from falls. Falls are a major cause of broken bones and head injuries in people who are older than age 13. Getting regular preventive care can help to keep you healthy and well. Preventive care includes getting regular testing and making lifestyle changes as recommended by your health care provider. Talk with your health care provider about:  Which screenings and tests you should have. A screening is a test that checks for a disease when you have no symptoms.  A diet and exercise plan that is right for you. What should I know about screenings and tests to prevent falls? Screening and testing are the best ways to find a health problem early. Early diagnosis and treatment give you the best chance of managing medical conditions that are common after age 2. Certain conditions and lifestyle choices may make you more likely to have a fall. Your health care provider may recommend:  Regular vision checks. Poor vision and conditions such as cataracts can make you more likely to have a fall. If you wear glasses, make sure to get your prescription updated if your vision changes.  Medicine review. Work with your health care provider to regularly review all of the medicines you are taking, including over-the-counter medicines. Ask your health care provider about any side effects that may make you more likely to have a fall. Tell your health care provider if any medicines that you take make you feel dizzy or sleepy.  Osteoporosis screening. Osteoporosis is a condition that causes the bones to get weaker. This can make the bones weak and cause them to break  more easily.  Blood pressure screening. Blood pressure changes and medicines to control blood pressure can make you feel dizzy.  Strength and balance checks. Your health care provider may recommend certain tests to check your strength and balance while standing, walking, or changing positions.  Foot health exam. Foot pain and numbness, as well as not wearing proper footwear, can make you more likely to have a fall.  Depression screening. You may be more likely to have a fall if you have a fear of falling, feel emotionally low, or feel unable to do activities that you used to do.  Alcohol use screening. Using too much alcohol can affect your balance and may make you more likely to have a fall. What actions can I take to lower my risk of falls? General instructions  Talk with your health care provider about your risks for falling. Tell your health care provider if: ? You fall. Be sure to tell your health care provider about all falls, even ones that seem minor. ? You feel dizzy, sleepy, or off-balance.  Take over-the-counter and prescription medicines only as told by your health care provider. These include any supplements.  Eat a healthy diet and maintain a healthy weight. A healthy diet includes low-fat dairy products, low-fat (lean) meats, and fiber from whole grains, beans, and lots of fruits and vegetables. Home safety  Remove any tripping hazards, such as rugs, cords, and clutter.  Install safety equipment such as grab bars in bathrooms and safety rails on stairs.  Keep  rooms and walkways well-lit. Activity   Follow a regular exercise program to stay fit. This will help you maintain your balance. Ask your health care provider what types of exercise are appropriate for you.  If you need a cane or walker, use it as recommended by your health care provider.  Wear supportive shoes that have nonskid soles. Lifestyle  Do not drink alcohol if your health care provider tells you not  to drink.  If you drink alcohol, limit how much you have: ? 0-1 drink a day for women. ? 0-2 drinks a day for men.  Be aware of how much alcohol is in your drink. In the [place] one drink equals one typical bottle of beer (12 oz), one-half glass of wine (5 oz), or one shot of hard liquor (1 oz).  Do not use any products that contain nicotine or tobacco, such as cigarettes and e-cigarettes. If you need help quitting, ask your health care provider. Summary  Having a healthy lifestyle and getting preventive care can help to protect your health and wellness after age 74.  Screening and testing are the best way to find a health problem early and help you avoid having a fall. Early diagnosis and treatment give you the best chance for managing medical conditions that are more common for people who are older than age 81.  Falls are a major cause of broken bones and head injuries in people who are older than age 28. Take precautions to prevent a fall at home.  Work with your health care provider to learn what changes you can make to improve your health and wellness and to prevent falls. This information is not intended to replace advice given to you by your health care provider. Make sure you discuss any questions you have with your health care provider. Document Released: 03/25/2017 Document Revised: 03/25/2017 Document Reviewed: 03/25/2017 Elsevier Interactive Patient Education  2019 [employer].

## 2018-07-28 NOTE — Assessment & Plan Note (Signed)
Undergoing 5-FU treatment through derm.

## 2018-07-28 NOTE — Assessment & Plan Note (Signed)
Chronic, stable. Tolerating lovastatin. Continue. The 10-year ASCVD risk score Mikey Bussing DC Brooke Bonito., et al., 2013) is: 23.3%   Values used to calculate the score:     Age: 73 years     Sex: Male     Is Non-Hispanic African American: No     Diabetic: No     Tobacco smoker: No     Systolic Blood Pressure: 068 mmHg     Is BP treated: No     HDL Cholesterol: 45.2 mg/dL     Total Cholesterol: 177 mg/dL

## 2018-07-28 NOTE — Assessment & Plan Note (Signed)
This is followed by [facility] psych. On sertraline.

## 2018-07-28 NOTE — Assessment & Plan Note (Signed)
Encouraged healthy diet and lifestyle efforts for sustainable weight loss.

## 2018-08-05 DIAGNOSIS — Z471 Aftercare following joint replacement surgery: Secondary | ICD-10-CM | POA: Diagnosis not present

## 2018-08-05 DIAGNOSIS — Z96653 Presence of artificial knee joint, bilateral: Secondary | ICD-10-CM | POA: Diagnosis not present

## 2018-08-05 DIAGNOSIS — M170 Bilateral primary osteoarthritis of knee: Secondary | ICD-10-CM | POA: Diagnosis not present

## 2018-08-18 DIAGNOSIS — M25511 Pain in right shoulder: Secondary | ICD-10-CM | POA: Diagnosis not present

## 2018-08-24 ENCOUNTER — Encounter: Payer: Self-pay | Admitting: Family Medicine

## 2018-08-24 DIAGNOSIS — M19011 Primary osteoarthritis, right shoulder: Secondary | ICD-10-CM | POA: Insufficient documentation

## 2018-09-05 NOTE — Progress Notes (Signed)
I reviewed health advisor's note, was available for consultation, and agree with documentation and plan.  

## 2018-09-15 DIAGNOSIS — M7702 Medial epicondylitis, left elbow: Secondary | ICD-10-CM | POA: Diagnosis not present

## 2018-10-27 DIAGNOSIS — H524 Presbyopia: Secondary | ICD-10-CM | POA: Diagnosis not present

## 2018-10-27 DIAGNOSIS — H2513 Age-related nuclear cataract, bilateral: Secondary | ICD-10-CM | POA: Diagnosis not present

## 2018-12-01 DIAGNOSIS — H1032 Unspecified acute conjunctivitis, left eye: Secondary | ICD-10-CM | POA: Diagnosis not present

## 2018-12-01 DIAGNOSIS — E119 Type 2 diabetes mellitus without complications: Secondary | ICD-10-CM | POA: Diagnosis not present

## 2018-12-28 ENCOUNTER — Telehealth: Payer: Self-pay | Admitting: Family Medicine

## 2018-12-31 ENCOUNTER — Telehealth: Payer: Self-pay

## 2018-12-31 NOTE — Telephone Encounter (Signed)
Left message on vm for pt to call back.  Dr. Darnell Level is asking what exactly does pt need letter to say.

## 2018-12-31 NOTE — Telephone Encounter (Signed)
Mrs Reynold left v/m (no DPR) requesting letter stating pt needs to go to [facility] to exercise in gym. [facility] will open with restrictions on 01/04/19. Mrs Ambrosini request cb.Please advise.

## 2018-12-31 NOTE — Telephone Encounter (Signed)
plz find out what letter needs to say.

## 2019-01-03 NOTE — Telephone Encounter (Signed)
Letter written and in Lisa's box.  

## 2019-01-03 NOTE — Telephone Encounter (Signed)
Noted.    Left message on vm for pt to call back.  Need to notify him the letter is ready and see if he wants it mailed or does he want to pick it up.  [Placed letter in [employer] on [employer].]

## 2019-01-03 NOTE — Telephone Encounter (Signed)
See TE, 01/03/19

## 2019-01-03 NOTE — Telephone Encounter (Signed)
Left message on vm for pt to call back.  Dr. Darnell Level is asking what exactly does pt need letter to say.

## 2019-01-03 NOTE — Telephone Encounter (Signed)
Patient wife called back and said the letter needs to state that it's important for patient's health for him to exercise.

## 2019-01-04 NOTE — Telephone Encounter (Signed)
Pt's wife returned call about letter.  Will pick it up.  [Placed letter at front office.]

## 2019-02-10 DIAGNOSIS — Z23 Encounter for immunization: Secondary | ICD-10-CM | POA: Diagnosis not present

## 2019-03-24 ENCOUNTER — Other Ambulatory Visit: Payer: Self-pay | Admitting: Family Medicine

## 2019-05-31 ENCOUNTER — Telehealth: Payer: Self-pay

## 2019-05-31 NOTE — Telephone Encounter (Addendum)
Mrs Hoeger (Do not see DPR) request refill of inhaler to [place]. Request cb to notify how to get med from [place] instead of drug store. Last annual 07/28/2018.

## 2019-06-01 MED ORDER — ALBUTEROL SULFATE HFA 108 (90 BASE) MCG/ACT IN AERS: 2.0000 | INHALATION_SPRAY | Freq: Four times a day (QID) | RESPIRATORY_TRACT | 3 refills | Status: DC | PRN

## 2019-06-01 NOTE — Telephone Encounter (Signed)
Printed and in Lisa's box.  

## 2019-06-01 NOTE — Telephone Encounter (Signed)
Dr. Darnell Level I found out from [name] that we would need to give printed RX to the patient and they would have to take it to [facility] to have this filled. Is this ok to do?

## 2019-06-02 NOTE — Telephone Encounter (Signed)
Spoke with pt notifying him a printed rx is ready to pick up to take to [facility] hospital.    [Placed rx at front office.]

## 2019-06-23 ENCOUNTER — Other Ambulatory Visit: Payer: Self-pay | Admitting: Family Medicine

## 2019-06-27 ENCOUNTER — Other Ambulatory Visit: Payer: Self-pay | Admitting: Family Medicine

## 2019-06-27 NOTE — Telephone Encounter (Signed)
Pharmacy requests refill on:   LAST REFILL: 08/04/17 LAST OV: 07/28/18 NEXT OV:08/01/19 PHARMACY: [facility] [place],

## 2019-07-10 ENCOUNTER — Other Ambulatory Visit: Payer: Self-pay | Admitting: Family Medicine

## 2019-07-24 ENCOUNTER — Other Ambulatory Visit: Payer: Self-pay | Admitting: Family Medicine

## 2019-07-24 DIAGNOSIS — E7800 Pure hypercholesterolemia, unspecified: Secondary | ICD-10-CM

## 2019-07-24 DIAGNOSIS — R7301 Impaired fasting glucose: Secondary | ICD-10-CM

## 2019-07-24 DIAGNOSIS — Z125 Encounter for screening for malignant neoplasm of prostate: Secondary | ICD-10-CM

## 2019-07-24 DIAGNOSIS — M1A0790 Idiopathic chronic gout, unspecified ankle and foot, without tophus (tophi): Secondary | ICD-10-CM

## 2019-07-25 DIAGNOSIS — C4492 Squamous cell carcinoma of skin, unspecified: Secondary | ICD-10-CM

## 2019-07-25 HISTORY — DX: Squamous cell carcinoma of skin, unspecified: C44.92

## 2019-07-26 ENCOUNTER — Other Ambulatory Visit: Payer: Self-pay

## 2019-07-26 ENCOUNTER — Other Ambulatory Visit (INDEPENDENT_AMBULATORY_CARE_PROVIDER_SITE_OTHER): Payer: Medicare Other

## 2019-07-26 DIAGNOSIS — R7301 Impaired fasting glucose: Secondary | ICD-10-CM

## 2019-07-26 DIAGNOSIS — E7800 Pure hypercholesterolemia, unspecified: Secondary | ICD-10-CM

## 2019-07-26 DIAGNOSIS — Z125 Encounter for screening for malignant neoplasm of prostate: Secondary | ICD-10-CM | POA: Diagnosis not present

## 2019-07-26 DIAGNOSIS — M1A0790 Idiopathic chronic gout, unspecified ankle and foot, without tophus (tophi): Secondary | ICD-10-CM

## 2019-07-26 LAB — COMPREHENSIVE METABOLIC PANEL
ALT: 18 U/L (ref 0–53)
AST: 20 U/L (ref 0–37)
Albumin: 4.1 g/dL (ref 3.5–5.2)
Alkaline Phosphatase: 80 U/L (ref 39–117)
BUN: 18 mg/dL (ref 6–23)
CO2: 29 mEq/L (ref 19–32)
Calcium: 9.6 mg/dL (ref 8.4–10.5)
Chloride: 104 mEq/L (ref 96–112)
Creatinine, Ser: 1.03 mg/dL (ref 0.40–1.50)
GFR: 70.43 mL/min (ref >60.00)
Glucose, Bld: 101 mg/dL — ABNORMAL HIGH (ref 70–99)
Potassium: 4.2 mEq/L (ref 3.5–5.1)
Sodium: 142 mEq/L (ref 135–145)
Total Bilirubin: 0.6 mg/dL (ref 0.2–1.2)
Total Protein: 6.9 g/dL (ref 6.0–8.3)

## 2019-07-26 LAB — LIPID PANEL
Cholesterol: 198 mg/dL (ref 0–200)
HDL: 40.3 mg/dL (ref >39.00)
LDL Cholesterol: 134 mg/dL — ABNORMAL HIGH (ref 0–99)
NonHDL: 158.12
Total CHOL/HDL Ratio: 5
Triglycerides: 123 mg/dL (ref 0.0–149.0)
VLDL: 24.6 mg/dL (ref 0.0–40.0)

## 2019-07-26 LAB — MICROALBUMIN / CREATININE URINE RATIO
Creatinine,U: 105.6 mg/dL
Microalb Creat Ratio: 0.7 mg/g (ref 0.0–30.0)
Microalb, Ur: <0.7 mg/dL (ref 0.0–1.9)

## 2019-07-26 LAB — HEMOGLOBIN A1C: Hgb A1c MFr Bld: 5.6 % (ref 4.6–6.5)

## 2019-07-26 LAB — URIC ACID: Uric Acid, Serum: 7.1 mg/dL (ref 4.0–7.8)

## 2019-07-26 LAB — PSA, MEDICARE: PSA: 0.72 ng/ml (ref 0.10–4.00)

## 2019-07-27 ENCOUNTER — Ambulatory Visit (INDEPENDENT_AMBULATORY_CARE_PROVIDER_SITE_OTHER): Payer: Medicare Other

## 2019-07-27 ENCOUNTER — Other Ambulatory Visit: Payer: Self-pay

## 2019-07-27 VITALS — BP 160/80 | Wt 210.0 lb

## 2019-07-27 DIAGNOSIS — Z0000 Encounter for general adult medical examination without abnormal findings: Secondary | ICD-10-CM

## 2019-07-27 NOTE — Progress Notes (Signed)
Subjective:   [name] is a 74 y.o. male who presents for Medicare Annual/Subsequent preventive examination.  Review of Systems: N/A   This visit is being conducted through telemedicine via telephone at the nurse health advisor's home address due to the COVID-19 pandemic. This patient has given me verbal consent via doximity to conduct this visit, patient states they are participating from their home address. Patient and myself are on the telephone call. There is no referral for this visit. Some vital signs may be absent or patient reported.    Patient identification: identified by name, DOB, and current address   Cardiac Risk Factors include: advanced age (>33men, >81 women);male gender;Other (see comment), Risk factor comments: hypercholesterolemia     Objective:    Vitals: BP (!) 160/80   Wt 210 lb (95.3 kg)   BMI 32.41 kg/m   Body mass index is 32.41 kg/m.  Advanced Directives 07/27/2019 07/23/2018 07/17/2017 08/13/2016 08/05/2016 07/18/2016 07/16/2016  Does Patient Have a Medical Advance Directive? Yes Yes Yes Yes Yes Yes Yes  Type of Paramedic of [place];Living will [place];Living will [place];Living will [place];Living will [place];Living will - [place];Living will  Does patient want to make changes to medical advance directive? - - No - Patient declined No - Patient declined No - Patient declined No - Patient declined -  Copy of [place] in Chart? Yes - validated most recent copy scanned in chart (See row information) No - copy requested Yes No - copy requested Yes Yes No - copy requested  Pre-existing out of facility DNR order (yellow form or pink MOST form) - - - - - - -    Tobacco Social History   Tobacco Use  Smoking Status Never Smoker  Smokeless Tobacco Never Used     Counseling given: Not Answered   Clinical  Intake:  Pre-visit preparation completed: Yes  Pain : No/denies pain     Nutritional Risks: None Diabetes: No  How often do you need to have someone help you when you read instructions, pamphlets, or other written materials from your doctor or pharmacy?: 1 - Never What is the last grade level you completed in school?: 4 year degree  Interpreter Needed?: No  Information entered by :: CJohnson, LPN  Past Medical History:  Diagnosis Date  . AR (allergic rhinitis)   . Arthritis    PAIN AND OA LEFT KNEE ;  S/P RIGHT TOTAL KNEE REPLACEMENT  . Complication of anesthesia    STATES SEVERE PAIN WAKING UP AFTER GENERAL ANESTHESIA; AND FEELS BAD.  . ED (erectile dysfunction)   . GERD (gastroesophageal reflux disease)   . Gout    per pt  . History of kidney stones   . History of nephrolithiasis    seen Dr. [facility], last 02/2011  . Hypercholesterolemia   . RBBB (right bundle branch block)    Past Surgical History:  Procedure Laterality Date  . 2D echo  99991111   mild diastolic dysfunction, nl EF   . COLONOSCOPY  05/2006   normal, rpt 10 yrs Amedeo Plenty, [place])  . KNEE ARTHROSCOPY Right 2000  . L thumb joint replace Left 2005   GRAMIG   . [place]  . TOTAL HIP ARTHROPLASTY Right 08/13/2016   Procedure: RIGHT TOTAL HIP ARTHROPLASTY ANTERIOR APPROACH;  Surgeon: Gaynelle Arabian, MD;  Location: [facility];  Service: Orthopedics;  Laterality: Right;  . TOTAL KNEE ARTHROPLASTY Right 12/2010  . TOTAL KNEE ARTHROPLASTY Left 05/08/2014   Procedure: LEFT TOTAL KNEE ARTHROPLASTY;  Surgeon: Gearlean Alf, MD;  Location: [facility];  Service: Orthopedics;  Laterality: Left;   Family History  Problem Relation Age of Onset  . Cancer Father        lung and brain (smoker)  . Stroke Paternal Grandmother 83       stroke or heart attack (unsure)  . Cancer Mother        lung (smoker)  . Cancer Maternal Grandmother        leukemia  . CAD Neg Hx        unsure (see above)    . Diabetes Neg Hx    Social History   Socioeconomic History  . Marital status: Married    Spouse name: Not on file  . Number of children: Not on file  . Years of education: Not on file  . Highest education level: Not on file  Occupational History  . Not on file  Tobacco Use  . Smoking status: Never Smoker  . Smokeless tobacco: Never Used  Substance and Sexual Activity  . Alcohol use: No  . Drug use: No  . Sexual activity: Yes  Other Topics Concern  . Not on file  Social History Narrative   Caffeine: occaisonally, 2 diet cokes/day   Married, lives with wife   Retired: Development worker, community   Activity: Walks and runs 3 miles a day. at [place]. Stays active golfing.    Diet: good water, fruits/vegetables daily, avoiding carbs and watching portion sizes   Social Determinants of Health   Financial Resource Strain: Low Risk   . Difficulty of Paying Living Expenses: Not hard at all  Food Insecurity: No Food Insecurity  . Worried About Charity fundraiser in the Last Year: Never true  . Ran Out of Food in the Last Year: Never true  Transportation Needs: No Transportation Needs  . Lack of Transportation (Medical): No  . Lack of Transportation (Non-Medical): No  Physical Activity: Sufficiently Active  . Days of Exercise per Week: 5 days  . Minutes of Exercise per Session: 50 min  Stress: No Stress Concern Present  . Feeling of Stress : Not at all  Social Connections:   . Frequency of Communication with Friends and Family: Not on file  . Frequency of Social Gatherings with Friends and Family: Not on file  . Attends Religious Services: Not on file  . Active Member of Clubs or Organizations: Not on file  . Attends Archivist Meetings: Not on file  . Marital Status: Not on file    Outpatient Encounter Medications as of 07/27/2019  Medication Sig  . acetaminophen (TYLENOL) 500 MG tablet Take 1,000 mg by mouth daily as needed for headache.   . albuterol (VENTOLIN HFA) 108 (90  Base) MCG/ACT inhaler INHALE TWO PUFFS INTO THE LUNGS EBVERY 6 HOURS AS NEEDED  . allopurinol (ZYLOPRIM) 100 MG tablet Take 1 tablet (100 mg total) by mouth daily.  . chlorhexidine (PERIDEX) 0.12 % solution chlorhexidine gluconate 0.12 % mouthwash  . colchicine 0.6 MG tablet Take 1 tablet (0.6 mg total) by mouth daily as needed (gout flare). First dose may take 2 tablets (1.2mg )  . fluticasone (FLONASE) 50 MCG/ACT nasal spray USE 2 SPRAYS INTO EACH NOSTRIL ONCE DAILY AS NEEDED FOR CONGESTION  . indomethacin (INDOCIN) 25 MG capsule Take 1 capsule (25 mg total) by mouth 2 (  two) times daily as needed (gout flare).  Marland Kitchen lovastatin (MEVACOR) 20 MG tablet TAKE 1 TABLET BY MOUTH EVERYDAY AT BEDTIME  . Multiple Vitamins-Minerals (MULTIVITAMIN ADULT PO) multivitamin  . Naproxen Sodium (ALEVE PO) Take 2 tablets by mouth as needed.  . pantoprazole (PROTONIX) 40 MG tablet TAKE 1 TABLET BY MOUTH EVERY DAY  . sertraline (ZOLOFT) 100 MG tablet Take 100 mg by mouth daily.  . sildenafil (REVATIO) 20 MG tablet TAKE 2-5 TABLETS (40-100 MG TOTAL) BY MOUTH DAILY AS NEEDED.   No facility-administered encounter medications on file as of 07/27/2019.    Activities of Daily Living In your present state of health, do you have any difficulty performing the following activities: 07/27/2019  Hearing? Y  Comment wears hearing aids  Vision? N  Difficulty concentrating or making decisions? N  Walking or climbing stairs? N  Dressing or bathing? N  Doing errands, shopping? N  Preparing Food and eating ? N  Using the Toilet? N  In the past six months, have you accidently leaked urine? N  Do you have problems with loss of bowel control? N  Managing your Medications? N  Managing your Finances? N  Housekeeping or managing your Housekeeping? N  Some recent data might be hidden    Patient Care Team: Ria Bush, MD as PCP - General (Family Medicine) Sharyne Peach, MD as Consulting Physician (Ophthalmology) Gaynelle Arabian, MD as Consulting Physician (Orthopedic Surgery)   Assessment:   This is a routine wellness examination for [place].  Exercise Activities and Dietary recommendations Current Exercise Habits: Structured exercise class, Type of exercise: strength training/weights;stretching, Time (Minutes): 45, Frequency (Times/Week): 5, Weekly Exercise (Minutes/Week): 225, Intensity: Moderate, Exercise limited by: None identified  Goals    . Increase physical activity     Starting 07/23/2018, I will continue to exercise for 90 minutes 6 days per week.     . Patient Stated     07/27/2019, I will continue to go to my exercise class 5 days a week for 45 minutes.        Fall Risk Fall Risk  07/27/2019 07/23/2018 07/17/2017 07/17/2017 07/16/2016  Falls in the past year? 0 0 No No No  Number falls in past yr: 0 - - - -  Injury with Fall? 0 - - - -  Risk for fall due to : No Fall Risks - - - -  Follow up Falls evaluation completed;Falls prevention discussed - - - -   Is the patient's home free of loose throw rugs in walkways, pet beds, electrical cords, etc?   yes      Grab bars in the bathroom? no      Handrails on the stairs?   yes      Adequate lighting?   yes  Timed Get Up and Go Performed: N/A  Depression Screen PHQ 2/9 Scores 07/27/2019 07/23/2018 07/17/2017 07/17/2017  PHQ - 2 Score 4 4 2 2   PHQ- 9 Score 4 8 4 4     Cognitive Function MMSE - Mini Mental State Exam 07/27/2019 07/23/2018 07/17/2017 07/16/2016  Orientation to time 5 5 5 5   Orientation to Place 5 5 5 5   Registration 3 3 3 3   Attention/ Calculation 5 0 0 0  Recall 3 3 3 3   Language- name 2 objects - 0 0 0  Language- repeat 1 1 1 1   Language- follow 3 step command - 3 3 3   Language- read & follow direction - 0 0 0  Write a  sentence - 0 0 0  Copy design - 0 0 0  Total score - 20 20 20   Mini Cog  Mini-Cog screen was completed. Maximum score is 22. A value of 0 denotes this part of the MMSE was not completed or the patient failed this  part of the Mini-Cog screening.       Immunization History  Administered Date(s) Administered  . Influenza Whole 02/20/2010, 02/26/2011  . Influenza, High Dose Seasonal PF 02/19/2017, 02/23/2018, 02/10/2019  . Influenza,inj,Quad PF,6+ Mos 02/23/2013, 03/22/2014, 03/12/2015, 03/11/2016  . Influenza,inj,quad, With Preservative 02/23/2018  . Pneumococcal Conjugate-13 06/21/2014  . Pneumococcal Polysaccharide-23 06/12/2010  . Td 07/24/2009    Qualifies for Shingles Vaccine: Yes   Screening Tests Health Maintenance  Topic Date Due  . DTaP/Tdap/Td (2 - Tdap) 07/25/2019  . DTAP VACCINES (1) 07/26/2024 (Originally 11/22/1945)  . TETANUS/TDAP  07/26/2024 (Originally 07/25/2019)  . Fecal DNA (Cologuard)  08/08/2019  . URINE MICROALBUMIN  07/25/2020  . INFLUENZA VACCINE  Completed  . Hepatitis C Screening  Completed  . PNA vac Low Risk Adult  Completed   Cancer Screenings: Lung: Low Dose CT Chest recommended if Age 52-80 years, 30 pack-year currently smoking OR have quit w/in 15years. Patient does not qualify. Colorectal: Cologuard completed 08/07/2016  Additional Screenings:  Hepatitis C Screening: 06/26/2015      Plan:   Patient will continue to exercise 5 days a week for 45 minutes.   I have personally reviewed and noted the following in the patient's chart:   . Medical and social history . Use of alcohol, tobacco or illicit drugs  . Current medications and supplements . Functional ability and status . Nutritional status . Physical activity . Advanced directives . List of other physicians . Hospitalizations, surgeries, and ER visits in previous 12 months . Vitals . Screenings to include cognitive, depression, and falls . Referrals and appointments  In addition, I have reviewed and discussed with patient certain preventive protocols, quality metrics, and best practice recommendations. A written personalized care plan for preventive services as well as general preventive  health recommendations were provided to patient.     Andrez Grime, LPN  [phone]

## 2019-07-27 NOTE — Patient Instructions (Signed)
[name] , Thank you for taking time to come for your Medicare Wellness Visit. I appreciate your ongoing commitment to your health goals. Please review the following plan we discussed and let me know if I can assist you in the future.   Screening recommendations/referrals: Colonoscopy: Cologuard completed 08/07/2016 Recommended yearly ophthalmology/optometry visit for glaucoma screening and checkup Recommended yearly dental visit for hygiene and checkup  Vaccinations: Influenza vaccine: Up to date, completed 02/10/2019 Pneumococcal vaccine: Completed series Tdap vaccine: decline Shingles vaccine: discussed    Advanced directives: copy in chart  Conditions/risks identified: hypercholesterolemia  Next appointment: 08/01/2019 @ 11:30 am   Preventive Care 46 Years and Older, Male Preventive care refers to lifestyle choices and visits with your health care provider that can promote health and wellness. What does preventive care include?  A yearly physical exam. This is also called an annual well check.  Dental exams once or twice a year.  Routine eye exams. Ask your health care provider how often you should have your eyes checked.  Personal lifestyle choices, including:  Daily care of your teeth and gums.  Regular physical activity.  Eating a healthy diet.  Avoiding tobacco and drug use.  Limiting alcohol use.  Practicing safe sex.  Taking low doses of aspirin every day.  Taking vitamin and mineral supplements as recommended by your health care provider. What happens during an annual well check? The services and screenings done by your health care provider during your annual well check will depend on your age, overall health, lifestyle risk factors, and family history of disease. Counseling  Your health care provider may ask you questions about your:  Alcohol use.  Tobacco use.  Drug use.  Emotional well-being.  Home and relationship well-being.  Sexual  activity.  Eating habits.  History of falls.  Memory and ability to understand (cognition).  Work and work Statistician. Screening  You may have the following tests or measurements:  Height, weight, and BMI.  Blood pressure.  Lipid and cholesterol levels. These may be checked every 5 years, or more frequently if you are over 63 years old.  Skin check.  Lung cancer screening. You may have this screening every year starting at age 79 if you have a 30-pack-year history of smoking and currently smoke or have quit within the past 15 years.  Fecal occult blood test (FOBT) of the stool. You may have this test every year starting at age 6.  Flexible sigmoidoscopy or colonoscopy. You may have a sigmoidoscopy every 5 years or a colonoscopy every 10 years starting at age 81.  Prostate cancer screening. Recommendations will vary depending on your family history and other risks.  Hepatitis C blood test.  Hepatitis B blood test.  Sexually transmitted disease (STD) testing.  Diabetes screening. This is done by checking your blood sugar (glucose) after you have not eaten for a while (fasting). You may have this done every 1-3 years.  Abdominal aortic aneurysm (AAA) screening. You may need this if you are a current or former smoker.  Osteoporosis. You may be screened starting at age 36 if you are at high risk. Talk with your health care provider about your test results, treatment options, and if necessary, the need for more tests. Vaccines  Your health care provider may recommend certain vaccines, such as:  Influenza vaccine. This is recommended every year.  Tetanus, diphtheria, and acellular pertussis (Tdap, Td) vaccine. You may need a Td booster every 10 years.  Zoster vaccine. You may  need this after age 90.  Pneumococcal 13-valent conjugate (PCV13) vaccine. One dose is recommended after age 22.  Pneumococcal polysaccharide (PPSV23) vaccine. One dose is recommended after age  8. Talk to your health care provider about which screenings and vaccines you need and how often you need them. This information is not intended to replace advice given to you by your health care provider. Make sure you discuss any questions you have with your health care provider. Document Released: 06/08/2015 Document Revised: 01/30/2016 Document Reviewed: 03/13/2015 Elsevier Interactive Patient Education  2017 [place] Prevention in the Home Falls can cause injuries. They can happen to people of all ages. There are many things you can do to make your home safe and to help prevent falls. What can I do on the outside of my home?  Regularly fix the edges of walkways and driveways and fix any cracks.  Remove anything that might make you trip as you walk through a door, such as a raised step or threshold.  Trim any bushes or trees on the path to your home.  Use bright outdoor lighting.  Clear any walking paths of anything that might make someone trip, such as rocks or tools.  Regularly check to see if handrails are loose or broken. Make sure that both sides of any steps have handrails.  Any raised decks and porches should have guardrails on the edges.  Have any leaves, snow, or ice cleared regularly.  Use sand or salt on walking paths during winter.  Clean up any spills in your garage right away. This includes oil or grease spills. What can I do in the bathroom?  Use night lights.  Install grab bars by the toilet and in the tub and shower. Do not use towel bars as grab bars.  Use non-skid mats or decals in the tub or shower.  If you need to sit down in the shower, use a plastic, non-slip stool.  Keep the floor dry. Clean up any water that spills on the floor as soon as it happens.  Remove soap buildup in the tub or shower regularly.  Attach bath mats securely with double-sided non-slip rug tape.  Do not have throw rugs and other things on the floor that can make  you trip. What can I do in the bedroom?  Use night lights.  Make sure that you have a light by your bed that is easy to reach.  Do not use any sheets or blankets that are too big for your bed. They should not hang down onto the floor.  Have a firm chair that has side arms. You can use this for support while you get dressed.  Do not have throw rugs and other things on the floor that can make you trip. What can I do in the kitchen?  Clean up any spills right away.  Avoid walking on wet floors.  Keep items that you use a lot in easy-to-reach places.  If you need to reach something above you, use a strong step stool that has a grab bar.  Keep electrical cords out of the way.  Do not use floor polish or wax that makes floors slippery. If you must use wax, use non-skid floor wax.  Do not have throw rugs and other things on the floor that can make you trip. What can I do with my stairs?  Do not leave any items on the stairs.  Make sure that there are handrails on both sides  of the stairs and use them. Fix handrails that are broken or loose. Make sure that handrails are as long as the stairways.  Check any carpeting to make sure that it is firmly attached to the stairs. Fix any carpet that is loose or worn.  Avoid having throw rugs at the top or bottom of the stairs. If you do have throw rugs, attach them to the floor with carpet tape.  Make sure that you have a light switch at the top of the stairs and the bottom of the stairs. If you do not have them, ask someone to add them for you. What else can I do to help prevent falls?  Wear shoes that:  Do not have high heels.  Have rubber bottoms.  Are comfortable and fit you well.  Are closed at the toe. Do not wear sandals.  If you use a stepladder:  Make sure that it is fully opened. Do not climb a closed stepladder.  Make sure that both sides of the stepladder are locked into place.  Ask someone to hold it for you, if  possible.  Clearly mark and make sure that you can see:  Any grab bars or handrails.  First and last steps.  Where the edge of each step is.  Use tools that help you move around (mobility aids) if they are needed. These include:  Canes.  Walkers.  Scooters.  Crutches.  Turn on the lights when you go into a dark area. Replace any light bulbs as soon as they burn out.  Set up your furniture so you have a clear path. Avoid moving your furniture around.  If any of your floors are uneven, fix them.  If there are any pets around you, be aware of where they are.  Review your medicines with your doctor. Some medicines can make you feel dizzy. This can increase your chance of falling. Ask your doctor what other things that you can do to help prevent falls. This information is not intended to replace advice given to you by your health care provider. Make sure you discuss any questions you have with your health care provider. Document Released: 03/08/2009 Document Revised: 10/18/2015 Document Reviewed: 06/16/2014 Elsevier Interactive Patient Education  2017 [employer].

## 2019-07-27 NOTE — Progress Notes (Signed)
PCP notes:  Health Maintenance: Tdap- insurance/financial Cologuard- due as of 08/08/2019   Abnormal Screenings: none   Patient concerns: none   Nurse concerns: none   Next PCP appt.: 08/01/2019 @ 11:30 am

## 2019-08-01 ENCOUNTER — Ambulatory Visit (INDEPENDENT_AMBULATORY_CARE_PROVIDER_SITE_OTHER): Payer: Medicare Other | Admitting: Family Medicine

## 2019-08-01 ENCOUNTER — Other Ambulatory Visit: Payer: Self-pay

## 2019-08-01 ENCOUNTER — Ambulatory Visit: Payer: Medicare Other

## 2019-08-01 ENCOUNTER — Encounter: Payer: Self-pay | Admitting: Family Medicine

## 2019-08-01 DIAGNOSIS — K219 Gastro-esophageal reflux disease without esophagitis: Secondary | ICD-10-CM | POA: Diagnosis not present

## 2019-08-01 DIAGNOSIS — F4310 Post-traumatic stress disorder, unspecified: Secondary | ICD-10-CM

## 2019-08-01 DIAGNOSIS — E669 Obesity, unspecified: Secondary | ICD-10-CM

## 2019-08-01 DIAGNOSIS — R011 Cardiac murmur, unspecified: Secondary | ICD-10-CM | POA: Diagnosis not present

## 2019-08-01 DIAGNOSIS — E7800 Pure hypercholesterolemia, unspecified: Secondary | ICD-10-CM

## 2019-08-01 DIAGNOSIS — M1A0790 Idiopathic chronic gout, unspecified ankle and foot, without tophus (tophi): Secondary | ICD-10-CM

## 2019-08-01 MED ORDER — CO-ENZYME Q-10 50 MG PO CAPS: 50.0000 mg | ORAL_CAPSULE | Freq: Every day | ORAL | Status: AC

## 2019-08-01 NOTE — Patient Instructions (Addendum)
We will sign you up for cologuard stool test.  Good to see you today, call us with questions.  Return as needed or in 1 year for next wellness visit.  Health Maintenance After Age 75 After age 60, you are at a higher risk for certain long-term diseases and infections as well as injuries from falls. Falls are a major cause of broken bones and head injuries in people who are older than age 77. Getting regular preventive care can help to keep you healthy and well. Preventive care includes getting regular testing and making lifestyle changes as recommended by your health care provider. Talk with your health care provider about:  Which screenings and tests you should have. A screening is a test that checks for a disease when you have no symptoms.  A diet and exercise plan that is right for you. What should I know about screenings and tests to prevent falls? Screening and testing are the best ways to find a health problem early. Early diagnosis and treatment give you the best chance of managing medical conditions that are common after age 3. Certain conditions and lifestyle choices may make you more likely to have a fall. Your health care provider may recommend:  Regular vision checks. Poor vision and conditions such as cataracts can make you more likely to have a fall. If you wear glasses, make sure to get your prescription updated if your vision changes.  Medicine review. Work with your health care provider to regularly review all of the medicines you are taking, including over-the-counter medicines. Ask your health care provider about any side effects that may make you more likely to have a fall. Tell your health care provider if any medicines that you take make you feel dizzy or sleepy.  Osteoporosis screening. Osteoporosis is a condition that causes the bones to get weaker. This can make the bones weak and cause them to break more easily.  Blood pressure screening. Blood pressure changes and  medicines to control blood pressure can make you feel dizzy.  Strength and balance checks. Your health care provider may recommend certain tests to check your strength and balance while standing, walking, or changing positions.  Foot health exam. Foot pain and numbness, as well as not wearing proper footwear, can make you more likely to have a fall.  Depression screening. You may be more likely to have a fall if you have a fear of falling, feel emotionally low, or feel unable to do activities that you used to do.  Alcohol use screening. Using too much alcohol can affect your balance and may make you more likely to have a fall. What actions can I take to lower my risk of falls? General instructions  Talk with your health care provider about your risks for falling. Tell your health care provider if: ? You fall. Be sure to tell your health care provider about all falls, even ones that seem minor. ? You feel dizzy, sleepy, or off-balance.  Take over-the-counter and prescription medicines only as told by your health care provider. These include any supplements.  Eat a healthy diet and maintain a healthy weight. A healthy diet includes low-fat dairy products, low-fat (lean) meats, and fiber from whole grains, beans, and lots of fruits and vegetables. Home safety  Remove any tripping hazards, such as rugs, cords, and clutter.  Install safety equipment such as grab bars in bathrooms and safety rails on stairs.  Keep rooms and walkways well-lit. Activity   Follow a regular  exercise program to stay fit. This will help you maintain your balance. Ask your health care provider what types of exercise are appropriate for you.  If you need a cane or walker, use it as recommended by your health care provider.  Wear supportive shoes that have nonskid soles. Lifestyle  Do not drink alcohol if your health care provider tells you not to drink.  If you drink alcohol, limit how much you have: ? 0-1  drink a day for women. ? 0-2 drinks a day for men.  Be aware of how much alcohol is in your drink. In the [place] one drink equals one typical bottle of beer (12 oz), one-half glass of wine (5 oz), or one shot of hard liquor (1 oz).  Do not use any products that contain nicotine or tobacco, such as cigarettes and e-cigarettes. If you need help quitting, ask your health care provider. Summary  Having a healthy lifestyle and getting preventive care can help to protect your health and wellness after age 48.  Screening and testing are the best way to find a health problem early and help you avoid having a fall. Early diagnosis and treatment give you the best chance for managing medical conditions that are more common for people who are older than age 88.  Falls are a major cause of broken bones and head injuries in people who are older than age 54. Take precautions to prevent a fall at home.  Work with your health care provider to learn what changes you can make to improve your health and wellness and to prevent falls. This information is not intended to replace advice given to you by your health care provider. Make sure you discuss any questions you have with your health care provider. Document Revised: 09/02/2018 Document Reviewed: 03/25/2017 Elsevier Patient Education  2020 [employer].

## 2019-08-01 NOTE — Assessment & Plan Note (Signed)
Chronic, continues pantoprazole.

## 2019-08-01 NOTE — Assessment & Plan Note (Signed)
No recent gout flares since allopurinol started. He's been taking 1/2 dose QOD. Suggested he try 1/2 tab daily.

## 2019-08-01 NOTE — Assessment & Plan Note (Addendum)
Chronic, worse but stable. Only change is he takes lovastatin in am instead of pm. Suggested return to PM dosing, add Coq10.  The 10-year ASCVD risk score Mikey Bussing DC Brooke Bonito., et al., 2013) is: 46%   Values used to calculate the score:     Age: 74 years     Sex: Male     Is Non-Hispanic African American: No     Diabetic: Yes     Tobacco smoker: No     Systolic Blood Pressure: [id number] mmHg     Is BP treated: No     HDL Cholesterol: 40.3 mg/dL     Total Cholesterol: 198 mg/dL

## 2019-08-01 NOTE — Assessment & Plan Note (Signed)
Weight gain noted. Encouraged healthy diet and lifestyle choices.

## 2019-08-01 NOTE — Progress Notes (Signed)
This visit was conducted in person.  BP 136/78 (BP Location: Left Arm, Patient Position: Sitting, Cuff Size: Large)   Pulse 77   Temp 97.6 F (36.4 C) (Temporal)   Ht 5' 6.25" (1.683 m)   Wt 216 lb (98 kg)   SpO2 96%   BMI 34.60 kg/m    CC: AMW f/u visit Subjective:    Patient ID: [name], male    DOB: 10/30/1945, 74 y.o.   MRN: [id number]  HPI: [name] is a 74 y.o. male presenting on 08/01/2019 for Annual Exam (Prt 2.)   Saw health advisor last week for medicare wellness visit. Note reviewed.    No exam data present    Clinical Support from 07/27/2019 in [place] at [facility] Total Score  4      Fall Risk  07/27/2019 07/23/2018 07/17/2017 07/17/2017 07/16/2016  Falls in the past year? 0 0 No No No  Number falls in past yr: 0 - - - -  Injury with Fall? 0 - - - -  Risk for fall due to : No Fall Risks - - - -  Follow up Falls evaluation completed;Falls prevention discussed - - - -   BIL currently in hospice in [place], [place] due to stage 4 cancer.   Preventative: Colonoscopy 2008 - normal, rpt 10 yrs Amedeo [facility]enty, [place]).Cologuard negative 07/2016. Due for rpt.  Prostate -checkedyearly. Always normal. PSA reassuring. Nocturia x1.  Flu shot yearly Pneumovax 05/2010, prevnar 2016  Td 07/2009 zostavax - did not receive shingrix - discussed, no currently interested Covid vaccine - wants to wait for more data Advanced directives -wife [name]) would be [employer].Brought form, reviewed and scanned 06/2016. Seat belt use discussed.  Sunscreen usediscussed. Seeing derm for recent skin cancer treatment.  Non smoker  Alcohol - rare Dentist q3 mo Eye exam yearly Bowel - no diarrhea, constipation Bladder - noticing increasing urge incontinence   Caffeine: occaisonally, 2 diet cokes/day Married, lives with wife Retired: Development worker, community Activity: Walks and runs 3 miles a day at [place]. Golfing.  Diet: good water,  fruits/vegetables daily, avoiding carbs and watching portion sizes     Relevant past medical, surgical, family and social history reviewed and updated as indicated. Interim medical history since our last visit reviewed. Allergies and medications reviewed and updated. Outpatient Medications Prior to Visit  Medication Sig Dispense Refill  . acetaminophen (TYLENOL) 500 MG tablet Take 1,000 mg by mouth daily as needed for headache.     . albuterol (VENTOLIN HFA) 108 (90 Base) MCG/ACT inhaler INHALE TWO PUFFS INTO THE LUNGS EBVERY 6 HOURS AS NEEDED 18 g 3  . allopurinol (ZYLOPRIM) 100 MG tablet Take 1 tablet (100 mg total) by mouth daily. 30 tablet 11  . chlorhexidine (PERIDEX) 0.12 % solution chlorhexidine gluconate 0.12 % mouthwash    . colchicine 0.6 MG tablet Take 1 tablet (0.6 mg total) by mouth daily as needed (gout flare). First dose may take 2 tablets (1.2mg ) 30 tablet 3  . fluticasone (FLONASE) 50 MCG/ACT nasal spray USE 2 SPRAYS INTO EACH NOSTRIL ONCE DAILY AS NEEDED FOR CONGESTION 16 mL 9  . indomethacin (INDOCIN) 25 MG capsule Take 1 capsule (25 mg total) by mouth 2 (two) times daily as needed (gout flare). 60 capsule 0  . lovastatin (MEVACOR) 20 MG tablet TAKE 1 TABLET BY MOUTH EVERYDAY AT BEDTIME 90 tablet 3  . Multiple Vitamins-Minerals (MULTIVITAMIN ADULT PO) multivitamin    . Naproxen  Sodium (ALEVE PO) Take 2 tablets by mouth as needed.    . pantoprazole (PROTONIX) 40 MG tablet TAKE 1 TABLET BY MOUTH EVERY DAY 90 tablet 1  . sertraline (ZOLOFT) 100 MG tablet Take 100 mg by mouth daily.    . sildenafil (REVATIO) 20 MG tablet TAKE 2-5 TABLETS (40-100 MG TOTAL) BY MOUTH DAILY AS NEEDED. 45 tablet 1   No facility-administered medications prior to visit.     Per HPI unless specifically indicated in ROS section below Review of Systems  Constitutional: Negative for activity change, appetite change, chills, fatigue, fever and unexpected weight change.  HENT: Negative for hearing loss.    Eyes: Negative for visual disturbance.  Respiratory: Negative for cough, chest tightness, shortness of breath and wheezing.   Cardiovascular: Negative for chest pain, palpitations and leg swelling.  Gastrointestinal: Negative for abdominal distention, abdominal pain, blood in stool, constipation, diarrhea, nausea and vomiting.  Genitourinary: Negative for difficulty urinating and hematuria.  Musculoskeletal: Negative for arthralgias, myalgias and neck pain.  Skin: Negative for rash.  Neurological: Negative for dizziness, seizures, syncope and headaches.  Hematological: Negative for adenopathy. Does not bruise/bleed easily.  Psychiatric/Behavioral: Positive for dysphoric mood. The patient is not nervous/anxious.    Objective:    BP 136/78 (BP Location: Left Arm, Patient Position: Sitting, Cuff Size: Large)   Pulse 77   Temp 97.6 F (36.4 C) (Temporal)   Ht 5' 6.25" (1.683 m)   Wt 216 lb (98 kg)   SpO2 96%   BMI 34.60 kg/m   Wt Readings from Last 3 Encounters:  08/01/19 216 lb (98 kg)  07/27/19 210 lb (95.3 kg)  07/28/18 212 lb 9 oz (96.4 kg)    Physical Exam Vitals and nursing note reviewed.  Constitutional:      General: He is not in acute distress.    Appearance: Normal appearance. He is well-developed. He is obese. He is not ill-appearing.  HENT:     Head: Normocephalic and atraumatic.     Right Ear: Hearing, tympanic membrane, ear canal and external ear normal.     Left Ear: Hearing, tympanic membrane, ear canal and external ear normal.     Mouth/Throat:     Pharynx: Uvula midline.  Eyes:     General: No scleral icterus.    Extraocular Movements: Extraocular movements intact.     Conjunctiva/sclera: Conjunctivae normal.     Pupils: Pupils are equal, round, and reactive to light.  Cardiovascular:     Rate and Rhythm: Normal rate and regular rhythm.     Pulses: Normal pulses.          Radial pulses are 2+ on the right side and 2+ on the left side.     Heart sounds:  Normal heart sounds. No murmur.  Pulmonary:     Effort: Pulmonary effort is normal. No respiratory distress.     Breath sounds: Normal breath sounds. No wheezing, rhonchi or rales.  Abdominal:     General: Abdomen is flat. Bowel sounds are normal. There is no distension.     Palpations: Abdomen is soft. There is no mass.     Tenderness: There is no abdominal tenderness. There is no guarding or rebound.     Hernia: No hernia is present.  Genitourinary:    Prostate: Normal. Not enlarged (20gm), not tender and no nodules present.     Rectum: Normal. No mass, tenderness, anal fissure, external hemorrhoid or internal hemorrhoid. Normal anal tone.  Musculoskeletal:  General: Normal range of motion.     Cervical back: Normal range of motion and neck supple.     Right lower leg: No edema.     Left lower leg: No edema.  Lymphadenopathy:     Cervical: No cervical adenopathy.  Skin:    General: Skin is warm and dry.     Findings: No rash.  Neurological:     General: No focal deficit present.     Mental Status: He is alert and oriented to person, place, and time.     Comments: CN grossly intact, station and gait intact  Psychiatric:        Mood and Affect: Mood normal.        Behavior: Behavior normal.        Thought Content: Thought content normal.        Judgment: Judgment normal.       Results for orders placed or performed in visit on 07/26/19  Microalbumin / creatinine urine ratio  Result Value Ref Range   Microalb, Ur <0.7 0.0 - 1.9 mg/dL   Creatinine,U 105.6 mg/dL   Microalb Creat Ratio 0.7 0.0 - 30.0 mg/g  Uric acid  Result Value Ref Range   Uric Acid, Serum 7.1 4.0 - 7.8 mg/dL  PSA, Medicare  Result Value Ref Range   PSA 0.72 0.10 - 4.00 ng/ml  Hemoglobin A1c  Result Value Ref Range   Hgb A1c MFr Bld 5.6 4.6 - 6.5 %  Comprehensive metabolic panel  Result Value Ref Range   Sodium 142 135 - 145 mEq/L   Potassium 4.2 3.5 - 5.1 mEq/L   Chloride 104 96 - 112 mEq/L     CO2 29 19 - 32 mEq/L   Glucose, Bld 101 (H) 70 - 99 mg/dL   BUN 18 6 - 23 mg/dL   Creatinine, Ser 1.03 0.40 - 1.50 mg/dL   Total Bilirubin 0.6 0.2 - 1.2 mg/dL   Alkaline Phosphatase 80 39 - 117 U/L   AST 20 0 - 37 U/L   ALT 18 0 - 53 U/L   Total Protein 6.9 6.0 - 8.3 g/dL   Albumin 4.1 3.5 - 5.2 g/dL   GFR 70.43 >60.00 mL/min   Calcium 9.6 8.4 - 10.5 mg/dL  Lipid panel  Result Value Ref Range   Cholesterol 198 0 - 200 mg/dL   Triglycerides 123.0 0.0 - 149.0 mg/dL   HDL 40.30 >39.00 mg/dL   VLDL 24.6 0.0 - 40.0 mg/dL   LDL Cholesterol 134 (H) 0 - 99 mg/dL   Total CHOL/HDL Ratio 5    NonHDL 158.12    Depression screen [facility] 2/9 07/27/2019 07/23/2018 07/17/2017 07/17/2017 07/16/2016  Decreased Interest 2 2 1 1  0  Down, Depressed, Hopeless 2 2 1 1  0  PHQ - 2 Score 4 4 2 2  0  Altered sleeping 0 1 0 0 -  Tired, decreased energy 0 0 1 0 -  Change in appetite 0 0 1 1 -  Feeling bad or failure about yourself  0 1 0 1 -  Trouble concentrating 0 1 0 0 -  Moving slowly or fidgety/restless 0 1 0 0 -  Suicidal thoughts 0 0 0 0 -  PHQ-9 Score 4 8 4 4  -  Difficult doing work/chores Somewhat difficult Somewhat difficult Somewhat difficult Somewhat difficult -   Assessment & Plan:  This visit occurred during the SARS-CoV-2 public health emergency.  Safety protocols were in place, including screening questions prior to the visit,  additional usage of staff PPE, and extensive cleaning of exam room while observing appropriate contact time as indicated for disinfecting solutions.   Problem List Items Addressed This Visit    Systolic murmur    Not appreciated today.       PTSD (post-traumatic stress disorder)    Stable period on sertraline - through [place].  Has struggled with pandemic public health measures.       Obesity, Class I, BMI 30-34.9    Weight gain noted. Encouraged healthy diet and lifestyle choices.       HYPERCHOLESTEROLEMIA    Chronic, worse but stable. Only change is he takes  lovastatin in am instead of pm. Suggested return to PM dosing, add Coq10.  The 10-year ASCVD risk score Mikey Bussing DC Brooke Bonito., et al., 2013) is: 46%   Values used to calculate the score:     Age: 89 years     Sex: Male     Is Non-Hispanic African American: No     Diabetic: Yes     Tobacco smoker: No     Systolic Blood Pressure: [id number] mmHg     Is BP treated: No     HDL Cholesterol: 40.3 mg/dL     Total Cholesterol: 198 mg/dL       GERD (gastroesophageal reflux disease)    Chronic, continues pantoprazole.       Chronic gout    No recent gout flares since allopurinol started. He's been taking 1/2 dose QOD. Suggested he try 1/2 tab daily.          Meds ordered this encounter  Medications  . co-enzyme Q-10 50 MG capsule    Sig: Take 1 capsule (50 mg total) by mouth daily.    Dispense:      No orders of the defined types were placed in this encounter.   Patient instructions: We will sign you up for cologuard stool test.  Good to see you today, call us with questions.  Return as needed or in 1 year for next wellness visit.  Follow up plan: Return in about 1 year (around 07/31/2020) for medicare wellness visit, follow up visit.  Ria Bush, MD

## 2019-08-01 NOTE — Assessment & Plan Note (Signed)
Stable period on sertraline - through [place].  Has struggled with pandemic public health measures.

## 2019-08-01 NOTE — Assessment & Plan Note (Signed)
Not appreciated today.  

## 2019-08-02 ENCOUNTER — Telehealth: Payer: Self-pay

## 2019-08-02 NOTE — Telephone Encounter (Signed)
I don't remember asking for anything specific.  Can we check with patient to see what he wanted [place] to receive?

## 2019-08-02 NOTE — Telephone Encounter (Signed)
I received a call from [facility] at the [facility]. They received a medical release form for patient and Cristie Hem states that all that's listed on the medical release form is the date of 2005 - and she just wanted to see if there is anything specific that Dr. Darnell Level is requesting or any certain labs, office notes, etc.? Please advise.

## 2019-08-03 NOTE — Telephone Encounter (Signed)
Lvm asking pt to call back.  Need to find out what records he wanted Dr. Darnell Level to have from [facility].

## 2019-08-03 NOTE — Telephone Encounter (Signed)
Patient called back He stated that at his physical visit with Dr Darnell Level.  It was discussed a treatment he had done and thought Dr Darnell Level wanted the records. .  Patient said he is not exactly sure what needs to be requested.   Please advise

## 2019-08-04 NOTE — Telephone Encounter (Signed)
I see he had L thumb joint replacement in 2005, I don't need specific records from this. I think we're ok.

## 2019-08-04 NOTE — Telephone Encounter (Signed)
LVM

## 2019-08-05 NOTE — Telephone Encounter (Signed)
Noted.  Did some more research and found out pt was seen at [facility].  Called them and spoke with Sonia Baller requesting recent bx records.  Says she will fax them today.

## 2019-08-05 NOTE — Telephone Encounter (Addendum)
Spoke with pt relaying Dr. Synthia Innocent message.  Per pt, he and Dr. Darnell Level discussed skin bx and CA dx.  Says he was sent by [place] it is an ongoing thing.   Lvm with [facility] requesting records concerning info above from pt.

## 2019-08-05 NOTE — Telephone Encounter (Signed)
[facility] returned [name]'s call. They said they do not have these records anymore. They were stored on a computer that no longer works.

## 2019-08-08 DIAGNOSIS — Z1211 Encounter for screening for malignant neoplasm of colon: Secondary | ICD-10-CM | POA: Diagnosis not present

## 2019-08-08 DIAGNOSIS — Z1212 Encounter for screening for malignant neoplasm of rectum: Secondary | ICD-10-CM | POA: Diagnosis not present

## 2019-08-08 LAB — COLOGUARD: Cologuard: NEGATIVE

## 2019-08-15 LAB — COLOGUARD: COLOGUARD: NEGATIVE

## 2019-08-18 ENCOUNTER — Encounter: Payer: Self-pay | Admitting: Family Medicine

## 2019-08-25 ENCOUNTER — Encounter: Payer: Self-pay | Admitting: Family Medicine

## 2019-09-14 ENCOUNTER — Other Ambulatory Visit: Payer: Self-pay | Admitting: Family Medicine

## 2019-10-10 ENCOUNTER — Encounter: Payer: Self-pay | Admitting: Family Medicine

## 2019-11-08 DIAGNOSIS — H02831 Dermatochalasis of right upper eyelid: Secondary | ICD-10-CM | POA: Diagnosis not present

## 2019-11-08 DIAGNOSIS — H52203 Unspecified astigmatism, bilateral: Secondary | ICD-10-CM | POA: Diagnosis not present

## 2019-11-08 DIAGNOSIS — H02834 Dermatochalasis of left upper eyelid: Secondary | ICD-10-CM | POA: Diagnosis not present

## 2019-11-08 DIAGNOSIS — H43813 Vitreous degeneration, bilateral: Secondary | ICD-10-CM | POA: Diagnosis not present

## 2020-01-03 ENCOUNTER — Encounter: Payer: Self-pay | Admitting: Family Medicine

## 2020-03-08 ENCOUNTER — Ambulatory Visit (INDEPENDENT_AMBULATORY_CARE_PROVIDER_SITE_OTHER): Payer: Medicare Other

## 2020-03-08 ENCOUNTER — Other Ambulatory Visit: Payer: Self-pay

## 2020-03-08 DIAGNOSIS — Z23 Encounter for immunization: Secondary | ICD-10-CM | POA: Diagnosis not present

## 2020-03-10 ENCOUNTER — Other Ambulatory Visit: Payer: Self-pay | Admitting: Family Medicine

## 2020-05-26 DIAGNOSIS — S63279A Dislocation of unspecified interphalangeal joint of unspecified finger, initial encounter: Secondary | ICD-10-CM

## 2020-05-26 HISTORY — DX: Dislocation of unspecified interphalangeal joint of unspecified finger, initial encounter: S63.279A

## 2020-07-05 ENCOUNTER — Other Ambulatory Visit: Payer: Self-pay | Admitting: Family Medicine

## 2020-07-29 ENCOUNTER — Other Ambulatory Visit: Payer: Self-pay | Admitting: Family Medicine

## 2020-07-29 DIAGNOSIS — E7800 Pure hypercholesterolemia, unspecified: Secondary | ICD-10-CM

## 2020-07-29 DIAGNOSIS — M1A0790 Idiopathic chronic gout, unspecified ankle and foot, without tophus (tophi): Secondary | ICD-10-CM

## 2020-07-29 DIAGNOSIS — Z125 Encounter for screening for malignant neoplasm of prostate: Secondary | ICD-10-CM

## 2020-07-31 ENCOUNTER — Ambulatory Visit (INDEPENDENT_AMBULATORY_CARE_PROVIDER_SITE_OTHER): Payer: Medicare Other

## 2020-07-31 ENCOUNTER — Other Ambulatory Visit: Payer: Self-pay

## 2020-07-31 ENCOUNTER — Other Ambulatory Visit (INDEPENDENT_AMBULATORY_CARE_PROVIDER_SITE_OTHER): Payer: Medicare Other

## 2020-07-31 DIAGNOSIS — Z0000 Encounter for general adult medical examination without abnormal findings: Secondary | ICD-10-CM | POA: Diagnosis not present

## 2020-07-31 DIAGNOSIS — M1A0790 Idiopathic chronic gout, unspecified ankle and foot, without tophus (tophi): Secondary | ICD-10-CM

## 2020-07-31 DIAGNOSIS — Z125 Encounter for screening for malignant neoplasm of prostate: Secondary | ICD-10-CM

## 2020-07-31 DIAGNOSIS — E7800 Pure hypercholesterolemia, unspecified: Secondary | ICD-10-CM

## 2020-07-31 LAB — LIPID PANEL
Cholesterol: 167 mg/dL (ref 0–200)
HDL: 40.9 mg/dL (ref >39.00)
LDL Cholesterol: 101 mg/dL — ABNORMAL HIGH (ref 0–99)
NonHDL: 126.27
Total CHOL/HDL Ratio: 4
Triglycerides: 126 mg/dL (ref 0.0–149.0)
VLDL: 25.2 mg/dL (ref 0.0–40.0)

## 2020-07-31 LAB — PSA, MEDICARE: PSA: 0.8 ng/ml (ref 0.10–4.00)

## 2020-07-31 LAB — COMPREHENSIVE METABOLIC PANEL
ALT: 19 U/L (ref 0–53)
AST: 20 U/L (ref 0–37)
Albumin: 4.4 g/dL (ref 3.5–5.2)
Alkaline Phosphatase: 86 U/L (ref 39–117)
BUN: 18 mg/dL (ref 6–23)
CO2: 28 mEq/L (ref 19–32)
Calcium: 9.6 mg/dL (ref 8.4–10.5)
Chloride: 104 mEq/L (ref 96–112)
Creatinine, Ser: 1.06 mg/dL (ref 0.40–1.50)
GFR: 68.49 mL/min (ref >60.00)
Glucose, Bld: 97 mg/dL (ref 70–99)
Potassium: 4.2 mEq/L (ref 3.5–5.1)
Sodium: 141 mEq/L (ref 135–145)
Total Bilirubin: 0.7 mg/dL (ref 0.2–1.2)
Total Protein: 7.1 g/dL (ref 6.0–8.3)

## 2020-07-31 LAB — URIC ACID: Uric Acid, Serum: 7.3 mg/dL (ref 4.0–7.8)

## 2020-07-31 NOTE — Progress Notes (Signed)
PCP notes:  Health Maintenance: Covid- declined   Abnormal Screenings: none   Patient concerns: Urinary incontinence at times- frequency at nighttime   Nurse concerns: none   Next PCP appt.: 08/03/2020 @ 11:30 am

## 2020-07-31 NOTE — Progress Notes (Signed)
Subjective:   [name] is a 75 y.o. male who presents for Medicare Annual/Subsequent preventive examination.  Review of Systems: N/A      I connected with the patient today by telephone and verified that I am speaking with the correct person using two identifiers. Location patient: home Location nurse: work Persons participating in the telephone visit: patient, nurse.   I discussed the limitations, risks, security and privacy concerns of performing an evaluation and management service by telephone and the availability of in person appointments. I also discussed with the patient that there may be a patient responsible charge related to this service. The patient expressed understanding and verbally consented to this telephonic visit.        Cardiac Risk Factors include: advanced age (>90men, >87 women);male gender;Other (see comment), Risk factor comments: hypercholesterolemia     Objective:    Today's Vitals   There is no height or weight on file to calculate BMI.  Advanced Directives 07/31/2020 07/27/2019 07/23/2018 07/17/2017 08/13/2016 08/05/2016 07/18/2016  Does Patient Have a Medical Advance Directive? Yes Yes Yes Yes Yes Yes Yes  Type of Paramedic of [place];Living will [place];Living will [place];Living will [place];Living will [place];Living will [place];Living will -  Does patient want to make changes to medical advance directive? - - - No - Patient declined No - Patient declined No - Patient declined No - Patient declined  Copy of [place] in Chart? Yes - validated most recent copy scanned in chart (See row information) Yes - validated most recent copy scanned in chart (See row information) No - copy requested Yes No - copy requested Yes Yes  Pre-existing out of facility DNR order (yellow form or pink MOST form) - - - - - - -     Current Medications (verified) Outpatient Encounter Medications as of 07/31/2020  Medication Sig  . acetaminophen (TYLENOL) 500 MG tablet Take 1,000 mg by mouth daily as needed for headache.   . albuterol (VENTOLIN HFA) 108 (90 Base) MCG/ACT inhaler INHALE TWO PUFFS INTO THE LUNGS EBVERY 6 HOURS AS NEEDED  . allopurinol (ZYLOPRIM) 100 MG tablet Take 1 tablet (100 mg total) by mouth daily.  . chlorhexidine (PERIDEX) 0.12 % solution chlorhexidine gluconate 0.12 % mouthwash  . Cholecalciferol (VITAMIN D3) 125 MCG (5000 UT) CAPS Take by mouth.  . co-enzyme Q-10 50 MG capsule Take 1 capsule (50 mg total) by mouth daily.  . colchicine 0.6 MG tablet Take 1 tablet (0.6 mg total) by mouth daily as needed (gout flare). First dose may take 2 tablets (1.2mg )  . fluticasone (FLONASE) 50 MCG/ACT nasal spray USE 2 SPRAYS INTO EACH NOSTRIL ONCE DAILY AS NEEDED FOR CONGESTION  . indomethacin (INDOCIN) 25 MG capsule Take 1 capsule (25 mg total) by mouth 2 (two) times daily as needed (gout flare).  Marland Kitchen lovastatin (MEVACOR) 20 MG tablet TAKE 1 TABLET BY MOUTH EVERYDAY AT BEDTIME  . Misc Natural Products (OSTEO BI-FLEX ADV TRIPLE ST PO) Take by mouth.  . Multiple Vitamins-Minerals (MULTIVITAMIN ADULT PO) multivitamin  . Naproxen Sodium (ALEVE PO) Take 2 tablets by mouth as needed.  . pantoprazole (PROTONIX) 40 MG tablet TAKE 1 TABLET BY MOUTH EVERY DAY  . QUERCETIN PO Take by mouth.  . sertraline (ZOLOFT) 100 MG tablet Take 100 mg by mouth daily.  . sildenafil (REVATIO) 20 MG tablet TAKE TWO TO FIVE TABLETS BY MOUTH  DAILY AS NEEDED  . Zinc 50 MG TABS Take by mouth.   No facility-administered encounter medications on file as of 07/31/2020.    Allergies (verified) Prednisone   History: Past Medical History:  Diagnosis Date  . AR (allergic rhinitis)   . Arthritis    PAIN AND OA LEFT KNEE ;  S/P RIGHT TOTAL KNEE REPLACEMENT  . Complication of anesthesia    STATES SEVERE PAIN WAKING UP AFTER GENERAL  ANESTHESIA; AND FEELS BAD.  . ED (erectile dysfunction)   . GERD (gastroesophageal reflux disease)   . Gout    per pt  . History of kidney stones   . History of nephrolithiasis    seen Dr. [facility], last 02/2011  . Hypercholesterolemia   . RBBB (right bundle branch block)   . Squamous cell skin cancer 07/2019   L clavicle [facility]is [place])   Past Surgical History:  Procedure Laterality Date  . 2D echo  16/10   mild diastolic dysfunction, nl EF   . COLONOSCOPY  05/2006   normal, rpt 10 yrs Amedeo Plenty, [place])  . KNEE ARTHROSCOPY Right 2000  . L thumb joint replace Left 2005   GRAMIG   . [place]  . TOTAL HIP ARTHROPLASTY Right 08/13/2016   Procedure: RIGHT TOTAL HIP ARTHROPLASTY ANTERIOR APPROACH;  Surgeon: Gaynelle Arabian, MD;  Location: [facility];  Service: Orthopedics;  Laterality: Right;  . TOTAL KNEE ARTHROPLASTY Right 12/2010  . TOTAL KNEE ARTHROPLASTY Left 05/08/2014   Procedure: LEFT TOTAL KNEE ARTHROPLASTY;  Surgeon: Gearlean Alf, MD;  Location: [facility];  Service: Orthopedics;  Laterality: Left;   Family History  Problem Relation Age of Onset  . Cancer Father        lung and brain (smoker)  . Stroke Paternal Grandmother 18       stroke or heart attack (unsure)  . Cancer Mother        lung (smoker)  . Cancer Maternal Grandmother        leukemia  . CAD Neg Hx        unsure (see above)  . Diabetes Neg Hx    Social History   Socioeconomic History  . Marital status: Married    Spouse name: Not on file  . Number of children: Not on file  . Years of education: Not on file  . Highest education level: Not on file  Occupational History  . Not on file  Tobacco Use  . Smoking status: Never Smoker  . Smokeless tobacco: Never Used  Vaping Use  . Vaping Use: Never used  Substance and Sexual Activity  . Alcohol use: No  . Drug use: No  . Sexual activity: Yes  Other Topics Concern  . Not on file  Social History Narrative   Caffeine:  occaisonally, 2 diet cokes/day   Married, lives with wife   Retired: Development worker, community   Activity: Walks and runs 3 miles a day. at [place]. Stays active golfing.    Diet: good water, fruits/vegetables daily, avoiding carbs and watching portion sizes   Social Determinants of Health   Financial Resource Strain: Low Risk   . Difficulty of Paying Living Expenses: Not hard at all  Food Insecurity: No Food Insecurity  . Worried About Charity fundraiser in the Last Year: Never true  . Ran Out of Food in the Last Year: Never true  Transportation Needs: No Transportation Needs  . Lack of Transportation (Medical): No  . Lack of Transportation (  Non-Medical): No  Physical Activity: Sufficiently Active  . Days of Exercise per Week: 5 days  . Minutes of Exercise per Session: 50 min  Stress: Stress Concern Present  . Feeling of Stress : To some extent  Social Connections: Not on file    Tobacco Counseling Counseling given: Not Answered   Clinical Intake:  Pre-visit preparation completed: Yes  Pain : No/denies pain     Nutritional Risks: None Diabetes: No  How often do you need to have someone help you when you read instructions, pamphlets, or other written materials from your doctor or pharmacy?: 1 - Never What is the last grade level you completed in school?: 4 years of college  Diabetic: No Nutrition Risk Assessment:  Has the patient had any N/V/D within the last 2 months?  No  Does the patient have any non-healing wounds?  No  Has the patient had any unintentional weight loss or weight gain?  No   Diabetes:  Is the patient diabetic?  No  If diabetic, was a CBG obtained today?  N/A Did the patient bring in their glucometer from home?  N/A How often do you monitor your CBG's? N/A.   Financial Strains and Diabetes Management:  Are you having any financial strains with the device, your supplies or your medication? N/A.  Does the patient want to be seen by Chronic Care  Management for management of their diabetes?  N/A Would the patient like to be referred to a Nutritionist or for Diabetic Management?  N/A   Interpreter Needed?: No  Information entered by :: CJohnson, LPN   Activities of Daily Living In your present state of health, do you have any difficulty performing the following activities: 07/31/2020  Hearing? Y  Comment wears hearing aids  Vision? N  Difficulty concentrating or making decisions? Y  Walking or climbing stairs? N  Dressing or bathing? N  Doing errands, shopping? N  Preparing Food and eating ? N  Using the Toilet? N  In the past six months, have you accidently leaked urine? Y  Comment some leakage at times  Do you have problems with loss of bowel control? N  Managing your Medications? N  Managing your Finances? N  Housekeeping or managing your Housekeeping? N  Some recent data might be hidden    Patient Care Team: Ria Bush, MD as PCP - General (Family Medicine) Sharyne Peach, MD as Consulting Physician (Ophthalmology) Gaynelle Arabian, MD as Consulting Physician (Orthopedic Surgery)  Indicate any recent Medical Services you may have received from other than [facility] providers in the past year (date may be approximate).     Assessment:   This is a routine wellness examination for [place].  Hearing/Vision screen  Hearing Screening   125Hz  250Hz  500Hz  1000Hz  2000Hz  3000Hz  4000Hz  6000Hz  8000Hz   Right ear:           Left ear:           Vision Screening Comments: Patient gets annual eye exams   Dietary issues and exercise activities discussed: Current Exercise Habits: Structured exercise class, Type of exercise: strength training/weights, Time (Minutes): 45, Frequency (Times/Week): 5, Weekly Exercise (Minutes/Week): 225, Intensity: Moderate, Exercise limited by: None identified  Goals    . Increase physical activity     Starting 07/23/2018, I will continue to exercise for 90 minutes 6 days per week.     . Patient  Stated     07/27/2019, I will continue to go to my exercise class 5 days a  week for 45 minutes.     . Patient Stated     07/31/2020, I will continue to go to the [facility] 5 days a week and exercise for 45 minutes.       Depression Screen PHQ 2/9 Scores 07/31/2020 07/27/2019 07/23/2018 07/17/2017 07/17/2017 07/16/2016 06/29/2015  PHQ - 2 Score 0 4 4 2 2  0 0  PHQ- 9 Score 0 4 8 4 4  - -    Fall Risk Fall Risk  07/31/2020 07/27/2019 07/23/2018 07/17/2017 07/17/2017  Falls in the past year? 1 0 0 No No  Number falls in past yr: 0 0 - - -  Injury with Fall? 0 0 - - -  Risk for fall due to : Medication side effect No Fall Risks - - -  Follow up Falls evaluation completed;Falls prevention discussed Falls evaluation completed;Falls prevention discussed - - -    FALL RISK PREVENTION PERTAINING TO THE HOME:  Any stairs in or around the home? Yes  If so, are there any without handrails? No  Home free of loose throw rugs in walkways, pet beds, electrical cords, etc? Yes  Adequate lighting in your home to reduce risk of falls? Yes   ASSISTIVE DEVICES UTILIZED TO PREVENT FALLS:  Life alert? No  Use of a cane, walker or w/c? No  Grab bars in the bathroom? No  Shower chair or bench in shower? No  Elevated toilet seat or a handicapped toilet? No   TIMED UP AND GO:  Was the test performed? N/A telephone visit .    Cognitive Function: MMSE - Mini Mental State Exam 07/31/2020 07/27/2019 07/23/2018 07/17/2017 07/16/2016  Orientation to time 5 5 5 5 5   Orientation to Place 5 5 5 5 5   Registration 3 3 3 3 3   Attention/ Calculation 5 5 0 0 0  Recall 3 3 3 3 3   Language- name 2 objects - - 0 0 0  Language- repeat 1 1 1 1 1   Language- follow 3 step command - - 3 3 3   Language- read & follow direction - - 0 0 0  Write a sentence - - 0 0 0  Copy design - - 0 0 0  Total score - - 20 20 20   Mini Cog  Mini-Cog screen was completed. Maximum score is 22. A value of 0 denotes this part of the MMSE was not completed or the  patient failed this part of the Mini-Cog screening.       Immunizations Immunization History  Administered Date(s) Administered  . Fluad Quad(high Dose 65+) 03/08/2020  . Influenza Whole 02/20/2010, 02/26/2011  . Influenza, High Dose Seasonal PF 02/19/2017, 02/23/2018, 02/10/2019  . Influenza,inj,Quad PF,6+ Mos 02/23/2013, 03/22/2014, 03/12/2015, 03/11/2016  . Influenza,inj,quad, With Preservative 02/23/2018  . Pneumococcal Conjugate-13 06/21/2014  . Pneumococcal Polysaccharide-23 06/12/2010  . Td 07/24/2009    TDAP status: Due, Education has been provided regarding the importance of this vaccine. Advised may receive this vaccine at local pharmacy or Health Dept. Aware to provide a copy of the vaccination record if obtained from local pharmacy or Health Dept. Verbalized acceptance and understanding.  Flu Vaccine status: Up to date  Pneumococcal vaccine status: Up to date  Covid-19 vaccine status: declined   Qualifies for Shingles Vaccine? Yes   Zostavax completed No   Shingrix Completed: No.    Education has been provided regarding the importance of this vaccine. Patient has been advised to call insurance company to determine out of pocket expense if they  have not yet received this vaccine. Advised may also receive vaccine at local pharmacy or Health Dept. Verbalized acceptance and understanding.  Screening Tests Health Maintenance  Topic Date Due  . COVID-19 Vaccine (1) 08/16/2020 (Originally 09/22/1957)  . TETANUS/TDAP  07/26/2024 (Originally 07/25/2019)  . Fecal DNA (Cologuard)  08/08/2022  . INFLUENZA VACCINE  Completed  . Hepatitis C Screening  Completed  . PNA vac Low Risk Adult  Completed  . HPV VACCINES  Aged Out    Health Maintenance  There are no preventive care reminders to display for this patient.  Colorectal cancer screening: Type of screening: Cologuard. Completed 08/08/2019. Repeat every 3 years  Lung Cancer Screening: (Low Dose CT Chest recommended if Age  20-80 years, 30 pack-year currently smoking OR have quit w/in 15 years.) does not qualify.    Additional Screening:  Hepatitis C Screening: does qualify; Completed 06/26/2015  Vision Screening: Recommended annual ophthalmology exams for early detection of glaucoma and other disorders of the eye. Is the patient up to date with their annual eye exam?  Yes  Who is the provider or what is the name of the office in which the patient attends annual eye exams? Dr. Sharyne Peach If pt is not established with a provider, would they like to be referred to a provider to establish care? No .   Dental Screening: Recommended annual dental exams for proper oral hygiene  Community Resource Referral / Chronic Care Management: CRR required this visit?  No   CCM required this visit?  No      Plan:     I have personally reviewed and noted the following in the patient's chart:   . Medical and social history . Use of alcohol, tobacco or illicit drugs  . Current medications and supplements . Functional ability and status . Nutritional status . Physical activity . Advanced directives . List of other physicians . Hospitalizations, surgeries, and ER visits in previous 12 months . Vitals . Screenings to include cognitive, depression, and falls . Referrals and appointments  In addition, I have reviewed and discussed with patient certain preventive protocols, quality metrics, and best practice recommendations. A written personalized care plan for preventive services as well as general preventive health recommendations were provided to patient.   Due to this being a telephonic visit, the after visit summary with patients personalized plan was offered to patient via office or my-chart. Patient preferred to pick up at office at next visit or via mychart.   Andrez Grime, LPN   01/29/7472

## 2020-07-31 NOTE — Patient Instructions (Signed)
[name] , Thank you for taking time to come for your Medicare Wellness Visit. I appreciate your ongoing commitment to your health goals. Please review the following plan we discussed and let me know if I can assist you in the future.   Screening recommendations/referrals: Colonoscopy: Cologuard completed 08/08/2019, due 07/2022 Recommended yearly ophthalmology/optometry visit for glaucoma screening and checkup Recommended yearly dental visit for hygiene and checkup  Vaccinations: Influenza vaccine: Up to date, completed 03/08/2020, due 12/2020 Pneumococcal vaccine: Completed series Tdap vaccine: insurance  Shingles vaccine: due, check with your insurance regarding coverage if interested    Covid-19: declined  Advanced directives: copy in chart  Conditions/risks identified: hypercholesterolemia   Next appointment: Follow up in one year for your annual wellness visit.   Preventive Care 75 Years and Older, Male Preventive care refers to lifestyle choices and visits with your health care provider that can promote health and wellness. What does preventive care include?  A yearly physical exam. This is also called an annual well check.  Dental exams once or twice a year.  Routine eye exams. Ask your health care provider how often you should have your eyes checked.  Personal lifestyle choices, including:  Daily care of your teeth and gums.  Regular physical activity.  Eating a healthy diet.  Avoiding tobacco and drug use.  Limiting alcohol use.  Practicing safe sex.  Taking low doses of aspirin every day.  Taking vitamin and mineral supplements as recommended by your health care provider. What happens during an annual well check? The services and screenings done by your health care provider during your annual well check will depend on your age, overall health, lifestyle risk factors, and family history of disease. Counseling  Your health care provider may ask you  questions about your:  Alcohol use.  Tobacco use.  Drug use.  Emotional well-being.  Home and relationship well-being.  Sexual activity.  Eating habits.  History of falls.  Memory and ability to understand (cognition).  Work and work Statistician. Screening  You may have the following tests or measurements:  Height, weight, and BMI.  Blood pressure.  Lipid and cholesterol levels. These may be checked every 5 years, or more frequently if you are over 69 years old.  Skin check.  Lung cancer screening. You may have this screening every year starting at age 62 if you have a 30-pack-year history of smoking and currently smoke or have quit within the past 15 years.  Fecal occult blood test (FOBT) of the stool. You may have this test every year starting at age 40.  Flexible sigmoidoscopy or colonoscopy. You may have a sigmoidoscopy every 5 years or a colonoscopy every 10 years starting at age 65.  Prostate cancer screening. Recommendations will vary depending on your family history and other risks.  Hepatitis C blood test.  Hepatitis B blood test.  Sexually transmitted disease (STD) testing.  Diabetes screening. This is done by checking your blood sugar (glucose) after you have not eaten for a while (fasting). You may have this done every 1-3 years.  Abdominal aortic aneurysm (AAA) screening. You may need this if you are a current or former smoker.  Osteoporosis. You may be screened starting at age 76 if you are at high risk. Talk with your health care provider about your test results, treatment options, and if necessary, the need for more tests. Vaccines  Your health care provider may recommend certain vaccines, such as:  Influenza vaccine. This is recommended every year.  Tetanus, diphtheria, and acellular pertussis (Tdap, Td) vaccine. You may need a Td booster every 10 years.  Zoster vaccine. You may need this after age 66.  Pneumococcal 13-valent conjugate  (PCV13) vaccine. One dose is recommended after age 57.  Pneumococcal polysaccharide (PPSV23) vaccine. One dose is recommended after age 36. Talk to your health care provider about which screenings and vaccines you need and how often you need them. This information is not intended to replace advice given to you by your health care provider. Make sure you discuss any questions you have with your health care provider. Document Released: 06/08/2015 Document Revised: 01/30/2016 Document Reviewed: 03/13/2015 Elsevier Interactive Patient Education  2017 [place] Prevention in the Home Falls can cause injuries. They can happen to people of all ages. There are many things you can do to make your home safe and to help prevent falls. What can I do on the outside of my home?  Regularly fix the edges of walkways and driveways and fix any cracks.  Remove anything that might make you trip as you walk through a door, such as a raised step or threshold.  Trim any bushes or trees on the path to your home.  Use bright outdoor lighting.  Clear any walking paths of anything that might make someone trip, such as rocks or tools.  Regularly check to see if handrails are loose or broken. Make sure that both sides of any steps have handrails.  Any raised decks and porches should have guardrails on the edges.  Have any leaves, snow, or ice cleared regularly.  Use sand or salt on walking paths during winter.  Clean up any spills in your garage right away. This includes oil or grease spills. What can I do in the bathroom?  Use night lights.  Install grab bars by the toilet and in the tub and shower. Do not use towel bars as grab bars.  Use non-skid mats or decals in the tub or shower.  If you need to sit down in the shower, use a plastic, non-slip stool.  Keep the floor dry. Clean up any water that spills on the floor as soon as it happens.  Remove soap buildup in the tub or shower  regularly.  Attach bath mats securely with double-sided non-slip rug tape.  Do not have throw rugs and other things on the floor that can make you trip. What can I do in the bedroom?  Use night lights.  Make sure that you have a light by your bed that is easy to reach.  Do not use any sheets or blankets that are too big for your bed. They should not hang down onto the floor.  Have a firm chair that has side arms. You can use this for support while you get dressed.  Do not have throw rugs and other things on the floor that can make you trip. What can I do in the kitchen?  Clean up any spills right away.  Avoid walking on wet floors.  Keep items that you use a lot in easy-to-reach places.  If you need to reach something above you, use a strong step stool that has a grab bar.  Keep electrical cords out of the way.  Do not use floor polish or wax that makes floors slippery. If you must use wax, use non-skid floor wax.  Do not have throw rugs and other things on the floor that can make you trip. What can I do  with my stairs?  Do not leave any items on the stairs.  Make sure that there are handrails on both sides of the stairs and use them. Fix handrails that are broken or loose. Make sure that handrails are as long as the stairways.  Check any carpeting to make sure that it is firmly attached to the stairs. Fix any carpet that is loose or worn.  Avoid having throw rugs at the top or bottom of the stairs. If you do have throw rugs, attach them to the floor with carpet tape.  Make sure that you have a light switch at the top of the stairs and the bottom of the stairs. If you do not have them, ask someone to add them for you. What else can I do to help prevent falls?  Wear shoes that:  Do not have high heels.  Have rubber bottoms.  Are comfortable and fit you well.  Are closed at the toe. Do not wear sandals.  If you use a stepladder:  Make sure that it is fully  opened. Do not climb a closed stepladder.  Make sure that both sides of the stepladder are locked into place.  Ask someone to hold it for you, if possible.  Clearly mark and make sure that you can see:  Any grab bars or handrails.  First and last steps.  Where the edge of each step is.  Use tools that help you move around (mobility aids) if they are needed. These include:  Canes.  Walkers.  Scooters.  Crutches.  Turn on the lights when you go into a dark area. Replace any light bulbs as soon as they burn out.  Set up your furniture so you have a clear path. Avoid moving your furniture around.  If any of your floors are uneven, fix them.  If there are any pets around you, be aware of where they are.  Review your medicines with your doctor. Some medicines can make you feel dizzy. This can increase your chance of falling. Ask your doctor what other things that you can do to help prevent falls. This information is not intended to replace advice given to you by your health care provider. Make sure you discuss any questions you have with your health care provider.  Document Released: 03/08/2009 Document Revised: 10/18/2015 Document Reviewed: 06/16/2014 Elsevier Interactive Patient Education  2017 [employer].

## 2020-08-03 ENCOUNTER — Encounter: Payer: Self-pay | Admitting: Family Medicine

## 2020-08-03 ENCOUNTER — Other Ambulatory Visit: Payer: Self-pay

## 2020-08-03 ENCOUNTER — Ambulatory Visit (INDEPENDENT_AMBULATORY_CARE_PROVIDER_SITE_OTHER): Payer: Medicare Other | Admitting: Family Medicine

## 2020-08-03 VITALS — BP 140/70 | HR 76 | Temp 98.0°F | Ht 66.0 in | Wt 217.1 lb

## 2020-08-03 DIAGNOSIS — K219 Gastro-esophageal reflux disease without esophagitis: Secondary | ICD-10-CM | POA: Diagnosis not present

## 2020-08-03 DIAGNOSIS — E7800 Pure hypercholesterolemia, unspecified: Secondary | ICD-10-CM | POA: Diagnosis not present

## 2020-08-03 DIAGNOSIS — F4310 Post-traumatic stress disorder, unspecified: Secondary | ICD-10-CM

## 2020-08-03 DIAGNOSIS — M1A0790 Idiopathic chronic gout, unspecified ankle and foot, without tophus (tophi): Secondary | ICD-10-CM | POA: Diagnosis not present

## 2020-08-03 MED ORDER — PANTOPRAZOLE SODIUM 40 MG PO TBEC: 40.0000 mg | DELAYED_RELEASE_TABLET | Freq: Every day | ORAL | 3 refills | Status: DC

## 2020-08-03 MED ORDER — LOVASTATIN 20 MG PO TABS: 20.0000 mg | ORAL_TABLET | Freq: Every day | ORAL | 3 refills | Status: DC

## 2020-08-03 MED ORDER — ALLOPURINOL 100 MG PO TABS: 100.0000 mg | ORAL_TABLET | Freq: Every day | ORAL | 3 refills | Status: DC

## 2020-08-03 NOTE — Assessment & Plan Note (Signed)
Managed by the [facility]

## 2020-08-03 NOTE — Assessment & Plan Note (Signed)
Continue daily pantoprazole.  °

## 2020-08-03 NOTE — Assessment & Plan Note (Signed)
Chronic, stable on lovastatin - continue. The 10-year ASCVD risk score [name] DC [name]., et al., 2013) is: 50.7%   Values used to calculate the score:     Age: 75 years     Sex: Male     Is Non-Hispanic African American: No     Diabetic: Yes     Tobacco smoker: Yes     Systolic Blood Pressure: 431 mmHg     Is BP treated: No     HDL Cholesterol: 40.9 mg/dL     Total Cholesterol: 167 mg/dL

## 2020-08-03 NOTE — Assessment & Plan Note (Signed)
Encouraged healthy diet and lifestyle choices to affect sustainable weight loss. He is staying very active at [employer]

## 2020-08-03 NOTE — Progress Notes (Signed)
Patient ID: [name], male    DOB: 1945/09/30, 75 y.o.   MRN: [id number]  This visit was conducted in person.  BP 140/70   Pulse 76   Temp 98 F (36.7 C) (Temporal)   Ht 5\' 6"  (1.676 m)   Wt 217 lb 2 oz (98.5 kg)   SpO2 95%   BMI 35.04 kg/m    CC: AMW f/u visit  Subjective:   HPI: [name] is a 75 y.o. male presenting on 08/03/2020 for Annual Exam (Prt 2. )   Saw health advisor this week for medicare wellness visit. Note reviewed.    No exam data present  Flowsheet Row Clinical Support from 07/31/2020 in [place] at [facility] Total Score 0      Fall Risk  07/31/2020 07/27/2019 07/23/2018 07/17/2017 07/17/2017  Falls in the past year? 1 0 0 No No  Number falls in past yr: 0 0 - - -  Injury with Fall? 0 0 - - -  Risk for fall due to : Medication side effect No Fall Risks - - -  Follow up Falls evaluation completed;Falls prevention discussed Falls evaluation completed;Falls prevention discussed - - -   New hearing aides through the [facility].   Preventative: Colonoscopy 2008 - normal, rpt 10 yrs Amedeo Plenty, [place]).Cologuard negative 07/2016, 07/2019.  Prostate -checkedyearly. Always normal.PSA reassuring. nocturia x2 - has increased coffee at bedtime. Got K cup for Christmas. Notes increasing urgency. No weakening of stream.  Lung cancer screen - not eligible  Flushot yearly  COVID vaccine - declines  Pneumovax 05/2010, prevnar 2016  Td 07/2009 Shingrix - discussed, no currently interested  Advanced directives -wife [name]) would be [employer].Broughtform, reviewed and scanned 06/2016. Seat belt use discussed. Sunscreen usediscussed. Seeing derm for recent skin cancer treatment.  Non smoker  Alcohol - rare Dentist q3 mo Eye exam yearly Bowel - no diarrhea, constipation Bladder - noticing increasing urge incontinence   Caffeine: occaisonally, 2 diet cokes/day Married, lives with wife Retired:  Development worker, community Activity: Walks and runs 3 miles a day at [employer] 6d/wk. works out 3hr/day. Golfing.  Diet: good water, fruits/vegetables daily, avoiding carbs and watching portion sizes     Relevant past medical, surgical, family and social history reviewed and updated as indicated. Interim medical history since our last visit reviewed. Allergies and medications reviewed and updated. Outpatient Medications Prior to Visit  Medication Sig Dispense Refill  . acetaminophen (TYLENOL) 500 MG tablet Take 1,000 mg by mouth daily as needed for headache.     . albuterol (VENTOLIN HFA) 108 (90 Base) MCG/ACT inhaler INHALE TWO PUFFS INTO THE LUNGS EBVERY 6 HOURS AS NEEDED 18 g 3  . chlorhexidine (PERIDEX) 0.12 % solution chlorhexidine gluconate 0.12 % mouthwash    . Cholecalciferol (VITAMIN D3) 125 MCG (5000 UT) CAPS Take by mouth.    . co-enzyme Q-10 50 MG capsule Take 1 capsule (50 mg total) by mouth daily.    . colchicine 0.6 MG tablet Take 1 tablet (0.6 mg total) by mouth daily as needed (gout flare). First dose may take 2 tablets (1.2mg ) 30 tablet 3  . fluticasone (FLONASE) 50 MCG/ACT nasal spray USE 2 SPRAYS INTO EACH NOSTRIL ONCE DAILY AS NEEDED FOR CONGESTION 16 mL 9  . indomethacin (INDOCIN) 25 MG capsule Take 1 capsule (25 mg total) by mouth 2 (two) times daily as needed (gout flare). 60 capsule 0  . Misc Natural Products (OSTEO BI-FLEX ADV  TRIPLE ST PO) Take by mouth.    . Multiple Vitamins-Minerals (MULTIVITAMIN ADULT PO) multivitamin    . Naproxen Sodium (ALEVE PO) Take 2 tablets by mouth as needed.    Marland Kitchen QUERCETIN PO Take by mouth.    . sertraline (ZOLOFT) 100 MG tablet Take 100 mg by mouth daily.    . sildenafil (REVATIO) 20 MG tablet TAKE TWO TO FIVE TABLETS BY MOUTH DAILY AS NEEDED 45 tablet 1  . vitamin C (ASCORBIC ACID) 500 MG tablet Take 500 mg by mouth daily.    . Zinc 50 MG TABS Take by mouth.    Marland Kitchen allopurinol (ZYLOPRIM) 100 MG tablet Take 1 tablet (100 mg total) by mouth daily. 30 tablet 11   . lovastatin (MEVACOR) 20 MG tablet TAKE 1 TABLET BY MOUTH EVERYDAY AT BEDTIME 90 tablet 0  . pantoprazole (PROTONIX) 40 MG tablet TAKE 1 TABLET BY MOUTH EVERY DAY 90 tablet 3   No facility-administered medications prior to visit.     Per HPI unless specifically indicated in ROS section below Review of Systems Objective:  BP 140/70   Pulse 76   Temp 98 F (36.7 C) (Temporal)   Ht 5\' 6"  (1.676 m)   Wt 217 lb 2 oz (98.5 kg)   SpO2 95%   BMI 35.04 kg/m   Wt Readings from Last 3 Encounters:  08/03/20 217 lb 2 oz (98.5 kg)  08/01/19 216 lb (98 kg)  07/27/19 210 lb (95.3 kg)      Physical Exam Vitals and nursing note reviewed.  Constitutional:      General: He is not in acute distress.    Appearance: Normal appearance. He is well-developed. He is not ill-appearing.  HENT:     Head: Normocephalic and atraumatic.     Right Ear: Hearing, tympanic membrane, ear canal and external ear normal.     Left Ear: Hearing, tympanic membrane, ear canal and external ear normal.  Eyes:     General: No scleral icterus.    Extraocular Movements: Extraocular movements intact.     Conjunctiva/sclera: Conjunctivae normal.     Pupils: Pupils are equal, round, and reactive to light.  Neck:     Thyroid: No thyroid mass or thyromegaly.     Vascular: No carotid bruit.  Cardiovascular:     Rate and Rhythm: Normal rate and regular rhythm.     Pulses: Normal pulses.          Radial pulses are 2+ on the right side and 2+ on the left side.     Heart sounds: Normal heart sounds. No murmur heard.   Pulmonary:     Effort: Pulmonary effort is normal. No respiratory distress.     Breath sounds: Normal breath sounds. No wheezing, rhonchi or rales.  Abdominal:     General: Abdomen is flat. Bowel sounds are normal. There is no distension.     Palpations: Abdomen is soft. There is no mass.     Tenderness: There is no abdominal tenderness. There is no guarding or rebound.     Hernia: No hernia is present.   Musculoskeletal:        General: Normal range of motion.     Cervical back: Normal range of motion and neck supple.     Right lower leg: No edema.     Left lower leg: No edema.  Lymphadenopathy:     Cervical: No cervical adenopathy.  Skin:    General: Skin is warm and dry.  Findings: No rash.  Neurological:     General: No focal deficit present.     Mental Status: He is alert and oriented to person, place, and time.     Comments: CN grossly intact, station and gait intact  Psychiatric:        Mood and Affect: Mood normal.        Behavior: Behavior normal.        Thought Content: Thought content normal.        Judgment: Judgment normal.       Results for orders placed or performed in visit on 07/31/20  PSA, Medicare  Result Value Ref Range   PSA 0.80 0.10 - 4.00 ng/ml  Uric acid  Result Value Ref Range   Uric Acid, Serum 7.3 4.0 - 7.8 mg/dL  Comprehensive metabolic panel  Result Value Ref Range   Sodium 141 135 - 145 mEq/L   Potassium 4.2 3.5 - 5.1 mEq/L   Chloride 104 96 - 112 mEq/L   CO2 28 19 - 32 mEq/L   Glucose, Bld 97 70 - 99 mg/dL   BUN 18 6 - 23 mg/dL   Creatinine, Ser 1.06 0.40 - 1.50 mg/dL   Total Bilirubin 0.7 0.2 - 1.2 mg/dL   Alkaline Phosphatase 86 39 - 117 U/L   AST 20 0 - 37 U/L   ALT 19 0 - 53 U/L   Total Protein 7.1 6.0 - 8.3 g/dL   Albumin 4.4 3.5 - 5.2 g/dL   GFR 68.49 >60.00 mL/min   Calcium 9.6 8.4 - 10.5 mg/dL  Lipid panel  Result Value Ref Range   Cholesterol 167 0 - 200 mg/dL   Triglycerides 126.0 0.0 - 149.0 mg/dL   HDL 40.90 >39.00 mg/dL   VLDL 25.2 0.0 - 40.0 mg/dL   LDL Cholesterol 101 (H) 0 - 99 mg/dL   Total CHOL/HDL Ratio 4    NonHDL 126.27    Assessment & Plan:  This visit occurred during the SARS-CoV-2 public health emergency.  Safety protocols were in place, including screening questions prior to the visit, additional usage of staff PPE, and extensive cleaning of exam room while observing appropriate contact time as  indicated for disinfecting solutions.   Problem List Items Addressed This Visit    HYPERCHOLESTEROLEMIA - Primary    Chronic, stable on lovastatin - continue. The 10-year ASCVD risk score Mikey Bussing DC Brooke Bonito., et al., 2013) is: 50.7%   Values used to calculate the score:     Age: 34 years     Sex: Male     Is Non-Hispanic African American: No     Diabetic: Yes     Tobacco smoker: Yes     Systolic Blood Pressure: 409 mmHg     Is BP treated: No     HDL Cholesterol: 40.9 mg/dL     Total Cholesterol: 167 mg/dL       Relevant Medications   lovastatin (MEVACOR) 20 MG tablet   GERD (gastroesophageal reflux disease)    Continue daily pantoprazole.       Relevant Medications   pantoprazole (PROTONIX) 40 MG tablet   Severe obesity (BMI 35.0-39.9) with comorbidity ([place])    Encouraged healthy diet and lifestyle choices to affect sustainable weight loss. [facility]      Chronic gout    Chronic, stable on low dose allopurinol without recent gout flares. No changes recommended at this time.       PTSD (post-traumatic stress disorder)  Managed by the [facility] ordered this encounter  Medications  . allopurinol (ZYLOPRIM) 100 MG tablet    Sig: Take 1 tablet (100 mg total) by mouth daily.    Dispense:  90 tablet    Refill:  3  . lovastatin (MEVACOR) 20 MG tablet    Sig: Take 1 tablet (20 mg total) by mouth daily at 6 PM.    Dispense:  90 tablet    Refill:  3  . pantoprazole (PROTONIX) 40 MG tablet    Sig: Take 1 tablet (40 mg total) by mouth daily.    Dispense:  90 tablet    Refill:  3   No orders of the defined types were placed in this encounter.   Patient instructions: You are doing well today Continue regular exercise routine.  Continue current medicines Return as needed or in 1 year for next wellness visit and follow up.   Follow up plan: Return in about 1 year (around 08/03/2021) for medicare wellness visit, follow up visit.  Ria Bush, MD

## 2020-08-03 NOTE — Patient Instructions (Addendum)
You are doing well today Continue regular exercise routine.  Continue current medicines Return as needed or in 1 year for next wellness visit and follow up.   Health Maintenance After Age 76 After age 72, you are at a higher risk for certain long-term diseases and infections as well as injuries from falls. Falls are a major cause of broken bones and head injuries in people who are older than age 83. Getting regular preventive care can help to keep you healthy and well. Preventive care includes getting regular testing and making lifestyle changes as recommended by your health care provider. Talk with your health care provider about:  Which screenings and tests you should have. A screening is a test that checks for a disease when you have no symptoms.  A diet and exercise plan that is right for you. What should I know about screenings and tests to prevent falls? Screening and testing are the best ways to find a health problem early. Early diagnosis and treatment give you the best chance of managing medical conditions that are common after age 67. Certain conditions and lifestyle choices may make you more likely to have a fall. Your health care provider may recommend:  Regular vision checks. Poor vision and conditions such as cataracts can make you more likely to have a fall. If you wear glasses, make sure to get your prescription updated if your vision changes.  Medicine review. Work with your health care provider to regularly review all of the medicines you are taking, including over-the-counter medicines. Ask your health care provider about any side effects that may make you more likely to have a fall. Tell your health care provider if any medicines that you take make you feel dizzy or sleepy.  Osteoporosis screening. Osteoporosis is a condition that causes the bones to get weaker. This can make the bones weak and cause them to break more easily.  Blood pressure screening. Blood pressure changes  and medicines to control blood pressure can make you feel dizzy.  Strength and balance checks. Your health care provider may recommend certain tests to check your strength and balance while standing, walking, or changing positions.  Foot health exam. Foot pain and numbness, as well as not wearing proper footwear, can make you more likely to have a fall.  Depression screening. You may be more likely to have a fall if you have a fear of falling, feel emotionally low, or feel unable to do activities that you used to do.  Alcohol use screening. Using too much alcohol can affect your balance and may make you more likely to have a fall. What actions can I take to lower my risk of falls? General instructions  Talk with your health care provider about your risks for falling. Tell your health care provider if: ? You fall. Be sure to tell your health care provider about all falls, even ones that seem minor. ? You feel dizzy, sleepy, or off-balance.  Take over-the-counter and prescription medicines only as told by your health care provider. These include any supplements.  Eat a healthy diet and maintain a healthy weight. A healthy diet includes low-fat dairy products, low-fat (lean) meats, and fiber from whole grains, beans, and lots of fruits and vegetables. Home safety  Remove any tripping hazards, such as rugs, cords, and clutter.  Install safety equipment such as grab bars in bathrooms and safety rails on stairs.  Keep rooms and walkways well-lit. Activity  Follow a regular exercise program to stay  fit. This will help you maintain your balance. Ask your health care provider what types of exercise are appropriate for you.  If you need a cane or walker, use it as recommended by your health care provider.  Wear supportive shoes that have nonskid soles.   Lifestyle  Do not drink alcohol if your health care provider tells you not to drink.  If you drink alcohol, limit how much you  have: ? 0-1 drink a day for women. ? 0-2 drinks a day for men.  Be aware of how much alcohol is in your drink. In the [place] one drink equals one typical bottle of beer (12 oz), one-half glass of wine (5 oz), or one shot of hard liquor (1 oz).  Do not use any products that contain nicotine or tobacco, such as cigarettes and e-cigarettes. If you need help quitting, ask your health care provider. Summary  Having a healthy lifestyle and getting preventive care can help to protect your health and wellness after age 57.  Screening and testing are the best way to find a health problem early and help you avoid having a fall. Early diagnosis and treatment give you the best chance for managing medical conditions that are more common for people who are older than age 19.  Falls are a major cause of broken bones and head injuries in people who are older than age 44. Take precautions to prevent a fall at home.  Work with your health care provider to learn what changes you can make to improve your health and wellness and to prevent falls. This information is not intended to replace advice given to you by your health care provider. Make sure you discuss any questions you have with your health care provider. Document Revised: 09/02/2018 Document Reviewed: 03/25/2017 Elsevier Patient Education  2021 [employer].

## 2020-08-03 NOTE — Assessment & Plan Note (Signed)
Chronic, stable on low dose allopurinol without recent gout flares. No changes recommended at this time.

## 2020-08-28 ENCOUNTER — Other Ambulatory Visit: Payer: Self-pay | Admitting: Family Medicine

## 2020-09-07 DIAGNOSIS — Z96653 Presence of artificial knee joint, bilateral: Secondary | ICD-10-CM | POA: Diagnosis not present

## 2020-09-07 DIAGNOSIS — Z96641 Presence of right artificial hip joint: Secondary | ICD-10-CM | POA: Diagnosis not present

## 2020-09-25 ENCOUNTER — Other Ambulatory Visit: Payer: Self-pay | Admitting: Family Medicine

## 2020-09-25 NOTE — Telephone Encounter (Signed)
Refill request Sildenafil Last refill 03/10/20 #45/1 Last office visit 08/03/20 Upcoming appointment 08/09/21

## 2020-11-29 DIAGNOSIS — H10413 Chronic giant papillary conjunctivitis, bilateral: Secondary | ICD-10-CM | POA: Diagnosis not present

## 2020-11-29 DIAGNOSIS — H52203 Unspecified astigmatism, bilateral: Secondary | ICD-10-CM | POA: Diagnosis not present

## 2020-11-29 DIAGNOSIS — H5203 Hypermetropia, bilateral: Secondary | ICD-10-CM | POA: Diagnosis not present

## 2020-11-29 DIAGNOSIS — H02831 Dermatochalasis of right upper eyelid: Secondary | ICD-10-CM | POA: Diagnosis not present

## 2021-01-21 ENCOUNTER — Ambulatory Visit: Payer: Medicare Other | Admitting: Family Medicine

## 2021-02-07 DIAGNOSIS — Z23 Encounter for immunization: Secondary | ICD-10-CM | POA: Diagnosis not present

## 2021-03-05 ENCOUNTER — Ambulatory Visit (INDEPENDENT_AMBULATORY_CARE_PROVIDER_SITE_OTHER): Payer: Medicare Other | Admitting: Family Medicine

## 2021-03-05 ENCOUNTER — Other Ambulatory Visit: Payer: Self-pay

## 2021-03-05 ENCOUNTER — Ambulatory Visit (INDEPENDENT_AMBULATORY_CARE_PROVIDER_SITE_OTHER): Payer: Medicare Other

## 2021-03-05 ENCOUNTER — Encounter: Payer: Self-pay | Admitting: Family Medicine

## 2021-03-05 VITALS — BP 138/80 | HR 67 | Temp 97.9°F | Ht 66.0 in | Wt 216.4 lb

## 2021-03-05 DIAGNOSIS — M4722 Other spondylosis with radiculopathy, cervical region: Secondary | ICD-10-CM | POA: Insufficient documentation

## 2021-03-05 DIAGNOSIS — M542 Cervicalgia: Secondary | ICD-10-CM | POA: Diagnosis not present

## 2021-03-05 MED ORDER — CYCLOBENZAPRINE HCL 10 MG PO TABS: 5.0000 mg | ORAL_TABLET | Freq: Two times a day (BID) | ORAL | 0 refills | Status: DC | PRN

## 2021-03-05 MED ORDER — METHYLPREDNISOLONE 4 MG PO TBPK: ORAL_TABLET | ORAL | 0 refills | Status: DC

## 2021-03-05 NOTE — Progress Notes (Signed)
Patient ID: [name], male    DOB: 1946/03/13, 75 y.o.   MRN: [id number]  This visit was conducted in person.  BP 138/80   Pulse 67   Temp 97.9 F (36.6 C) (Temporal)   Ht 5\' 6"  (1.676 m)   Wt 216 lb 7 oz (98.2 kg)   SpO2 96%   BMI 34.93 kg/m    CC: neck pain  Subjective:   HPI: [name] is a 75 y.o. male presenting on 03/05/2021 for Neck Pain (C/o ongoing neck pain.  Started about 1.5 mos ago after shooting rifle at gun range and playing golf.  Pt accompanied by wife, [name]- temp 97.9.)   1.5 mo h/o R neck pain with radiation up R ear and down R shoulder that may have started after shooting high powered rifle at a gun range and/or playing golf (halfway through 18 holes). Worse pain when turning neck while driving.   Treating with motrin or aleve with benefit. Also tylenol, ice with benefit. Hasn't tried heating pad.  [facility] - some exercises aggravate neck.  Neck keeps popping.   No shooting pain down arms, numbness, paresthesias or weakness of arms.  Denies inciting trauma/injury or fall.      Relevant past medical, surgical, family and social history reviewed and updated as indicated. Interim medical history since our last visit reviewed. Allergies and medications reviewed and updated. Outpatient Medications Prior to Visit  Medication Sig Dispense Refill   acetaminophen (TYLENOL) 500 MG tablet Take 1,000 mg by mouth daily as needed for headache.      albuterol (VENTOLIN HFA) 108 (90 Base) MCG/ACT inhaler INHALE TWO PUFFS INTO THE LUNGS EVERY 6 HOURS AS NEEDED 18 each 3   allopurinol (ZYLOPRIM) 100 MG tablet Take 1 tablet (100 mg total) by mouth daily. 90 tablet 3   chlorhexidine (PERIDEX) 0.12 % solution chlorhexidine gluconate 0.12 % mouthwash     Cholecalciferol (VITAMIN D3) 125 MCG (5000 UT) CAPS Take by mouth.     co-enzyme Q-10 50 MG capsule Take 1 capsule (50 mg total) by mouth daily.     colchicine 0.6 MG tablet Take 1  tablet (0.6 mg total) by mouth daily as needed (gout flare). First dose may take 2 tablets (1.2mg ) 30 tablet 3   fluticasone (FLONASE) 50 MCG/ACT nasal spray USE 2 SPRAYS INTO EACH NOSTRIL ONCE DAILY AS NEEDED FOR CONGESTION 16 mL 9   indomethacin (INDOCIN) 25 MG capsule Take 1 capsule (25 mg total) by mouth 2 (two) times daily as needed (gout flare). 60 capsule 0   lovastatin (MEVACOR) 20 MG tablet Take 1 tablet (20 mg total) by mouth daily at 6 PM. 90 tablet 3   Misc Natural Products (OSTEO BI-FLEX ADV TRIPLE ST PO) Take by mouth.     Multiple Vitamins-Minerals (MULTIVITAMIN ADULT PO) multivitamin     Naproxen Sodium (ALEVE PO) Take 2 tablets by mouth as needed.     pantoprazole (PROTONIX) 40 MG tablet Take 1 tablet (40 mg total) by mouth daily. 90 tablet 3   QUERCETIN PO Take by mouth.     sertraline (ZOLOFT) 100 MG tablet Take 100 mg by mouth daily.     sildenafil (REVATIO) 20 MG tablet TAKE TWO TO FIVE TABLETS BY MOUTH DAILY AS NEEDED 45 tablet 1   vitamin C (ASCORBIC ACID) 500 MG tablet Take 500 mg by mouth daily.     Zinc 50 MG TABS Take by mouth.  No facility-administered medications prior to visit.     Per HPI unless specifically indicated in ROS section below Review of Systems  Objective:  BP 138/80   Pulse 67   Temp 97.9 F (36.6 C) (Temporal)   Ht 5\' 6"  (1.676 m)   Wt 216 lb 7 oz (98.2 kg)   SpO2 96%   BMI 34.93 kg/m   Wt Readings from Last 3 Encounters:  03/05/21 216 lb 7 oz (98.2 kg)  08/03/20 217 lb 2 oz (98.5 kg)  08/01/19 216 lb (98 kg)      Physical Exam Vitals and nursing note reviewed.  Constitutional:      Appearance: Normal appearance. He is not ill-appearing.  HENT:     Head: Normocephalic and atraumatic.     Right Ear: Tympanic membrane, ear canal and external ear normal. There is no impacted cerumen.     Left Ear: Tympanic membrane, ear canal and external ear normal. There is no impacted cerumen.  Neck:     Comments:  Limited ROM cervical neck  in extension and lateral rotation/lateral flexion R>L No midline cervical spine tenderness Point tender to palpation upper paracervical mm into occiput No pain at trapezius or rhomboids bilaterally Musculoskeletal:     Cervical back: Neck supple. Tenderness present. No rigidity.  Skin:    General: Skin is warm and dry.     Findings: No rash.  Neurological:     Mental Status: He is alert.     Comments:  5/5 strength BUE Grip strength intact Neg spurling bilaterally  Psychiatric:        Mood and Affect: Mood normal.        Behavior: Behavior normal.       Assessment & Plan:  This visit occurred during the SARS-CoV-2 public health emergency.  Safety protocols were in place, including screening questions prior to the visit, additional usage of staff PPE, and extensive cleaning of exam room while observing appropriate contact time as indicated for disinfecting solutions.   Problem List Items Addressed This Visit     Neck pain on right side - Primary    Anticipate cervical strain with component of cervical DDD however symptoms ongoing for >6 wks now. Will Rx medrol dosepak with steroid precautions and muscle relaxant with sedation precautions. Discussed ice/heat use as well as tylenol and NSAIDs.  If not better with above, discussed possible referral to PM&R for further treatment options.  Check cervical films given duration of symptoms. Pt agrees with plan.       Relevant Orders   DG Cervical Spine Complete     Meds ordered this encounter  Medications   cyclobenzaprine (FLEXERIL) 10 MG tablet    Sig: Take 0.5-1 tablets (5-10 mg total) by mouth 2 (two) times daily as needed for muscle spasms (sedation).    Dispense:  20 tablet    Refill:  0   methylPREDNISolone (MEDROL DOSEPAK) 4 MG TBPK tablet    Sig: Use as directed    Dispense:  21 tablet    Refill:  0   Orders Placed This Encounter  Procedures   DG Cervical Spine Complete    Standing Status:   Future    Standing  Expiration Date:   03/05/2022    Order Specific Question:   Reason for Exam (SYMPTOM  OR DIAGNOSIS REQUIRED)    Answer:   R sided neck pain x6 + wks in h/o osteoarthritis    Order Specific Question:   Preferred imaging location?  Answer:   [facility]     Patient Instructions  I think you have persistent neck strain.  Avoid any exercise that may aggravate neck pain.  Treat with medrol dosepak for the next week as well as may use muscle relaxant as needed  Neck xray today  Try heating pad to the neck.  Let us know if not better with above, next step likely referral to rehab doctor.   Follow up plan: Return if symptoms worsen or fail to improve.  Ria Bush, MD

## 2021-03-05 NOTE — Assessment & Plan Note (Addendum)
Anticipate cervical strain with component of cervical DDD however symptoms ongoing for >6 wks now. Will Rx medrol dosepak with steroid precautions and muscle relaxant with sedation precautions. Discussed ice/heat use as well as tylenol and NSAIDs.  If not better with above, discussed possible referral to PM&R for further treatment options.  Check cervical films given duration of symptoms. Pt agrees with plan.

## 2021-03-05 NOTE — Patient Instructions (Addendum)
I think you have persistent neck strain.  Avoid any exercise that may aggravate neck pain.  Treat with medrol dosepak for the next week as well as may use muscle relaxant as needed  Neck xray today  Try heating pad to the neck.  Let us know if not better with above, next step likely referral to rehab doctor.

## 2021-03-20 ENCOUNTER — Telehealth: Payer: Self-pay | Admitting: Family Medicine

## 2021-03-20 DIAGNOSIS — M542 Cervicalgia: Secondary | ICD-10-CM

## 2021-03-20 DIAGNOSIS — M4722 Other spondylosis with radiculopathy, cervical region: Secondary | ICD-10-CM

## 2021-03-20 NOTE — Telephone Encounter (Signed)
Referral placed for PM&R evaluation.

## 2021-03-20 NOTE — Telephone Encounter (Signed)
Spoke to patient by telephone and was advised that the steroid medication helped some while he was taking it. Patient stated that he is still having the pain. Patient stated that the pain comes and goes. Patient stated that he has more pain when moving a certain way. Patient stated that the pain is keeping him awake at night.  Patient stated when he takes Ibuprofen it helps the pain for several hours. Patient wants to know what the next step would be. Pharmacy [facility]

## 2021-03-20 NOTE — Telephone Encounter (Signed)
Pt wife called stating that pt saw Dr Darnell Level  for neck pain and Xray. Pt wife states that pt took a steroid pak for 6 days. Pt wife states that Dr Darnell Level said if it didn't get any better to call to set up appt for a spinal doctor for injection. Please advise.

## 2021-03-21 NOTE — Telephone Encounter (Signed)
Spoke with pt relaying Dr. G's message.  Pt verbalizes understanding and expresses his thanks.  

## 2021-03-28 NOTE — Telephone Encounter (Signed)
Pt wife called asking if Dr Darnell Level made an appointment for pt to get the steroid injection for the pain in his neck.

## 2021-03-28 NOTE — Telephone Encounter (Signed)
That's very strange. I placed referral for physical medicine and rehab (physiatrist) in this encounter however in chart it shows up as physical therapy.  I'm sorry I don't know how that got changed.  I want patient to see a physiatrist (PM&R). I have placed new referral.

## 2021-03-28 NOTE — Telephone Encounter (Signed)
Pt/pt's wife, [name], calling back wanting an update on pain referral.  States oral steroid helped for a little while but pain is back. Wants a call back.

## 2021-03-28 NOTE — Telephone Encounter (Signed)
Horris Latino called [place] from [facility] called and states that the patient declined appointment due to wanting an injection. That is not a service they provide. They had contacted patient to schedule with PT and patient declined.

## 2021-03-28 NOTE — Addendum Note (Signed)
Addended by: Ria Bush on: 03/28/2021 06:43 PM   Modules accepted: Orders

## 2021-04-02 ENCOUNTER — Telehealth: Payer: Self-pay | Admitting: Physical Medicine and Rehabilitation

## 2021-04-02 NOTE — Telephone Encounter (Signed)
Pt's wife calling asking that when Dr. Romona Curls referral sch'ing be made to call her number [phone].

## 2021-04-03 ENCOUNTER — Telehealth: Payer: Self-pay | Admitting: Physical Medicine and Rehabilitation

## 2021-04-03 NOTE — Telephone Encounter (Signed)
Pt called asking for a CB to see if the appt for tomorrow morning @ 10 is still available.

## 2021-04-04 ENCOUNTER — Other Ambulatory Visit: Payer: Self-pay

## 2021-04-04 ENCOUNTER — Ambulatory Visit (INDEPENDENT_AMBULATORY_CARE_PROVIDER_SITE_OTHER): Payer: Medicare Other | Admitting: Physical Medicine and Rehabilitation

## 2021-04-04 ENCOUNTER — Encounter: Payer: Self-pay | Admitting: Physical Medicine and Rehabilitation

## 2021-04-04 VITALS — BP 160/71 | HR 72

## 2021-04-04 DIAGNOSIS — M7918 Myalgia, other site: Secondary | ICD-10-CM

## 2021-04-04 DIAGNOSIS — M4722 Other spondylosis with radiculopathy, cervical region: Secondary | ICD-10-CM | POA: Diagnosis not present

## 2021-04-04 DIAGNOSIS — M542 Cervicalgia: Secondary | ICD-10-CM

## 2021-04-04 MED ORDER — DIAZEPAM 5 MG PO TABS: ORAL_TABLET | ORAL | 0 refills | Status: DC

## 2021-04-04 NOTE — Progress Notes (Signed)
[name] - 75 y.o. male MRN [id number]  Date of birth: [birth year]  Office Visit Note: Visit Date: 04/04/2021 PCP: Ria Bush, MD Referred by: Ria Bush, MD  Subjective: Chief Complaint  Patient presents with   Neck - Pain   Head - Pain   Right Shoulder - Pain   HPI: [name] is a 75 y.o. male who comes in today per the request of Dr. Ria Bush for evaluation of chronic, worsening and severe right sided neck pain radiating to shoulder.  Patient reports ongoing pain for several years.  Patient reports pain is exacerbated by movement and activity.  He describes the pain as a soreness and aching sensation, currently rates a 7 out of 10.  Patient reports some relief with home exercise regimen, use of ice/heat, rest and medications as needed.  Patient's recent cervical spine x-ray images exhibit moderate to severe degenerative changes and moderate neuroforaminal narrowing on the right at C3-4 and C4-5.  Patient states he is a very active person and exercises multiple times a week performing cardio activities and weight lifting as tolerated.  Patient states he recently noticed his pain is increasing while he was trying to play golf, he reports that he had difficulty swinging the golf club due to severe pain.  Patient states he is now having difficulty completing daily household tasks due to severe pain. Patient states Dr. Danise Mina recently prescribed Flexeril and Prednisone that did help to relieve his pain for several days, however he reports his pain relief was short-lived.  At this time patient has not had formal MRI imaging of his cervical spine.  Patient denies focal weakness, numbness and tingling.  Patient denies recent trauma or falls.  He has not had any specific night pain or left-sided complaints.  He has no cancer history.    Review of Systems  Musculoskeletal:  Positive for myalgias and neck pain.  Neurological:  Negative for tingling, sensory  change, focal weakness and weakness.  All other systems reviewed and are negative. Otherwise per HPI.  Assessment & Plan: Visit Diagnoses:    ICD-10-CM   1. Cervicalgia  M54.2 MR CERVICAL SPINE WO CONTRAST    2. Cervical spondylosis with radiculopathy  M47.22     3. Myofascial pain syndrome  M79.18        Plan: Findings:  Chronic, worsening and severe right-sided neck pain radiating to shoulder.  Patient continues to have excruciating pain despite good conservative therapy such as home exercise regimen, use of ice/heat, rest and medications as needed.  Patient's clinical presentation and exam are consistent with possible upper cervical radiculopathy versus facet mediated pain.  We also feel that there could be a myofascial component contributing to his pain.  We did discuss patient's recent cervical x-rays in detail today using images and spine model.  We feel the next step is to obtain cervical MRI images.  We also feel that patient would benefit from formal physical therapy sessions and offered to place a referral for him today, however patient states he would like to hold off on these treatment at this time and would like to proceed with cervical MRI.  Patient states that he does have anxiety and PTSD and is requesting an open MRI and also medication to help him relax before the procedure.  We did place an order for oral Valium to take preprocedure.  We we will have the patient follow-up with [place] after cervical MRI is obtained for review and also to  discuss further treatment options such as cervical epidural steroid injection.  No red flag symptoms noted upon exam today.     Meds & Orders:  Meds ordered this encounter  Medications   diazepam (VALIUM) 5 MG tablet    Sig: Take one tablet by mouth with food one hour prior to procedure. May repeat 30 minutes prior if needed.    Dispense:  2 tablet    Refill:  0    Order Specific Question:   Supervising Provider    Answer:   Magnus Sinning  [[id number]]    Orders Placed This Encounter  Procedures   MR CERVICAL SPINE WO CONTRAST    Follow-up: Return in about 1 week (around 04/11/2021) for follow up after cervical MRI is obtained for review.   Procedures: No procedures performed      Clinical History: CERVICAL SPINE - COMPLETE 4+ VIEW   COMPARISON:  None.   FINDINGS: There is straightening of the cervical spine. No acute fracture or listhesis. Vertebral body height has been preserved. There is intervertebral disc space narrowing and endplate remodeling throughout the cervical spine in keeping with changes of moderate to severe degenerative disc disease. The prevertebral soft tissues are not thickened. The spinal canal is widely patent. Oblique images demonstrate moderate neuroforaminal narrowing on the right at C3-4 and C4-5. Left neural foramina are not well profiled. Odontoid view is limited by obliquity and overlying osseous structures.   IMPRESSION: Diffuse moderate to severe degenerative disc disease. At least moderate neuroforaminal narrowing on the right at C3-4 and C4-5.     Electronically Signed   By: Fidela Salisbury M.D.   On: 03/07/2021 23:47   He reports that he has never smoked. He has never used smokeless tobacco.  Recent Labs    07/31/20 1028  LABURIC 7.3    Objective:  VS:  HT:    WT:   BMI:     BP: (!) 160/71  HR:72bpm  TEMP: ( )  RESP:  Physical Exam Vitals and nursing note reviewed.  HENT:     Head: Normocephalic and atraumatic.     Right Ear: External ear normal.     Left Ear: External ear normal.     Nose: Nose normal.     Mouth/Throat:     Mouth: Mucous membranes are moist.  Eyes:     Extraocular Movements: Extraocular movements intact.  Cardiovascular:     Rate and Rhythm: Normal rate.     Pulses: Normal pulses.  Pulmonary:     Effort: Pulmonary effort is normal.  Abdominal:     General: Abdomen is flat. There is no distension.  Musculoskeletal:        General:  Tenderness present.     Cervical back: Tenderness present.     Comments: Discomfort noted with flexion, extension and side-to-side rotation. Patient has good strength in the upper extremities including 5 out of 5 strength in wrist extension, long finger flexion and APB.  There is no atrophy of the hands intrinsically.  Sensation intact bilaterally. Dysesthesias noted to C5/C6 dermatomes. Negative Hoffman's sign. Equivocally positive Spurling's sign.    Skin:    General: Skin is warm and dry.     Capillary Refill: Capillary refill takes less than 2 seconds.  Neurological:     General: No focal deficit present.     Mental Status: He is alert and oriented to person, place, and time.     Cranial Nerves: No cranial nerve deficit.  Sensory: No sensory deficit.     Gait: Gait normal.  Psychiatric:        Mood and Affect: Mood normal.    Ortho Exam  Imaging: No results found.  Past Medical/Family/Surgical/Social History: Medications & Allergies reviewed per EMR, new medications updated. Patient Active Problem List   Diagnosis Date Noted   Cervical spondylosis with radiculopathy 03/05/2021   Arthrosis of shoulder region, right 08/24/2018   PTSD (post-traumatic stress disorder) 09/98/3382   Systolic murmur 50/53/9767   OA (osteoarthritis) of hip 04/23/2016   Chronic gout 11/30/2015   Actinic keratosis 06/29/2015   Severe obesity (BMI 35.0-39.9) with comorbidity ([place]) 06/29/2015   Advanced care planning/counseling discussion 06/21/2014   OA (osteoarthritis) of knee 05/08/2014   GERD (gastroesophageal reflux disease) 05/04/2014   RBBB 01/18/2014   Transient global amnesia 12/06/2012   Impaired fasting blood sugar 06/16/2011   Kidney stone 03/24/2011   Constipation 03/24/2011   HYPERCHOLESTEROLEMIA 04/16/2009   ALLERGIC RHINITIS 04/16/2009   Past Medical History:  Diagnosis Date   AR (allergic rhinitis)    Arthritis    PAIN AND OA LEFT KNEE ;  S/P RIGHT TOTAL KNEE REPLACEMENT    Complication of anesthesia    STATES SEVERE PAIN WAKING UP AFTER GENERAL ANESTHESIA; AND FEELS BAD.   ED (erectile dysfunction)    GERD (gastroesophageal reflux disease)    Gout    per pt   History of kidney stones    History of nephrolithiasis    seen Dr. [facility], last 02/2011   Hypercholesterolemia    RBBB (right bundle branch block)    Squamous cell skin cancer 07/2019   L clavicle Gloris [place])   Family History  Problem Relation Age of Onset   Cancer Father        lung and brain (smoker)   Stroke Paternal Grandmother 28       stroke or heart attack (unsure)   Cancer Mother        lung (smoker)   Cancer Maternal Grandmother        leukemia   CAD Neg Hx        unsure (see above)   Diabetes Neg Hx    Past Surgical History:  Procedure Laterality Date   2D echo  34/19   mild diastolic dysfunction, nl EF    COLONOSCOPY  05/2006   normal, rpt 10 yrs [facility], [place])   KNEE ARTHROSCOPY Right 2000   L thumb joint replace Left 2005   [place]   TOTAL HIP ARTHROPLASTY Right 08/13/2016   Procedure: RIGHT TOTAL HIP ARTHROPLASTY ANTERIOR APPROACH;  Surgeon: Gaynelle Arabian, MD;  Location: [facility];  Service: Orthopedics;  Laterality: Right;   TOTAL KNEE ARTHROPLASTY Right 12/2010   TOTAL KNEE ARTHROPLASTY Left 05/08/2014   Procedure: LEFT TOTAL KNEE ARTHROPLASTY;  Surgeon: Gearlean Alf, MD;  Location: [facility];  Service: Orthopedics;  Laterality: Left;   Social History   Occupational History   Not on file  Tobacco Use   Smoking status: Never   Smokeless tobacco: Never  Vaping Use   Vaping Use: Never used  Substance and Sexual Activity   Alcohol use: No   Drug use: No   Sexual activity: Yes

## 2021-04-04 NOTE — Progress Notes (Signed)
Pt state neck pain that travels up his head and down his right shoulder. Pt state turning his head makes the pain worse. Pt state he takes pain meds to help ease his pain.  Numeric Pain Rating Scale and Functional Assessment Average Pain 10 Pain Right Now 3 My pain is constant, sharp, dull, and aching Pain is worse with: standing, some activites, and Driving Pain improves with: medication   In the last MONTH (on 0-10 scale) has pain interfered with the following?  1. General activity like being  able to carry out your everyday physical activities such as walking, climbing stairs, carrying groceries, or moving a chair?  Rating(7)  2. Relation with others like being able to carry out your usual social activities and roles such as  activities at home, at work and in your community. Rating(8)  3. Enjoyment of life such that you have  been bothered by emotional problems such as feeling anxious, depressed or irritable?  Rating(9)

## 2021-04-08 ENCOUNTER — Ambulatory Visit: Payer: Medicare Other | Admitting: Physical Medicine and Rehabilitation

## 2021-04-15 ENCOUNTER — Telehealth: Payer: Self-pay | Admitting: Physical Medicine and Rehabilitation

## 2021-04-15 NOTE — Telephone Encounter (Signed)
Left message #1 to schedule MRI review. MRI scheduled for 12/8.

## 2021-04-17 NOTE — Telephone Encounter (Signed)
LM #2 to call and schedule OV to review MRI

## 2021-04-22 ENCOUNTER — Telehealth: Payer: Self-pay | Admitting: Physical Medicine and Rehabilitation

## 2021-04-22 NOTE — Telephone Encounter (Signed)
Pt's wife [name] called to set appt for MRI review. Please call pt at [phone].

## 2021-05-02 ENCOUNTER — Other Ambulatory Visit: Payer: Self-pay

## 2021-05-02 ENCOUNTER — Ambulatory Visit
Admission: RE | Admit: 2021-05-02 | Discharge: 2021-05-02 | Disposition: A | Discharge status: Home / Self Care | Payer: Medicare Other | Source: Ambulatory Visit | Attending: Physical Medicine and Rehabilitation | Admitting: Physical Medicine and Rehabilitation

## 2021-05-02 DIAGNOSIS — M542 Cervicalgia: Secondary | ICD-10-CM

## 2021-05-02 DIAGNOSIS — M4802 Spinal stenosis, cervical region: Secondary | ICD-10-CM | POA: Diagnosis not present

## 2021-05-03 ENCOUNTER — Other Ambulatory Visit: Payer: Self-pay | Admitting: Physical Medicine and Rehabilitation

## 2021-05-03 DIAGNOSIS — M542 Cervicalgia: Secondary | ICD-10-CM

## 2021-05-03 DIAGNOSIS — M4722 Other spondylosis with radiculopathy, cervical region: Secondary | ICD-10-CM

## 2021-05-07 ENCOUNTER — Encounter: Payer: Self-pay | Admitting: Physical Medicine and Rehabilitation

## 2021-05-07 ENCOUNTER — Ambulatory Visit: Payer: Self-pay

## 2021-05-07 ENCOUNTER — Other Ambulatory Visit: Payer: Self-pay

## 2021-05-07 ENCOUNTER — Ambulatory Visit (INDEPENDENT_AMBULATORY_CARE_PROVIDER_SITE_OTHER): Payer: Medicare Other | Admitting: Physical Medicine and Rehabilitation

## 2021-05-07 VITALS — BP 157/81 | HR 80

## 2021-05-07 DIAGNOSIS — M5412 Radiculopathy, cervical region: Secondary | ICD-10-CM | POA: Diagnosis not present

## 2021-05-07 MED ORDER — METHYLPREDNISOLONE ACETATE 80 MG/ML IJ SUSP
80.0000 mg | Freq: Once | INTRAMUSCULAR | Status: AC
  Administered 2021-05-07: 11:00:00 80 mg

## 2021-05-07 NOTE — Patient Instructions (Signed)

## 2021-05-07 NOTE — Progress Notes (Signed)
Pt state neck pain that travels up his head and down his right shoulder. Pt state turning his head makes the pain worse. Pt state he takes pain meds to help ease his pain.  Numeric Pain Rating Scale and Functional Assessment Average Pain 6   In the last MONTH (on 0-10 scale) has pain interfered with the following?  1. General activity like being  able to carry out your everyday physical activities such as walking, climbing stairs, carrying groceries, or moving a chair?  Rating(9)   +Driver, -BT, -Dye Allergies.

## 2021-05-08 ENCOUNTER — Other Ambulatory Visit: Payer: Self-pay | Admitting: Family Medicine

## 2021-05-08 NOTE — Telephone Encounter (Signed)
Refill request Sildenafil Last refill 09/26/20 #45/1 Last office visit 03/05/21 Upcoming appointment 10/14/21

## 2021-05-12 DIAGNOSIS — S63285A Dislocation of proximal interphalangeal joint of left ring finger, initial encounter: Secondary | ICD-10-CM | POA: Diagnosis not present

## 2021-05-14 NOTE — Procedures (Signed)
Cervical Epidural Steroid Injection - Interlaminar Approach with Fluoroscopic Guidance  Patient: [name]      Date of Birth: [birth year] MRN: [id number] PCP: Ria Bush, MD      Visit Date: 05/07/2021   Universal Protocol:    Date/Time: 12/20/226:10 AM  Consent Given By: the patient  Position: PRONE  Additional Comments: Vital signs were monitored before and after the procedure. Patient was prepped and draped in the usual sterile fashion. The correct patient, procedure, and site was verified.   Injection Procedure Details:   Procedure diagnoses: Cervical radiculopathy [M54.12]    Meds Administered:  Meds ordered this encounter  Medications   methylPREDNISolone acetate (DEPO-MEDROL) injection 80 mg     Laterality: Right  Location/Site: C7-T1  Needle: 3.5 in., 20 ga. Tuohy  Needle Placement: Paramedian epidural space  Findings:  -Comments: Excellent flow of contrast into the epidural space.  Procedure Details: Using a paramedian approach from the side mentioned above, the region overlying the inferior lamina was localized under fluoroscopic visualization and the soft tissues overlying this structure were infiltrated with 4 ml. of 1% Lidocaine without Epinephrine. A # 20 gauge, Tuohy needle was inserted into the epidural space using a paramedian approach.  The epidural space was localized using loss of resistance along with contralateral oblique bi-planar fluoroscopic views.  After negative aspirate for air, blood, and CSF, a 2 ml. volume of Isovue-250 was injected into the epidural space and the flow of contrast was observed. Radiographs were obtained for documentation purposes.   The injectate was administered into the level noted above.  Additional Comments:  The patient tolerated the procedure well Dressing: 2 x 2 sterile gauze and Band-Aid    Post-procedure details: Patient was observed during the procedure. Post-procedure instructions were  reviewed.  Patient left the clinic in stable condition.

## 2021-05-14 NOTE — Progress Notes (Signed)
[name] - 75 y.o. male MRN [id number]  Date of birth: 1946/02/20  Office Visit Note: Visit Date: 05/07/2021 PCP: Ria Bush, MD Referred by: Ria Bush, MD  Subjective: Chief Complaint  Patient presents with   Neck - Pain   Right Shoulder - Pain   HPI:  [name] is a 75 y.o. male who comes in today at the request of Barnet Pall, FNP for planned Right C7-T1 Cervical Interlaminar epidural steroid injection with fluoroscopic guidance.  The patient has failed conservative care including home exercise, medications, time and activity modification.  This injection will be diagnostic and hopefully therapeutic.  Please see requesting physician notes for further details and justification.  ROS Otherwise per HPI.  Assessment & Plan: Visit Diagnoses:    ICD-10-CM   1. Cervical radiculopathy  M54.12 XR C-ARM NO REPORT    Epidural Steroid injection    methylPREDNISolone acetate (DEPO-MEDROL) injection 80 mg      Plan: No additional findings.   Meds & Orders:  Meds ordered this encounter  Medications   methylPREDNISolone acetate (DEPO-MEDROL) injection 80 mg    Orders Placed This Encounter  Procedures   XR C-ARM NO REPORT   Epidural Steroid injection    Follow-up: Return if symptoms worsen or fail to improve.   Procedures: No procedures performed  Cervical Epidural Steroid Injection - Interlaminar Approach with Fluoroscopic Guidance  Patient: [name]      Date of Birth: 1945-07-10 MRN: [id number] PCP: Ria Bush, MD      Visit Date: 05/07/2021   Universal Protocol:    Date/Time: 12/20/226:10 AM  Consent Given By: the patient  Position: PRONE  Additional Comments: Vital signs were monitored before and after the procedure. Patient was prepped and draped in the usual sterile fashion. The correct patient, procedure, and site was verified.   Injection Procedure Details:   Procedure diagnoses: Cervical radiculopathy  [M54.12]    Meds Administered:  Meds ordered this encounter  Medications   methylPREDNISolone acetate (DEPO-MEDROL) injection 80 mg     Laterality: Right  Location/Site: C7-T1  Needle: 3.5 in., 20 ga. Tuohy  Needle Placement: Paramedian epidural space  Findings:  -Comments: Excellent flow of contrast into the epidural space.  Procedure Details: Using a paramedian approach from the side mentioned above, the region overlying the inferior lamina was localized under fluoroscopic visualization and the soft tissues overlying this structure were infiltrated with 4 ml. of 1% Lidocaine without Epinephrine. A # 20 gauge, Tuohy needle was inserted into the epidural space using a paramedian approach.  The epidural space was localized using loss of resistance along with contralateral oblique bi-planar fluoroscopic views.  After negative aspirate for air, blood, and CSF, a 2 ml. volume of Isovue-250 was injected into the epidural space and the flow of contrast was observed. Radiographs were obtained for documentation purposes.   The injectate was administered into the level noted above.  Additional Comments:  The patient tolerated the procedure well Dressing: 2 x 2 sterile gauze and Band-Aid    Post-procedure details: Patient was observed during the procedure. Post-procedure instructions were reviewed.  Patient left the clinic in stable condition.   Clinical History: CERVICAL SPINE - COMPLETE 4+ VIEW   COMPARISON:  None.   FINDINGS: There is straightening of the cervical spine. No acute fracture or listhesis. Vertebral body height has been preserved. There is intervertebral disc space narrowing and endplate remodeling throughout the cervical spine in keeping with changes of moderate to severe degenerative  disc disease. The prevertebral soft tissues are not thickened. The spinal canal is widely patent. Oblique images demonstrate moderate neuroforaminal narrowing on the right at  C3-4 and C4-5. Left neural foramina are not well profiled. Odontoid view is limited by obliquity and overlying osseous structures.   IMPRESSION: Diffuse moderate to severe degenerative disc disease. At least moderate neuroforaminal narrowing on the right at C3-4 and C4-5.     Electronically Signed   By: Fidela Salisbury M.D.   On: 03/07/2021 23:47     Objective:  VS:  HT:     WT:    BMI:      BP:(!) 157/81   HR:80bpm   TEMP: ( )   RESP:  Physical Exam Vitals and nursing note reviewed.  Constitutional:      General: He is not in acute distress.    Appearance: Normal appearance. He is not ill-appearing.  HENT:     Head: Normocephalic and atraumatic.     Right Ear: External ear normal.     Left Ear: External ear normal.  Eyes:     Extraocular Movements: Extraocular movements intact.  Cardiovascular:     Rate and Rhythm: Normal rate.     Pulses: Normal pulses.  Abdominal:     General: There is no distension.     Palpations: Abdomen is soft.  Musculoskeletal:        General: No signs of injury.     Cervical back: Neck supple. Tenderness present. No rigidity.     Right lower leg: No edema.     Left lower leg: No edema.     Comments: Patient has good strength in the upper extremities with 5 out of 5 strength in wrist extension long finger flexion APB.  No intrinsic hand muscle atrophy.  Negative Hoffmann's test.  Lymphadenopathy:     Cervical: No cervical adenopathy.  Skin:    Findings: No erythema or rash.  Neurological:     General: No focal deficit present.     Mental Status: He is alert and oriented to person, place, and time.     Sensory: No sensory deficit.     Motor: No weakness or abnormal muscle tone.     Coordination: Coordination normal.  Psychiatric:        Mood and Affect: Mood normal.        Behavior: Behavior normal.     Imaging: No results found.

## 2021-05-21 DIAGNOSIS — S63285A Dislocation of proximal interphalangeal joint of left ring finger, initial encounter: Secondary | ICD-10-CM | POA: Diagnosis not present

## 2021-07-31 NOTE — Progress Notes (Signed)
Subjective:   [name] is a 76 y.o. male who presents for Medicare Annual/Subsequent preventive examination.  I connected with [name] today by telephone and verified that I am speaking with the correct person using two identifiers. Location patient: home Location provider: work Persons participating in the virtual visit: patient, Marine scientist.    I discussed the limitations, risks, security and privacy concerns of performing an evaluation and management service by telephone and the availability of in person appointments. I also discussed with the patient that there may be a patient responsible charge related to this service. The patient expressed understanding and verbally consented to this telephonic visit.    Interactive audio and video telecommunications were attempted between this provider and patient, however failed, due to patient having technical difficulties OR patient did not have access to video capability.  We continued and completed visit with audio only.  Some vital signs may be absent or patient reported.   Time Spent with patient on telephone encounter: 20 minutes  Review of Systems     Cardiac Risk Factors include: advanced age (>54mn, >>63women)     Objective:    Today's Vitals   08/01/21 1118  Weight: 216 lb (98 kg)  Height: '5\' 6"'$  (1.676 m)   Body mass index is 34.86 kg/m.  Advanced Directives 08/01/2021 07/31/2020 07/27/2019 07/23/2018 07/17/2017 08/13/2016 08/05/2016  Does Patient Have a Medical Advance Directive? Yes Yes Yes Yes Yes Yes Yes  Type of AParamedicof A[place]dy[place] WestLiving will HW[place]Living will HSuccessLiving will HTamaLiving will HDanielsonLiving will HCatawbaLiving will HBrockLiving will  Does patient want to make changes to medical advance directive? Yes (MAU/Ambulatory/Procedural Areas -  Information given) - - - No - Patient declined No - Patient declined No - Patient declined  Copy of [place] Chart? - Yes - validated most recent copy scanned in chart (See row information) Yes - validated most recent copy scanned in chart (See row information) No - copy requested Yes No - copy requested Yes  Pre-existing out of facility DNR order (yellow form or pink MOST form) - - - - - - -    Current Medications (verified) Outpatient Encounter Medications as of 08/01/2021  Medication Sig   acetaminophen (TYLENOL) 500 MG tablet Take 1,000 mg by mouth daily as needed for headache.    albuterol (VENTOLIN HFA) 108 (90 Base) MCG/ACT inhaler INHALE TWO PUFFS INTO THE LUNGS EVERY 6 HOURS AS NEEDED   allopurinol (ZYLOPRIM) 100 MG tablet Take 1 tablet (100 mg total) by mouth daily.   Cholecalciferol (VITAMIN D3) 125 MCG (5000 UT) CAPS Take by mouth.   co-enzyme Q-10 50 MG capsule Take 1 capsule (50 mg total) by mouth daily.   colchicine 0.6 MG tablet Take 1 tablet (0.6 mg total) by mouth daily as needed (gout flare). First dose may take 2 tablets (1.'2mg'$ )   fluticasone (FLONASE) 50 MCG/ACT nasal spray USE 2 SPRAYS INTO EACH NOSTRIL ONCE DAILY AS NEEDED FOR CONGESTION   indomethacin (INDOCIN) 25 MG capsule Take 1 capsule (25 mg total) by mouth 2 (two) times daily as needed (gout flare).   lovastatin (MEVACOR) 20 MG tablet Take 1 tablet (20 mg total) by mouth daily at 6 PM.   Misc Natural Products (OSTEO BI-FLEX ADV TRIPLE ST PO) Take by mouth.   Multiple Vitamins-Minerals (MULTIVITAMIN ADULT PO) multivitamin   Naproxen Sodium (  ALEVE PO) Take 2 tablets by mouth as needed.   pantoprazole (PROTONIX) 40 MG tablet Take 1 tablet (40 mg total) by mouth daily.   QUERCETIN PO Take by mouth.   sertraline (ZOLOFT) 100 MG tablet Take 100 mg by mouth daily.   sildenafil (REVATIO) 20 MG tablet TAKE 2-5 TABLETS DAILY AS NEEDED   vitamin C (ASCORBIC ACID) 500 MG tablet Take 500 mg by mouth  daily.   Zinc 50 MG TABS Take by mouth.   chlorhexidine (PERIDEX) 0.12 % solution chlorhexidine gluconate 0.12 % mouthwash (Patient not taking: Reported on 08/01/2021)   cyclobenzaprine (FLEXERIL) 10 MG tablet Take 0.5-1 tablets (5-10 mg total) by mouth 2 (two) times daily as needed for muscle spasms (sedation). (Patient not taking: Reported on 08/01/2021)   diazepam (VALIUM) 5 MG tablet Take one tablet by mouth with food one hour prior to procedure. May repeat 30 minutes prior if needed. (Patient not taking: Reported on 08/01/2021)   methylPREDNISolone (MEDROL DOSEPAK) 4 MG TBPK tablet Use as directed (Patient not taking: Reported on 08/01/2021)   No facility-administered encounter medications on file as of 08/01/2021.    Allergies (verified) Prednisone   History: Past Medical History:  Diagnosis Date   AR (allergic rhinitis)    Arthritis    PAIN AND OA LEFT KNEE ;  S/P RIGHT TOTAL KNEE REPLACEMENT   Complication of anesthesia    STATES SEVERE PAIN WAKING UP AFTER GENERAL ANESTHESIA; AND FEELS BAD.   ED (erectile dysfunction)    GERD (gastroesophageal reflux disease)    Gout    per pt   History of kidney stones    History of nephrolithiasis    seen Dr. [facility], last 02/2011   Hypercholesterolemia    RBBB (right bundle branch block)    Squamous cell skin cancer 07/2019   L clavicle [facility] [place])   Past Surgical History:  Procedure Laterality Date   2D echo  93/23   mild diastolic dysfunction, nl EF    COLONOSCOPY  05/2006   normal, rpt 10 yrs [facility]eo [facility], [place])   KNEE ARTHROSCOPY Right 2000   L thumb joint replace Left 2005   [place]   TOTAL HIP ARTHROPLASTY Right 08/13/2016   Procedure: RIGHT TOTAL HIP ARTHROPLASTY ANTERIOR APPROACH;  Surgeon: Gaynelle Arabian, MD;  Location: [facility];  Service: Orthopedics;  Laterality: Right;   TOTAL KNEE ARTHROPLASTY Right 12/2010   TOTAL KNEE ARTHROPLASTY Left 05/08/2014   Procedure: LEFT TOTAL KNEE  ARTHROPLASTY;  Surgeon: Gearlean Alf, MD;  Location: [facility];  Service: Orthopedics;  Laterality: Left;   Family History  Problem Relation Age of Onset   Cancer Father        lung and brain (smoker)   Stroke Paternal Grandmother 68       stroke or heart attack (unsure)   Cancer Mother        lung (smoker)   Cancer Maternal Grandmother        leukemia   CAD Neg Hx        unsure (see above)   Diabetes Neg Hx    Social History   Socioeconomic History   Marital status: Married    Spouse name: Not on file   Number of children: Not on file   Years of education: Not on file   Highest education level: Not on file  Occupational History   Not on file  Tobacco Use   Smoking status: Never  Smokeless tobacco: Never  Vaping Use   Vaping Use: Never used  Substance and Sexual Activity   Alcohol use: No   Drug use: No   Sexual activity: Yes  Other Topics Concern   Not on file  Social History Narrative   Caffeine: occaisonally, 2 diet cokes/day   Married, lives with wife   Retired: Development worker, community   Activity: Walks and runs 3 miles a day.  at [employer]. Stays active golfing.    Diet: good water, fruits/vegetables daily, avoiding carbs and watching portion sizes   Social Determinants of Health   Financial Resource Strain: Low Risk    Difficulty of Paying Living Expenses: Not hard at all  Food Insecurity: No Food Insecurity   Worried About Charity fundraiser in the Last Year: Never true   Ran Out of Food in the Last Year: Never true  Transportation Needs: No Transportation Needs   Lack of Transportation (Medical): No   Lack of Transportation (Non-Medical): No  Physical Activity: Sufficiently Active   Days of Exercise per Week: 5 days   Minutes of Exercise per Session: 120 min  Stress: No Stress Concern Present   Feeling of Stress : Not at all  Social Connections: Socially Integrated   Frequency of Communication with Friends and Family: More than three times a week   Frequency of  Social Gatherings with Friends and Family: Twice a week   Attends Religious Services: More than 4 times per year   Active Member of [employer] or Organizations: Yes   Attends Music therapist: More than 4 times per year   Marital Status: Married    Tobacco Counseling Counseling given: Not Answered   Clinical Intake:  Pre-visit preparation completed: Yes  Pain : No/denies pain     BMI - recorded: 34.86 Nutritional Status: BMI > 30  Obese Diabetes: No  How often do you need to have someone help you when you read instructions, pamphlets, or other written materials from your doctor or pharmacy?: 1 - Never  Diabetic?No  Interpreter Needed?: No  Information entered by :: Orrin Brigham LPN   Activities of Daily Living In your present state of health, do you have any difficulty performing the following activities: 08/01/2021  Hearing? Y  Vision? N  Difficulty concentrating or making decisions? N  Walking or climbing stairs? N  Dressing or bathing? N  Doing errands, shopping? N  Preparing Food and eating ? N  Using the Toilet? N  In the past six months, have you accidently leaked urine? N  Do you have problems with loss of bowel control? N  Managing your Medications? N  Managing your Finances? N  Housekeeping or managing your Housekeeping? N  Some recent data might be hidden    Patient Care Team: Ria Bush, MD as PCP - General (Family Medicine) Sharyne Peach, MD as Consulting Physician (Ophthalmology) Gaynelle Arabian, MD as Consulting Physician (Orthopedic Surgery)  Indicate any recent Medical Services you may have received from other than [facility] providers in the past year (date may be approximate).     Assessment:   This is a routine wellness examination for [name].  Hearing/Vision screen Hearing Screening - Comments:: Wears Hearing aids Vision Screening - Comments:: Last exam 11/2020, Dr. Delman Cheadle   Dietary issues and exercise activities  discussed: Current Exercise Habits: Home exercise routine, Type of exercise: Other - see comments;strength training/weights;treadmill (Goes to [employer], elliptical , weights), Time (Minutes): 60 (2 hours), Frequency (Times/Week): 5 (Monday-Friday), Weekly Exercise (Minutes/Week):  300, Intensity: Intense   Goals Addressed             This Visit's Progress    Patient Stated       Would like to maintain current routine       Depression Screen PHQ 2/9 Scores 08/01/2021 07/31/2020 07/27/2019 07/23/2018 07/17/2017 07/17/2017 07/16/2016  PHQ - 2 Score 0 0 '4 4 2 2 '$ 0  PHQ- 9 Score - 0 '4 8 4 4 '$ -    Fall Risk Fall Risk  08/01/2021 07/31/2020 07/27/2019 07/23/2018 07/17/2017  Falls in the past year? 1 1 0 0 No  Number falls in past yr: 0 0 0 - -  Injury with Fall? 1 0 0 - -  Risk for fall due to : - Medication side effect No Fall Risks - -  Follow up Falls prevention discussed Falls evaluation completed;Falls prevention discussed Falls evaluation completed;Falls prevention discussed - -    FALL RISK PREVENTION PERTAINING TO THE HOME:  Any stairs in or around the home? Yes  If so, are there any without handrails? No  Home free of loose throw rugs in walkways, pet beds, electrical cords, etc? No  Adequate lighting in your home to reduce risk of falls? Yes   ASSISTIVE DEVICES UTILIZED TO PREVENT FALLS:  Life alert? No  Use of a cane, walker or w/c? No  Grab bars in the bathroom? Yes  Shower chair or bench in shower? Yes  Elevated toilet seat or a handicapped toilet? No   TIMED UP AND GO:  Was the test performed? No .   Cognitive Function: Normal cognitive status assessed by  this Nurse Health Advisor. No abnormalities found.   MMSE - Mini Mental State Exam 07/31/2020 07/27/2019 07/23/2018 07/17/2017 07/16/2016  Orientation to time '5 5 5 5 5  '$ Orientation to Place '5 5 5 5 5  '$ Registration '3 3 3 3 3  '$ Attention/ Calculation 5 5 0 0 0  Recall '3 3 3 3 3  '$ Language- name 2 objects - - 0 0 0  Language- repeat '1  1 1 1 1  '$ Language- follow 3 step command - - '3 3 3  '$ Language- read & follow direction - - 0 0 0  Write a sentence - - 0 0 0  Copy design - - 0 0 0  Total score - - '20 20 20        '$ Immunizations Immunization History  Administered Date(s) Administered   Fluad Quad(high Dose 65+) 03/08/2020   Influenza Whole 02/20/2010, 02/26/2011   Influenza, High Dose Seasonal PF 02/19/2017, 02/23/2018, 02/10/2019, 02/21/2021   Influenza,inj,Quad PF,6+ Mos 02/23/2013, 03/22/2014, 03/12/2015, 03/11/2016   Influenza,inj,quad, With Preservative 02/23/2018   Pneumococcal Conjugate-13 06/21/2014   Pneumococcal Polysaccharide-23 06/12/2010   Td 07/24/2009   Tdap 05/26/2009    TDAP status: Due, Education has been provided regarding the importance of this vaccine. Advised may receive this vaccine at local pharmacy or Health Dept. Aware to provide a copy of the vaccination record if obtained from local pharmacy or Health Dept. Verbalized acceptance and understanding.  Flu Vaccine status: Up to date  Pneumococcal vaccine status: Up to date  Covid-19 vaccine status: Information provided on how to obtain vaccines.   Qualifies for Shingles Vaccine? Yes   Zostavax completed No   Shingrix Completed?: No.    Education has been provided regarding the importance of this vaccine. Patient has been advised to call insurance company to determine out of pocket expense if they have not yet  received this vaccine. Advised may also receive vaccine at local pharmacy or Health Dept. Verbalized acceptance and understanding.  Screening Tests Health Maintenance  Topic Date Due   COVID-19 Vaccine (1) Never done   Zoster Vaccines- Shingrix (1 of 2) Never done   URINE MICROALBUMIN  07/25/2020   TETANUS/TDAP  07/26/2024 (Originally 07/25/2019)   Pneumonia Vaccine 69+ Years old  Completed   INFLUENZA VACCINE  Completed   Hepatitis C Screening  Completed   HPV VACCINES  Aged Out   Fecal DNA (Cologuard)  Discontinued     Health Maintenance  Health Maintenance Due  Topic Date Due   COVID-19 Vaccine (1) Never done   Zoster Vaccines- Shingrix (1 of 2) Never done   URINE MICROALBUMIN  07/25/2020    Colorectal cancer screening: No longer required.   Lung Cancer Screening: (Low Dose CT Chest recommended if Age 66-80 years, 30 pack-year currently smoking OR have quit w/in 15years.) does not qualify.    Additional Screening:  Hepatitis C Screening: does qualify; Completed 06/26/15  Vision Screening: Recommended annual ophthalmology exams for early detection of glaucoma and other disorders of the eye. Is the patient up to date with their annual eye exam?  Yes  Who is the provider or what is the name of the office in which the patient attends annual eye exams? Dr. Delman Cheadle    Dental Screening: Recommended annual dental exams for proper oral hygiene  Community Resource Referral / Chronic Care Management: CRR required this visit?  No   CCM required this visit?  No      Plan:     I have personally reviewed and noted the following in the patients chart:   Medical and social history Use of alcohol, tobacco or illicit drugs  Current medications and supplements including opioid prescriptions. Patient is not currently taking opioid prescriptions. Functional ability and status Nutritional status Physical activity Advanced directives List of other physicians Hospitalizations, surgeries, and ER visits in previous 12 months Vitals Screenings to include cognitive, depression, and falls Referrals and appointments  In addition, I have reviewed and discussed with patient certain preventive protocols, quality metrics, and best practice recommendations. A written personalized care plan for preventive services as well as general preventive health recommendations were provided to patient.   Due to this being a telephonic visit, the after visit summary with patients personalized plan was offered to patient  via mail or my-chart.  Patient would like to access on my-chart.     Loma Messing, LPN   10/02/9355   Nurse Health Advisor  Nurse Notes: none

## 2021-08-01 ENCOUNTER — Ambulatory Visit (INDEPENDENT_AMBULATORY_CARE_PROVIDER_SITE_OTHER): Payer: Medicare Other

## 2021-08-01 ENCOUNTER — Other Ambulatory Visit: Payer: Self-pay

## 2021-08-01 ENCOUNTER — Other Ambulatory Visit: Payer: Self-pay | Admitting: Family Medicine

## 2021-08-01 VITALS — Ht 66.0 in | Wt 216.0 lb

## 2021-08-01 DIAGNOSIS — Z0000 Encounter for general adult medical examination without abnormal findings: Secondary | ICD-10-CM

## 2021-08-01 DIAGNOSIS — E7800 Pure hypercholesterolemia, unspecified: Secondary | ICD-10-CM

## 2021-08-01 DIAGNOSIS — M1A0790 Idiopathic chronic gout, unspecified ankle and foot, without tophus (tophi): Secondary | ICD-10-CM

## 2021-08-01 DIAGNOSIS — R7301 Impaired fasting glucose: Secondary | ICD-10-CM

## 2021-08-01 NOTE — Patient Instructions (Addendum)
Mr. [name] , Thank you for taking time to complete your Medicare Wellness Visit. I appreciate your ongoing commitment to your health goals. Please review the following plan we discussed and let me know if I can assist you in the future.   Screening recommendations/referrals: Colonoscopy: no longer required, per our conversation [facility] Ophthalmology/optometry visit:  up to date  Recommended yearly dental visit for hygiene and checkup  Vaccinations: Influenza vaccine: up to date  Pneumococcal vaccine: up to date Tdap vaccine: due 07/25/19, per our conversation you plane [facility] discuss vaccine with the [facility] Shingles vaccine: Discuss with your local pharmacy   Covid-19: newest booster available at your local pharmacy  Advanced directives: Please bring a copy of Living Will and/or [place] if you would like a copy in your chart.  Conditions/risks identified: see problem list   Next appointment: Follow up in one year for your annual wellness visit.   Preventive Care 77 Years and Older, Male Preventive care refers to lifestyle choices and visits with your health care provider that can promote health and wellness. What does preventive care include? A yearly physical exam. This is also called an annual well check. Dental exams once or twice a year. Routine eye exams. Ask your health care provider how often you should have your eyes checked. Personal lifestyle choices, including: Daily care of your teeth and gums. Regular physical activity. Eating a healthy diet. Avoiding tobacco and drug use. Limiting alcohol use. Practicing safe sex. Taking low doses of aspirin every day. Taking vitamin and mineral supplements as recommended by your health care provider. What happens during an annual well check? The services and screenings done by your health care provider during your annual well check will depend on your age, overall health, lifestyle  risk factors, and family history of disease. Counseling  Your health care provider may ask you questions about your: Alcohol use. Tobacco use. Drug use. Emotional well-being. Home and relationship well-being. Sexual activity. Eating habits. History of falls. Memory and ability to understand (cognition). Work and work Statistician. Screening  You may have the following tests or measurements: Height, weight, and BMI. Blood pressure. Lipid and cholesterol levels. These may be checked every 5 years, or more frequently if you are over 16 years old. Skin check. Lung cancer screening. You may have this screening every year starting at age 77 if you have a 30-pack-year history of smoking and currently smoke or have quit within the past 15 years. Fecal occult blood test (FOBT) of the stool. You may have this test every year starting at age 77. Flexible sigmoidoscopy or colonoscopy. You may have a sigmoidoscopy every 5 years or a colonoscopy every 10 years starting at age 77. Prostate cancer screening. Recommendations will vary depending on your family history and other risks. Hepatitis C blood test. Hepatitis B blood test. Sexually transmitted disease (STD) testing. Diabetes screening. This is done by checking your blood sugar (glucose) after you have not eaten for a while (fasting). You may have this done every 1-3 years. Abdominal aortic aneurysm (AAA) screening. You may need this if you are a current or former smoker. Osteoporosis. You may be screened starting at age 77 if you are at high risk. Talk with your health care provider about your test results, treatment options, and if necessary, the need for more tests. Vaccines  Your health care provider may recommend certain vaccines, such as: Influenza vaccine. This is recommended every year. Tetanus,  diphtheria, and acellular pertussis (Tdap, Td) vaccine. You may need a Td booster every 10 years. Zoster vaccine. You may need this after age  72. Pneumococcal 13-valent conjugate (PCV13) vaccine. One dose is recommended after age 79. Pneumococcal polysaccharide (PPSV23) vaccine. One dose is recommended after age 77. Talk to your health care provider about which screenings and vaccines you need and how often you need them. This information is not intended to replace advice given to you by your health care provider. Make sure you discuss any questions you have with your health care provider. Document Released: 06/08/2015 Document Revised: 01/30/2016 Document Reviewed: 03/13/2015 Elsevier Interactive Patient Education  2017 [place] Prevention in the Home Falls can cause injuries. They can happen to people of all ages. There are many things you can do to make your home safe and to help prevent falls. What can I do on the outside of my home? Regularly fix the edges of walkways and driveways and fix any cracks. Remove anything that might make you trip as you walk through a door, such as a raised step or threshold. Trim any bushes or trees on the path to your home. Use bright outdoor lighting. Clear any walking paths of anything that might make someone trip, such as rocks or tools. Regularly check to see if handrails are loose or broken. Make sure that both sides of any steps have handrails. Any raised decks and porches should have guardrails on the edges. Have any leaves, snow, or ice cleared regularly. Use sand or salt on walking paths during winter. Clean up any spills in your garage right away. This includes oil or grease spills. What can I do in the bathroom? Use night lights. Install grab bars by the toilet and in the tub and shower. Do not use towel bars as grab bars. Use non-skid mats or decals in the tub or shower. If you need to sit down in the shower, use a plastic, non-slip stool. Keep the floor dry. Clean up any water that spills on the floor as soon as it happens. Remove soap buildup in the tub or shower  regularly. Attach bath mats securely with double-sided non-slip rug tape. Do not have throw rugs and other things on the floor that can make you trip. What can I do in the bedroom? Use night lights. Make sure that you have a light by your bed that is easy to reach. Do not use any sheets or blankets that are too big for your bed. They should not hang down onto the floor. Have a firm chair that has side arms. You can use this for support while you get dressed. Do not have throw rugs and other things on the floor that can make you trip. What can I do in the kitchen? Clean up any spills right away. Avoid walking on wet floors. Keep items that you use a lot in easy-to-reach places. If you need to reach something above you, use a strong step stool that has a grab bar. Keep electrical cords out of the way. Do not use floor polish or wax that makes floors slippery. If you must use wax, use non-skid floor wax. Do not have throw rugs and other things on the floor that can make you trip. What can I do with my stairs? Do not leave any items on the stairs. Make sure that there are handrails on both sides of the stairs and use them. Fix handrails that are broken or loose. Make  sure that handrails are as long as the stairways. Check any carpeting to make sure that it is firmly attached to the stairs. Fix any carpet that is loose or worn. Avoid having throw rugs at the top or bottom of the stairs. If you do have throw rugs, attach them to the floor with carpet tape. Make sure that you have a light switch at the top of the stairs and the bottom of the stairs. If you do not have them, ask someone to add them for you. What else can I do to help prevent falls? Wear shoes that: Do not have high heels. Have rubber bottoms. Are comfortable and fit you well. Are closed at the toe. Do not wear sandals. If you use a stepladder: Make sure that it is fully opened. Do not climb a closed stepladder. Make sure that  both sides of the stepladder are locked into place. Ask someone to hold it for you, if possible. Clearly mark and make sure that you can see: Any grab bars or handrails. First and last steps. Where the edge of each step is. Use tools that help you move around (mobility aids) if they are needed. These include: Canes. Walkers. Scooters. Crutches. Turn on the lights when you go into a dark area. Replace any light bulbs as soon as they burn out. Set up your furniture so you have a clear path. Avoid moving your furniture around. If any of your floors are uneven, fix them. If there are any pets around you, be aware of where they are. Review your medicines with your doctor. Some medicines can make you feel dizzy. This can increase your chance of falling. Ask your doctor what other things that you can do to help prevent falls. This information is not intended to replace advice given to you by your health care provider. Make sure you discuss any questions you have with your health care provider. Document Released: 03/08/2009 Document Revised: 10/18/2015 Document Reviewed: 06/16/2014 Elsevier Interactive Patient Education  2017 [employer].

## 2021-08-02 ENCOUNTER — Other Ambulatory Visit (INDEPENDENT_AMBULATORY_CARE_PROVIDER_SITE_OTHER): Payer: Medicare Other

## 2021-08-02 DIAGNOSIS — M1A0790 Idiopathic chronic gout, unspecified ankle and foot, without tophus (tophi): Secondary | ICD-10-CM

## 2021-08-02 DIAGNOSIS — E7800 Pure hypercholesterolemia, unspecified: Secondary | ICD-10-CM | POA: Diagnosis not present

## 2021-08-02 DIAGNOSIS — R7301 Impaired fasting glucose: Secondary | ICD-10-CM

## 2021-08-02 LAB — COMPREHENSIVE METABOLIC PANEL
ALT: 20 U/L (ref 0–53)
AST: 21 U/L (ref 0–37)
Albumin: 4.5 g/dL (ref 3.5–5.2)
Alkaline Phosphatase: 72 U/L (ref 39–117)
BUN: 18 mg/dL (ref 6–23)
CO2: 26 mEq/L (ref 19–32)
Calcium: 9.2 mg/dL (ref 8.4–10.5)
Chloride: 105 mEq/L (ref 96–112)
Creatinine, Ser: 0.98 mg/dL (ref 0.40–1.50)
GFR: 74.72 mL/min (ref >60.00)
Glucose, Bld: 102 mg/dL — ABNORMAL HIGH (ref 70–99)
Potassium: 4 mEq/L (ref 3.5–5.1)
Sodium: 140 mEq/L (ref 135–145)
Total Bilirubin: 0.7 mg/dL (ref 0.2–1.2)
Total Protein: 7.1 g/dL (ref 6.0–8.3)

## 2021-08-02 LAB — LIPID PANEL
Cholesterol: 192 mg/dL (ref 0–200)
HDL: 46.9 mg/dL (ref >39.00)
LDL Cholesterol: 124 mg/dL — ABNORMAL HIGH (ref 0–99)
NonHDL: 144.92
Total CHOL/HDL Ratio: 4
Triglycerides: 106 mg/dL (ref 0.0–149.0)
VLDL: 21.2 mg/dL (ref 0.0–40.0)

## 2021-08-02 LAB — MICROALBUMIN / CREATININE URINE RATIO
Creatinine,U: 155.3 mg/dL
Microalb Creat Ratio: 0.7 mg/g (ref 0.0–30.0)
Microalb, Ur: 1.1 mg/dL (ref 0.0–1.9)

## 2021-08-02 LAB — HEMOGLOBIN A1C: Hgb A1c MFr Bld: 5.7 % (ref 4.6–6.5)

## 2021-08-02 LAB — URIC ACID: Uric Acid, Serum: 7.3 mg/dL (ref 4.0–7.8)

## 2021-08-09 ENCOUNTER — Telehealth: Payer: Self-pay | Admitting: Physical Medicine and Rehabilitation

## 2021-08-09 ENCOUNTER — Ambulatory Visit (INDEPENDENT_AMBULATORY_CARE_PROVIDER_SITE_OTHER): Payer: Medicare Other | Admitting: Family Medicine

## 2021-08-09 ENCOUNTER — Other Ambulatory Visit: Payer: Self-pay

## 2021-08-09 ENCOUNTER — Encounter: Payer: Self-pay | Admitting: Family Medicine

## 2021-08-09 VITALS — BP 134/84 | HR 75 | Temp 97.5°F | Ht 66.5 in | Wt 218.2 lb

## 2021-08-09 DIAGNOSIS — R7301 Impaired fasting glucose: Secondary | ICD-10-CM | POA: Diagnosis not present

## 2021-08-09 DIAGNOSIS — E669 Obesity, unspecified: Secondary | ICD-10-CM

## 2021-08-09 DIAGNOSIS — K219 Gastro-esophageal reflux disease without esophagitis: Secondary | ICD-10-CM

## 2021-08-09 DIAGNOSIS — F4310 Post-traumatic stress disorder, unspecified: Secondary | ICD-10-CM | POA: Diagnosis not present

## 2021-08-09 DIAGNOSIS — E7800 Pure hypercholesterolemia, unspecified: Secondary | ICD-10-CM

## 2021-08-09 DIAGNOSIS — M4722 Other spondylosis with radiculopathy, cervical region: Secondary | ICD-10-CM | POA: Diagnosis not present

## 2021-08-09 DIAGNOSIS — M1A0790 Idiopathic chronic gout, unspecified ankle and foot, without tophus (tophi): Secondary | ICD-10-CM | POA: Diagnosis not present

## 2021-08-09 MED ORDER — ALLOPURINOL 100 MG PO TABS: 100.0000 mg | ORAL_TABLET | Freq: Every day | ORAL | 3 refills | Status: DC

## 2021-08-09 MED ORDER — LOVASTATIN 20 MG PO TABS: 20.0000 mg | ORAL_TABLET | Freq: Every day | ORAL | 3 refills | Status: DC

## 2021-08-09 MED ORDER — PANTOPRAZOLE SODIUM 40 MG PO TBEC: 40.0000 mg | DELAYED_RELEASE_TABLET | Freq: Every day | ORAL | 3 refills | Status: DC

## 2021-08-09 NOTE — Assessment & Plan Note (Signed)
Continue sertraline managed through the [place].  ?

## 2021-08-09 NOTE — Assessment & Plan Note (Signed)
A1c creeping into diabetes range - encouraged limiting added sugars in diet.  ?

## 2021-08-09 NOTE — Assessment & Plan Note (Signed)
Ongoing symptoms despite R C7/T1 epidural injection.  ?I asked him to reach out to Dr [facility] office about follow up plan due to ongoing pain. ?I will also refer him to outpatient PT at [facility] physical therapy. Update with effect. ?

## 2021-08-09 NOTE — Telephone Encounter (Signed)
Pt called to let [place] know that the injection in december did not work. Wondering what is the next step?  ? ?[phone]  ?

## 2021-08-09 NOTE — Patient Instructions (Addendum)
Touch base with Dr Ernestina Patches about ongoing neck pain.  ?We will refer you to [facility] physical therapy.  ?If interested, check with pharmacy about new 2 shot shingles series (shingrix).  ?You are doing well today ?Return as needed or in 1 year for next wellness visit.  ? ?Health Maintenance After Age 77 ?After age 22, you are at a higher risk for certain long-term diseases and infections as well as injuries from falls. Falls are a major cause of broken bones and head injuries in people who are older than age 19. Getting regular preventive care can help to keep you healthy and well. Preventive care includes getting regular testing and making lifestyle changes as recommended by your health care provider. Talk with your health care provider about: ?Which screenings and tests you should have. A screening is a test that checks for a disease when you have no symptoms. ?A diet and exercise plan that is right for you. ?What should I know about screenings and tests to prevent falls? ?Screening and testing are the best ways to find a health problem early. Early diagnosis and treatment give you the best chance of managing medical conditions that are common after age 50. Certain conditions and lifestyle choices may make you more likely to have a fall. Your health care provider may recommend: ?Regular vision checks. Poor vision and conditions such as cataracts can make you more likely to have a fall. If you wear glasses, make sure to get your prescription updated if your vision changes. ?Medicine review. Work with your health care provider to regularly review all of the medicines you are taking, including over-the-counter medicines. Ask your health care provider about any side effects that may make you more likely to have a fall. Tell your health care provider if any medicines that you take make you feel dizzy or sleepy. ?Strength and balance checks. Your health care provider may recommend certain tests to check your strength and  balance while standing, walking, or changing positions. ?Foot health exam. Foot pain and numbness, as well as not wearing proper footwear, can make you more likely to have a fall. ?Screenings, including: ?Osteoporosis screening. Osteoporosis is a condition that causes the bones to get weaker and break more easily. ?Blood pressure screening. Blood pressure changes and medicines to control blood pressure can make you feel dizzy. ?Depression screening. You may be more likely to have a fall if you have a fear of falling, feel depressed, or feel unable to do activities that you used to do. ?Alcohol use screening. Using too much alcohol can affect your balance and may make you more likely to have a fall. ?Follow these instructions at home: ?Lifestyle ?Do not drink alcohol if: ?Your health care provider tells you not to drink. ?If you drink alcohol: ?Limit how much you have to: ?0-1 drink a day for women. ?0-2 drinks a day for men. ?Know how much alcohol is in your drink. In the [place] one drink equals one 12 oz bottle of beer (355 mL), one 5 oz glass of wine (148 mL), or one 1? oz glass of hard liquor (44 mL). ?Do not use any products that contain nicotine or tobacco. These products include cigarettes, chewing tobacco, and vaping devices, such as e-cigarettes. If you need help quitting, ask your health care provider. ?Activity ? ?Follow a regular exercise program to stay fit. This will help you maintain your balance. Ask your health care provider what types of exercise are appropriate for you. ?If you need  a cane or walker, use it as recommended by your health care provider. ?Wear supportive shoes that have nonskid soles. ?Safety ? ?Remove any tripping hazards, such as rugs, cords, and clutter. ?Install safety equipment such as grab bars in bathrooms and safety rails on stairs. ?Keep rooms and walkways well-lit. ?General instructions ?Talk with your health care provider about your risks for falling. Tell your health care  provider if: ?You fall. Be sure to tell your health care provider about all falls, even ones that seem minor. ?You feel dizzy, tiredness (fatigue), or off-balance. ?Take over-the-counter and prescription medicines only as told by your health care provider. These include supplements. ?Eat a healthy diet and maintain a healthy weight. A healthy diet includes low-fat dairy products, low-fat (lean) meats, and fiber from whole grains, beans, and lots of fruits and vegetables. ?Stay current with your vaccines. ?Schedule regular health, dental, and eye exams. ?Summary ?Having a healthy lifestyle and getting preventive care can help to protect your health and wellness after age 77. ?Screening and testing are the best way to find a health problem early and help you avoid having a fall. Early diagnosis and treatment give you the best chance for managing medical conditions that are more common for people who are older than age 59. ?Falls are a major cause of broken bones and head injuries in people who are older than age 35. Take precautions to prevent a fall at home. ?Work with your health care provider to learn what changes you can make to improve your health and wellness and to prevent falls. ?This information is not intended to replace advice given to you by your health care provider. Make sure you discuss any questions you have with your health care provider. ?Document Revised: 10/01/2020 Document Reviewed: 10/01/2020 ?Elsevier Patient Education ? [place]. ? ?

## 2021-08-09 NOTE — Assessment & Plan Note (Signed)
Chronic, no recent gout flare. Uric acid above goal. He's not been taking allopurinol daily - will start daily and monitor levels. ?

## 2021-08-09 NOTE — Assessment & Plan Note (Signed)
Continues daily pantoprazole.  

## 2021-08-09 NOTE — Assessment & Plan Note (Signed)
Chronic, tolerating lovastatin - continue. Intolerant to simvastatin and pravastatin in the past ?The 10-year ASCVD risk score (Arnett DK, et al., 2019) is: 49.6% ?  Values used to calculate the score: ?    Age: 75 years ?    Sex: Male ?    Is Non-Hispanic African American: No ?    Diabetic: Yes ?    Tobacco smoker: Yes ?    Systolic Blood Pressure: 828 mmHg ?    Is BP treated: No ?    HDL Cholesterol: 46.9 mg/dL ?    Total Cholesterol: 192 mg/dL  ?

## 2021-08-09 NOTE — Progress Notes (Addendum)
? ? Patient ID: [name], male    DOB: 11/02/1945, 76 y.o.   MRN: [id number] ? ?This visit was conducted in person. ? ?BP 134/84   Pulse 75   Temp (!) 97.5 ?F (36.4 ?C) (Temporal)   Ht 5' 6.5" (1.689 m)   Wt 218 lb 4 oz (99 kg)   SpO2 95%   BMI 34.70 kg/m?   ? ?CC: AMW f/u visit  ?Subjective:  ? ?HPI: ?[name] is a 76 y.o. male presenting on 08/09/2021 for Annual Exam [facility] prt 2. ) ? ? ? ?Saw health advisor last week for medicare wellness visit. Note reviewed.   ? ?No results found.  ?Flowsheet Row Clinical Support from 08/01/2021 in [place] at [place]  ?PHQ-2 Total Score 0  ? ?  ?  ?Fall Risk  08/01/2021 07/31/2020 07/27/2019 07/23/2018 07/17/2017  ?Falls in the past year? 1 1 0 0 No  ?Number falls in past yr: 0 0 0 - -  ?Injury with Fall? 1 0 0 - -  ?Risk for fall due to : - Medication side effect No Fall Risks - -  ?Follow up Falls prevention discussed Falls evaluation completed;Falls prevention discussed Falls evaluation completed;Falls prevention discussed - -  ?3 months ago fell while doing [facility] at senior center. Dislocated L finger - saw [facility] s/p relocation.  ? ?Chronic severe R cervical radiculopathy s/p epidural steroid injection 04/2021 ([place] care Dr Ernestina Patches) without significant benefit. Notes ongoing significant R neck pain with radiation to shoulder. Denies numbness or weakness of the arms. [facility] - finds activity does seem to help. Continues using aleve '660mg'$  once daily and tylenol arthritis '1300mg'$  once daily.  ? ?Preventative: ?Colonoscopy 2008 - normal, rpt 10 yrs Amedeo Plenty, [place]). Cologuard negative 07/2016, 07/2019.  ?Prostate - normal yearly screen. PSA reassuring. nocturia x2 - has increased coffee at bedtime. Got K cup for Christmas. Notes increasing urgency. No weakening of stream. Discussed - will stop screening  ?Lung cancer screen - not eligible  ?Flu shot yearly  ?COVID vaccine - declines  ?Pneumovax 05/2010,  [id number] 2016  ?Td 07/2009 ?Shingrix - discussed, to check with pharmacy  ?Advanced directives - wife Jessamine then son Cailen Mihalik) would be HCPOA. Brought form, reviewed and scanned 06/2016.  ?Seat belt use discussed.  ?Sunscreen use discussed. Seeing derm for recent skin cancer treatment.  ?Non smoker  ?Alcohol - rare  ?Dentist q3 mo ?Eye exam yearly  ?Bowel - no diarrhea, constipation  ?Bladder - no incontinence  ? ?Caffeine: occaisonally, 2 diet cokes/day ?Married, lives with wife ?[place] veteran ?Retired: Development worker, community ?Activity: Walks and runs 3 miles a day at [facility] 6d/wk. works out 3hr/day. Golfing.  ?Diet: good water, fruits/vegetables daily, avoiding carbs and watching portion sizes ?   ? ?Relevant past medical, surgical, family and social history reviewed and updated as indicated. Interim medical history since our last visit reviewed. ?Allergies and medications reviewed and updated. ?Outpatient Medications Prior to Visit  ?Medication Sig Dispense Refill  ? acetaminophen (TYLENOL) 500 MG tablet Take 1,000 mg by mouth daily as needed for headache.     ? albuterol (VENTOLIN HFA) 108 (90 Base) MCG/ACT inhaler INHALE TWO PUFFS INTO THE LUNGS EVERY 6 HOURS AS NEEDED 18 each 3  ? Cholecalciferol (VITAMIN D3) 125 MCG (5000 UT) CAPS Take by mouth.    ? co-enzyme Q-10 50 MG capsule Take 1 capsule (50 mg total) by mouth daily.    ?  colchicine 0.6 MG tablet Take 1 tablet (0.6 mg total) by mouth daily as needed (gout flare). First dose may take 2 tablets (1.'2mg'$ ) 30 tablet 3  ? fluticasone (FLONASE) 50 MCG/ACT nasal spray USE 2 SPRAYS INTO EACH NOSTRIL ONCE DAILY AS NEEDED FOR CONGESTION 16 mL 9  ? indomethacin (INDOCIN) 25 MG capsule Take 1 capsule (25 mg total) by mouth 2 (two) times daily as needed (gout flare). 60 capsule 0  ? Misc Natural Products (OSTEO BI-FLEX ADV TRIPLE ST PO) Take by mouth.    ? Naproxen Sodium (ALEVE PO) Take 2 tablets by mouth as needed.    ? QUERCETIN PO Take by mouth.    ? sertraline  (ZOLOFT) 100 MG tablet TAKE ONE-HALF TABLET BY MOUTH TWICE A DAY FOR PTSD    ? sildenafil (REVATIO) 20 MG tablet TAKE 2-5 TABLETS DAILY AS NEEDED 45 tablet 1  ? vitamin C (ASCORBIC ACID) 500 MG tablet Take 500 mg by mouth daily.    ? Zinc 50 MG TABS Take by mouth.    ? allopurinol (ZYLOPRIM) 100 MG tablet Take 1 tablet (100 mg total) by mouth daily. 90 tablet 3  ? cyclobenzaprine (FLEXERIL) 10 MG tablet Take 0.5-1 tablets (5-10 mg total) by mouth 2 (two) times daily as needed for muscle spasms (sedation). 20 tablet 0  ? lovastatin (MEVACOR) 20 MG tablet Take 1 tablet (20 mg total) by mouth daily at 6 PM. 90 tablet 3  ? pantoprazole (PROTONIX) 40 MG tablet Take 1 tablet (40 mg total) by mouth daily. 90 tablet 3  ? chlorhexidine (PERIDEX) 0.12 % solution chlorhexidine gluconate 0.12 % mouthwash (Patient not taking: Reported on 08/01/2021)    ? diazepam (VALIUM) 5 MG tablet Take one tablet by mouth with food one hour prior to procedure. May repeat 30 minutes prior if needed. (Patient not taking: Reported on 08/01/2021) 2 tablet 0  ? methylPREDNISolone (MEDROL DOSEPAK) 4 MG TBPK tablet Use as directed (Patient not taking: Reported on 08/01/2021) 21 tablet 0  ? Multiple Vitamins-Minerals (MULTIVITAMIN ADULT PO) multivitamin    ? sertraline (ZOLOFT) 100 MG tablet Take 100 mg by mouth daily.    ? ?No facility-administered medications prior to visit.  ?  ? ?Per HPI unless specifically indicated in ROS section below ?Review of Systems ? ?Objective:  ?BP 134/84   Pulse 75   Temp (!) 97.5 ?F (36.4 ?C) (Temporal)   Ht 5' 6.5" (1.689 m)   Wt 218 lb 4 oz (99 kg)   SpO2 95%   BMI 34.70 kg/m?   ?Wt Readings from Last 3 Encounters:  ?08/09/21 218 lb 4 oz (99 kg)  ?08/01/21 216 lb (98 kg)  ?03/05/21 216 lb 7 oz (98.2 kg)  ?  ?  ?Physical Exam ?Vitals and nursing note reviewed.  ?Constitutional:   ?   General: He is not in acute distress. ?   Appearance: Normal appearance. He is well-developed. He is not ill-appearing.  ?HENT:  ?    Head: Normocephalic and atraumatic.  ?   Right Ear: Hearing, tympanic membrane, ear canal and external ear normal.  ?   Left Ear: Hearing, tympanic membrane, ear canal and external ear normal.  ?Eyes:  ?   General: No scleral icterus. ?   Extraocular Movements: Extraocular movements intact.  ?   Conjunctiva/sclera: Conjunctivae normal.  ?   Pupils: Pupils are equal, round, and reactive to light.  ?Neck:  ?   Thyroid: No thyroid mass or thyromegaly.  ?  Vascular: No carotid bruit.  ?Cardiovascular:  ?   Rate and Rhythm: Normal rate and regular rhythm.  ?   Pulses: Normal pulses.     ?     Radial pulses are 2+ on the right side and 2+ on the left side.  ?   Heart sounds: Normal heart sounds. No murmur heard. ?Pulmonary:  ?   Effort: Pulmonary effort is normal. No respiratory distress.  ?   Breath sounds: Normal breath sounds. No wheezing, rhonchi or rales.  ?Abdominal:  ?   General: Bowel sounds are normal. There is no distension.  ?   Palpations: Abdomen is soft. There is no mass.  ?   Tenderness: There is no abdominal tenderness. There is no guarding or rebound.  ?   Hernia: No hernia is present.  ?Musculoskeletal:     ?   General: Normal range of motion.  ?   Cervical back: Normal range of motion and neck supple.  ?   Right lower leg: No edema.  ?   Left lower leg: No edema.  ?   Comments: Limited ROM diffusely cervical neck due to pain  ?Lymphadenopathy:  ?   Cervical: No cervical adenopathy.  ?Skin: ?   General: Skin is warm and dry.  ?   Findings: No rash.  ?Neurological:  ?   General: No focal deficit present.  ?   Mental Status: He is alert and oriented to person, place, and time.  ?   Comments:  ?Grip strength intact ?5/5 strength BUE  ?Psychiatric:     ?   Mood and Affect: Mood normal.     ?   Behavior: Behavior normal.     ?   Thought Content: Thought content normal.     ?   Judgment: Judgment normal.  ? ?   ?Results for orders placed or performed in visit on 08/02/21  ?Microalbumin / creatinine urine  ratio  ?Result Value Ref Range  ? Microalb, Ur 1.1 0.0 - 1.9 mg/dL  ? Creatinine,U 155.3 mg/dL  ? Microalb Creat Ratio 0.7 0.0 - 30.0 mg/g  ?Hemoglobin A1c  ?Result Value Ref Range  ? Hgb A1c MFr Bld 5.7 4.6 - 6.5 %  ?Samuel Germany

## 2021-08-09 NOTE — Assessment & Plan Note (Signed)
Encouraged continued regular exercise routine for goal sustainable weight loss.  ?

## 2021-08-13 ENCOUNTER — Telehealth: Payer: Self-pay | Admitting: Physical Medicine and Rehabilitation

## 2021-08-13 NOTE — Telephone Encounter (Signed)
He states that the injection did not do anything for him. Is there something else that he can try or what is the next step for him? ?

## 2021-08-13 NOTE — Telephone Encounter (Signed)
Pt called requesting a call back from Dr. Ernestina Patches. Pt state Dr. Ernestina Patches informed pt and wife to call on update after injection. Please call pt at [phone]. ?

## 2021-08-15 ENCOUNTER — Ambulatory Visit (INDEPENDENT_AMBULATORY_CARE_PROVIDER_SITE_OTHER): Payer: Medicare Other | Admitting: Physical Medicine and Rehabilitation

## 2021-08-15 ENCOUNTER — Other Ambulatory Visit: Payer: Self-pay

## 2021-08-15 ENCOUNTER — Encounter: Payer: Self-pay | Admitting: Physical Medicine and Rehabilitation

## 2021-08-15 VITALS — BP 161/97 | HR 67

## 2021-08-15 DIAGNOSIS — M47812 Spondylosis without myelopathy or radiculopathy, cervical region: Secondary | ICD-10-CM | POA: Diagnosis not present

## 2021-08-15 DIAGNOSIS — M7918 Myalgia, other site: Secondary | ICD-10-CM

## 2021-08-15 DIAGNOSIS — M542 Cervicalgia: Secondary | ICD-10-CM

## 2021-08-15 NOTE — Progress Notes (Signed)
Pt has hx of inj on 05/07/21 pt state it didn't help. Pt state neck pain that travels to his right shoulder. Pt state turning his head and laying down makes the pain worse. Pt state he takes over the counter pain meds to help ease his pain. ? ?Numeric Pain Rating Scale and Functional Assessment ?Average Pain 10 ?Pain Right Now 2 ?My pain is intermittent, sharp, dull, stabbing, and aching ?Pain is worse with: bending, some activites, and turning his head ?Pain improves with: rest and medication ? ? ?In the last MONTH (on 0-10 scale) has pain interfered with the following? ? ?1. General activity like being  able to carry out your everyday physical activities such as walking, climbing stairs, carrying groceries, or moving a chair?  ?Rating(6) ? ?2. Relation with others like being able to carry out your usual social activities and roles such as  activities at home, at work and in your community. ?Rating(7) ? ?3. Enjoyment of life such that you have  been bothered by emotional problems such as feeling anxious, depressed or irritable?  ?Rating(8) ? ?

## 2021-08-15 NOTE — Progress Notes (Signed)
? ?[name] - 76 y.o. male MRN [id number]  Date of birth: 1945-10-03 ? ?Office Visit Note: ?Visit Date: 08/15/2021 ?PCP: Ria Bush, MD ?Referred by: Ria Bush, MD ? ?Subjective: ?Chief Complaint  ?Patient presents with  ? Neck - Pain  ? Right Shoulder - Pain  ? ?HPI: [name] is a 76 y.o. male who comes in today for evaluation of chronic, worsening and severe right sided neck pain radiating to right shoulder.  Patient reports pain has been ongoing for several months and is exacerbated by movement and activity, his pain increases significantly when he rotates his neck to the right side.  Patient describes pain as a constant dull throbbing sensation, currently rates as 8 out of 10.  He reports some relief of pain with home exercise regimen, rest and use of over-the-counter medications as needed. Patient reports he attends the [facility] daily and participates in aerobics and weight lifting exercises. Patient's primary care provider Dr. Ria Bush recently placed a referral for physical therapy at [facility] in [place], however patient states he would like to arrange for physical therapy with our in-house team. Patients cervical MRI from 2022 exhibits multi-level facet hypertrophy, mild to moderate spinal canal stenosis noted at C6-C7 and mild spinal canal stenosis noted at C7-T1. Patient had right C7-T1 interlaminar epidural steroid injection in December of 2022 and reports no relief with this procedure. Patient states his pain is interfering with his daily life and also makes driving very difficult.  Patient denies focal weakness, numbness and tingling.  Patient denies recent trauma or falls. ? ?Review of Systems  ?Musculoskeletal:  Positive for myalgias and neck pain.  ?Neurological:  Negative for tingling, sensory change, focal weakness and weakness.  ?All other systems reviewed and are negative. Otherwise per HPI. ? ?Assessment & Plan: ?Visit Diagnoses:  ?  ICD-10-CM   ?1.  Spondylosis without myelopathy or radiculopathy, cervical region  M47.812 Ambulatory referral to Physical Medicine Rehab  ?  Ambulatory referral to Physical Therapy  ?  ?2. Cervicalgia  M54.2 Ambulatory referral to Physical Medicine Rehab  ?  Ambulatory referral to Physical Therapy  ?  ?3. Facet hypertrophy of cervical region  M47.812 Ambulatory referral to Physical Medicine Rehab  ?  Ambulatory referral to Physical Therapy  ?  ?4. Myofascial pain syndrome  M79.18   ?  ?   ?Plan: Findings:  ?Chronic, worsening and severe right-sided neck pain radiating to right shoulder.  Patient continues to have excruciating and debilitating pain despite good conservative therapies such as home exercise regimen, rest and use of medications.  Patient's clinical presentation and exam are consistent with cervical facet mediated pain he does have severe pain with side-to-side rotation of his neck.  We also feel there could be a myofascial component contributing to his pain.  We believe the next step is to perform diagnostic and hopefully therapeutic right C5-C6 and C6-C7 facet joint/medial branch blocks under fluoroscopic guidance.  If patient gets good relief of pain with medial branch blocks we did discuss the possibility of longer sustained pain relief with radiofrequency ablation procedure. I did place a referral today for formal physical therapy with our in-house team with a focus on manual treatments/facet joint pain.  Patient encouraged to remain active and to continue home exercise regimen as tolerated.  No red flag symptoms noted upon exam today.  ? ?Meds & Orders: No orders of the defined types were placed in this encounter. ?  ?Orders Placed This Encounter  ?Procedures  ?  Ambulatory referral to Physical Medicine Rehab  ? Ambulatory referral to Physical Therapy  ?  ?Follow-up: Return for Right C5-C6 and C6-C7 facet joint/medial branch blocks.  ? ?Procedures: ?No procedures performed  ?   ? ?Clinical History: ?EXAM: ?MRI  CERVICAL SPINE WITHOUT CONTRAST ?  ?TECHNIQUE: ?Multiplanar, multisequence MR imaging of the cervical spine was ?performed. No intravenous contrast was administered. ?  ?COMPARISON:  No prior MRI, ?  ?FINDINGS: ?Alignment: Reversal of the normal cervical lordosis with trace ?retrolisthesis of C6 on C7 and mild anterolisthesis of C7 on T1. ?  ?Vertebrae: No acute fracture or suspicious osseous lesion. ?Congenitally short pedicles, which narrow the AP diameter of the ?spinal canal. ?  ?Cord: Normal signal and morphology. ?  ?Posterior Fossa, vertebral arteries, paraspinal tissues: Large ?posterior fossa arachnoid cyst. Otherwise negative. ?  ?Disc levels: ?  ?C2-C3: No significant disc bulge. Facet arthropathy. No spinal canal ?stenosis or neuroforaminal narrowing. ?  ?C3-C4: Small central disc protrusion, which abuts the ventral cord. ?No spinal canal stenosis. Uncovertebral and facet arthropathy. ?Severe right and moderate to severe left neural foraminal narrowing. ?  ?C4-C5: Mild disc bulge. Uncovertebral and facet arthropathy. No ?spinal canal stenosis. Severe bilateral neural foraminal narrowing. ?  ?C5-C6: Mild disc bulge. Uncovertebral and facet arthropathy. No ?spinal canal stenosis. Moderate to severe left and mild right neural ?foraminal narrowing. ?  ?C6-C7: Trace retrolisthesis with broad-based disc bulge. Mild to ?moderate spinal canal stenosis. Uncovertebral and facet arthropathy. ?Moderate bilateral neural foraminal narrowing. ?  ?C7-T1: Mild anterolisthesis with disc unroofing. Mild spinal canal ?stenosis. Uncovertebral and facet arthropathy. Moderate to severe ?left and moderate right neural foraminal narrowing. ?  ?IMPRESSION: ?1. C6-C7 mild-to-moderate spinal canal stenosis with moderate ?bilateral neural foraminal narrowing. ?2. C7-T1 mild spinal canal stenosis with moderate to severe left and ?moderate right neural foraminal narrowing. ?3. Multilevel facet and uncovertebral hypertrophy, which  causes ?severe neural foraminal narrowing on the right at C3-C4 and ?bilaterally at C4-C5, with additional less severe neural foraminal ?narrowing at multiple levels, as described above. ?  ?  ?Electronically Signed ?  By: Merilyn Baba M.D. ?  On: 05/02/2021 16:31  ? ?He reports that he has never smoked. He has never used smokeless tobacco.  ?Recent Labs  ?  08/02/21 ?0921  ?HGBA1C 5.7  ?LABURIC 7.3  ? ? ?Objective:  VS:  HT:    WT:   BMI:     BP: ?(!) 161/97  HR:67bpm  TEMP: ( )  RESP:  ?Physical Exam ?Vitals and nursing note reviewed.  ?HENT:  ?   Head: Normocephalic and atraumatic.  ?   Right Ear: External ear normal.  ?   Left Ear: External ear normal.  ?   Nose: Nose normal.  ?   Mouth/Throat:  ?   Mouth: Mucous membranes are moist.  ?Eyes:  ?   Extraocular Movements: Extraocular movements intact.  ?Cardiovascular:  ?   Rate and Rhythm: Normal rate.  ?   Pulses: Normal pulses.  ?Pulmonary:  ?   Effort: Pulmonary effort is normal.  ?Abdominal:  ?   General: Abdomen is flat. There is no distension.  ?Musculoskeletal:     ?   General: Tenderness present.  ?   Cervical back: Tenderness present.  ?   Comments: Discomfort noted with side-to-side rotation. Patient has good strength in the upper extremities including 5 out of 5 strength in wrist extension, long finger flexion and APB. There is no atrophy of the hands intrinsically.  Sensation  intact bilaterally. Negative Hoffman's sign.  ? ?  ?Skin: ?   General: Skin is warm and dry.  ?   Capillary Refill: Capillary refill takes less than 2 seconds.  ?Neurological:  ?   General: No focal deficit present.  ?   Mental Status: He is alert and oriented to person, place, and time.  ?Psychiatric:     ?   Mood and Affect: Mood normal.     ?   Behavior: Behavior normal.  ?  ?Ortho Exam ? ?Imaging: ?No results found. ? ?Past Medical/Family/Surgical/Social History: ?Medications & Allergies reviewed per EMR, new medications updated. ?Patient Active Problem List  ?  Diagnosis Date Noted  ? Cervical spondylosis with radiculopathy 03/05/2021  ? Arthrosis of shoulder region, right 08/24/2018  ? PTSD (post-traumatic stress disorder) 07/28/2018  ? Systolic murmur 54/65/6812  ? OA (osteoarthri

## 2021-08-29 DIAGNOSIS — M65839 Other synovitis and tenosynovitis, unspecified forearm: Secondary | ICD-10-CM | POA: Diagnosis not present

## 2021-08-29 DIAGNOSIS — M79645 Pain in left finger(s): Secondary | ICD-10-CM | POA: Diagnosis not present

## 2021-08-29 DIAGNOSIS — M79632 Pain in left forearm: Secondary | ICD-10-CM | POA: Diagnosis not present

## 2021-08-29 DIAGNOSIS — M25642 Stiffness of left hand, not elsewhere classified: Secondary | ICD-10-CM | POA: Diagnosis not present

## 2021-09-02 ENCOUNTER — Encounter: Payer: Self-pay | Admitting: Physical Therapy

## 2021-09-02 ENCOUNTER — Ambulatory Visit (INDEPENDENT_AMBULATORY_CARE_PROVIDER_SITE_OTHER): Payer: Medicare Other | Admitting: Physical Therapy

## 2021-09-02 ENCOUNTER — Other Ambulatory Visit: Payer: Self-pay

## 2021-09-02 DIAGNOSIS — M542 Cervicalgia: Secondary | ICD-10-CM

## 2021-09-02 DIAGNOSIS — R293 Abnormal posture: Secondary | ICD-10-CM | POA: Diagnosis not present

## 2021-09-02 NOTE — Therapy (Signed)
?OUTPATIENT PHYSICAL THERAPY CERVICAL EVALUATION ? ? ?Patient Name: [name] ?MRN: [id number] ?DOB:1946/01/23, 76 y.o., male ?Today's Date: 09/02/2021 ? ? PT End of Session - 09/02/21 1508   ? ? Visit Number 1   ? Number of Visits 6   ? Date for PT Re-Evaluation 10/14/21   ? Authorization Type Medicare   ? Progress Note Due on Visit 10   ? PT Start Time 0932   ? PT Stop Time 3557   ? PT Time Calculation (min) 37 min   ? Activity Tolerance Patient tolerated treatment well   ? Behavior During Therapy [facility] for tasks assessed/performed   ? ?  ?  ? ?  ? ? ?Past Medical History:  ?Diagnosis Date  ? AR (allergic rhinitis)   ? Arthritis   ? PAIN AND OA LEFT KNEE ;  S/P RIGHT TOTAL KNEE REPLACEMENT  ? Complication of anesthesia   ? STATES SEVERE PAIN WAKING UP AFTER GENERAL ANESTHESIA; AND FEELS BAD.  ? Dislocation of finger, interphalangeal joint, left, closed 2022  ? left ring finger  ? ED (erectile dysfunction)   ? GERD (gastroesophageal reflux disease)   ? Gout   ? per pt  ? History of kidney stones   ? History of nephrolithiasis   ? seen Dr. Olena Heckle [facility]rology, last 02/2011  ? Hypercholesterolemia   ? RBBB (right bundle branch block)   ? Squamous cell skin cancer 07/2019  ? L clavicle Sharyn Lull [place])  ? ?Past Surgical History:  ?Procedure Laterality Date  ? 2D echo  32/20  ? mild diastolic dysfunction, nl EF   ? COLONOSCOPY  05/2006  ? normal, rpt 10 yrs Amedeo Plenty, [place])  ? KNEE ARTHROSCOPY Right 2000  ? L thumb joint replace Left 2005  ? GRAMIG   ? [place]  ? TOTAL HIP ARTHROPLASTY Right 08/13/2016  ? Procedure: RIGHT TOTAL HIP ARTHROPLASTY ANTERIOR APPROACH;  Surgeon: Gaynelle Arabian, MD;  Location: [facility];  Service: Orthopedics;  Laterality: Right;  ? TOTAL KNEE ARTHROPLASTY Right 12/2010  ? TOTAL KNEE ARTHROPLASTY Left 05/08/2014  ? Procedure: LEFT TOTAL KNEE ARTHROPLASTY;  Surgeon: Gearlean Alf, MD;  Location: [facility];  Service: Orthopedics;  Laterality: Left;  ? ?Patient Active  Problem List  ? Diagnosis Date Noted  ? Cervical spondylosis with radiculopathy 03/05/2021  ? Arthrosis of shoulder region, right 08/24/2018  ? PTSD (post-traumatic stress disorder) 07/28/2018  ? Systolic murmur 25/42/7062  ? OA (osteoarthritis) of hip 04/23/2016  ? Chronic gout 11/30/2015  ? Actinic keratosis 06/29/2015  ? Obesity, Class I, BMI 30-34.9 06/29/2015  ? Advanced care planning/counseling discussion 06/21/2014  ? OA (osteoarthritis) of knee 05/08/2014  ? GERD (gastroesophageal reflux disease) 05/04/2014  ? RBBB 01/18/2014  ? Transient global amnesia 12/06/2012  ? Impaired fasting blood sugar 06/16/2011  ? Kidney stone 03/24/2011  ? Constipation 03/24/2011  ? HYPERCHOLESTEROLEMIA 04/16/2009  ? ALLERGIC RHINITIS 04/16/2009  ? ? ?PCP: Ria Bush, MD ? ?REFERRING PROVIDER: Lorine Bears, NP ? ?REFERRING DIAG: M54.2 (ICD-10-CM) - Cervicalgia M79.18 (ICD-10-CM) - Myofascial pain syndrome M47.812 (ICD-10-CM) - Spondylosis without myelopathy or radiculopathy, cervical region M47.812 (ICD-10-CM) - Facet hypertrophy of cervical region  ? ?THERAPY DIAG:  ?Cervicalgia - Plan: PT plan of care cert/re-cert ? ?Abnormal posture - Plan: PT plan of care cert/re-cert ? ?ONSET DATE: chronic ? ?SUBJECTIVE:                                                                                                                                                                                                        ? ?  SUBJECTIVE STATEMENT: ?KENRY Pena is a 76 y.o. male who comes in today for evaluation of chronic, worsening and severe right sided neck pain radiating to right shoulder.  Patient reports pain has been ongoing for several months and is exacerbated by movement and activity, his pain increases significantly when he rotates his neck to the right side.  He had ESI in Dec 2022 which he reports did not help.  He has another injection scheduled for 09/10/21.   ? ?PERTINENT HISTORY:  ?Cervical spondylosis with  radiculopathy, PTSD, OA, obesity, RBBB ? ?PAIN:  ?Are you having pain? Yes: NPRS scale: 0 currently, up to 7/10 ?Pain location: Rt side neck/shoulder ?Pain description: chronic, aching, sharp ?Aggravating factors: turning head, raising arm overhead, rotating to Rt ?Relieving factors: aleve ? ?PRECAUTIONS: None ? ?WEIGHT BEARING RESTRICTIONS No ? ?FALLS:  ?Has patient fallen in last 6 months? Yes. Number of falls 1 ? ?LIVING ENVIRONMENT: ?Lives with: lives with their spouse ?Lives in: House/apartment ?Stairs:  n/a ? ?OCCUPATION: disability with [employer] ? ?PLOF: Independent and Leisure: play golf, shooting range; goes to [employer]) ? ?PATIENT GOALS improve pain, mobility ? ?OBJECTIVE:  ? ?DIAGNOSTIC FINDINGS:  ?cervical MRI from 2022 exhibits multi-level facet hypertrophy, mild to moderate spinal canal stenosis noted at C6-C7 and mild spinal canal stenosis noted at C7-T1  ? ?PATIENT SURVEYS:  ?09/02/21: FOTO 63 (predicted 68) ? ? ?COGNITION: ?Overall cognitive status: Within functional limits for tasks assessed ? ? ?SENSATION: ?WFL ? ?POSTURE:  ?Rounded shoulders, forward head, appears to have loss of cervical lordosis  ? ?PALPATION: ?Trigger points and tightness noted with Rt cervical paraspinals and upper traps/levator scapula  ? ?CERVICAL ROM:  ? ?Active ROM A/PROM (deg) ?09/02/2021  ?Flexion 34  ?Extension 10 (pain)  ?Right lateral flexion 18 (pain)  ?Left lateral flexion 30  ?Right rotation 20 (pain)  ?Left rotation 47  ? (Blank rows = not tested) ? ?UE ROM: WNL ? ?UE MMT: ? ?MMT Right ?09/02/2021 Left ?09/02/2021  ?Shoulder flexion 5/5 5/5  ?Shoulder extension    ?Shoulder abduction 4/5 5/5  ?Shoulder adduction    ?Shoulder extension    ?Shoulder internal rotation    ?Shoulder external rotation    ? (Blank rows = not tested) ? ?CERVICAL SPECIAL TESTS:  ?09/02/21: Spurling's test: Negative and Distraction test: Negative ? ? ?TODAY'S TREATMENT:  ?09/02/21: See HEP - performed reps with mod cues for  technique ?Manual: STM with compression to Rt cervical paraspinals; skilled palpation and monitoring of soft tissue during DN ?Trigger Point Dry-Needling  ?Treatment instructions: Expect mild to moderate muscle soreness. S/S of pneumothorax if dry needled over a lung field, and to seek immediate medical attention should they occur. Patient verbalized understanding of these instructions and education. ? ?Patient Consent Given: Yes ?Education handout provided: Yes ?Muscles treated: Rt cervical paraspinals ?Electrical stimulation performed: No ?Parameters: N/A ?Treatment response/outcome: twitch response, decreased tightness reported ? ? ? ?PATIENT EDUCATION:  ?Education details: HEP ?Person educated: Patient and Spouse ?Education method: Explanation, Demonstration, and Handouts ?Education comprehension: verbalized understanding, returned demonstration, and needs further education ? ? ?HOME EXERCISE PROGRAM: ?Access Code: [email or web address] ?URL: [email or web address] ?Date: 09/02/2021 ?Prepared by: Faustino Congress ? ?Exercises ?- Half Neck Circles  - 2 x daily - 7 x weekly - 1 sets - 10 reps ?- Standing Backward Shoulder Rolls  - 2 x daily - 7 x weekly - 1 sets - 10 reps ?- Seated Scapular Retraction  - 2 x daily - 7 x weekly -  1 sets - 10 reps - 5 sec hold ? ?ASSESSMENT: ? ?CLINICAL IMPRESSION: ?Patient is a 76 y.o. male who was seen today for physical therapy evaluation and treatment for neck pain.  He demonstrates mild strength deficits, decreased ROM, postural abnormalities and active trigger points affecting function.  Pt will benefit from PT to address deficits listed. ? ? ?OBJECTIVE IMPAIRMENTS decreased ROM, decreased strength, hypomobility, increased fascial restrictions, increased muscle spasms, impaired UE functional use, postural dysfunction, and pain.  ? ?ACTIVITY LIMITATIONS community activity, driving, and yard work.  ? ?PERSONAL FACTORS Time since onset of injury/illness/exacerbation and 3+  comorbidities: Cervical spondylosis with radiculopathy, PTSD, OA, obesity, RBBB  are also affecting patient's functional outcome.  ? ? ?REHAB POTENTIAL: Good ? ?CLINICAL DECISION MAKING: Evolving/moderate complexity ? ?EVALUATI

## 2021-09-09 ENCOUNTER — Encounter: Payer: Self-pay | Admitting: Physical Therapy

## 2021-09-09 ENCOUNTER — Ambulatory Visit (INDEPENDENT_AMBULATORY_CARE_PROVIDER_SITE_OTHER): Payer: Medicare Other | Admitting: Physical Medicine and Rehabilitation

## 2021-09-09 ENCOUNTER — Encounter: Payer: Self-pay | Admitting: Physical Medicine and Rehabilitation

## 2021-09-09 ENCOUNTER — Ambulatory Visit (INDEPENDENT_AMBULATORY_CARE_PROVIDER_SITE_OTHER): Payer: Medicare Other | Admitting: Physical Therapy

## 2021-09-09 ENCOUNTER — Ambulatory Visit: Payer: Self-pay

## 2021-09-09 DIAGNOSIS — M542 Cervicalgia: Secondary | ICD-10-CM

## 2021-09-09 DIAGNOSIS — R293 Abnormal posture: Secondary | ICD-10-CM | POA: Diagnosis not present

## 2021-09-09 DIAGNOSIS — M47812 Spondylosis without myelopathy or radiculopathy, cervical region: Secondary | ICD-10-CM | POA: Diagnosis not present

## 2021-09-09 MED ORDER — METHYLPREDNISOLONE ACETATE 80 MG/ML IJ SUSP: 80.0000 mg | Freq: Once | INTRAMUSCULAR | Status: DC

## 2021-09-09 NOTE — Therapy (Signed)
?OUTPATIENT PHYSICAL THERAPY TREATMENT NOTE ? ? ?Patient Name: [name] ?MRN: [id number] ?DOB:1946/02/27, 76 y.o., male ?Today's Date: 09/09/2021 ? ?PCP: Ria Bush, MD ?REFERRING PROVIDER: Lorine Bears, NP ? ?END OF SESSION:  ? PT End of Session - 09/09/21 1347   ? ? Visit Number 2   ? Number of Visits 6   ? Date for PT Re-Evaluation 10/14/21   ? Authorization Type Medicare   ? Progress Note Due on Visit 10   ? PT Start Time 1345   ? PT Stop Time 1426   ? PT Time Calculation (min) 41 min   ? Activity Tolerance Patient tolerated treatment well   ? Behavior During Therapy [facility] for tasks assessed/performed   ? ?  ?  ? ?  ? ? ?Past Medical History:  ?Diagnosis Date  ? AR (allergic rhinitis)   ? Arthritis   ? PAIN AND OA LEFT KNEE ;  S/P RIGHT TOTAL KNEE REPLACEMENT  ? Complication of anesthesia   ? STATES SEVERE PAIN WAKING UP AFTER GENERAL ANESTHESIA; AND FEELS BAD.  ? Dislocation of finger, interphalangeal joint, left, closed 2022  ? left ring finger  ? ED (erectile dysfunction)   ? GERD (gastroesophageal reflux disease)   ? Gout   ? per pt  ? History of kidney stones   ? History of nephrolithiasis   ? seen Dr. Olena Heckle [facility]rology, last 02/2011  ? Hypercholesterolemia   ? RBBB (right bundle branch block)   ? Squamous cell skin cancer 07/2019  ? L clavicle Sharyn Lull [place])  ? ?Past Surgical History:  ?Procedure Laterality Date  ? 2D echo  93/71  ? mild diastolic dysfunction, nl EF   ? COLONOSCOPY  05/2006  ? normal, rpt 10 yrs Amedeo Plenty, [place])  ? KNEE ARTHROSCOPY Right 2000  ? L thumb joint replace Left 2005  ? GRAMIG   ? [place]  ? TOTAL HIP ARTHROPLASTY Right 08/13/2016  ? Procedure: RIGHT TOTAL HIP ARTHROPLASTY ANTERIOR APPROACH;  Surgeon: Gaynelle Arabian, MD;  Location: [facility];  Service: Orthopedics;  Laterality: Right;  ? TOTAL KNEE ARTHROPLASTY Right 12/2010  ? TOTAL KNEE ARTHROPLASTY Left 05/08/2014  ? Procedure: LEFT TOTAL KNEE ARTHROPLASTY;  Surgeon: Gearlean Alf, MD;   Location: [facility];  Service: Orthopedics;  Laterality: Left;  ? ?Patient Active Problem List  ? Diagnosis Date Noted  ? Cervical spondylosis with radiculopathy 03/05/2021  ? Arthrosis of shoulder region, right 08/24/2018  ? PTSD (post-traumatic stress disorder) 07/28/2018  ? Systolic murmur 69/67/8938  ? OA (osteoarthritis) of hip 04/23/2016  ? Chronic gout 11/30/2015  ? Actinic keratosis 06/29/2015  ? Obesity, Class I, BMI 30-34.9 06/29/2015  ? Advanced care planning/counseling discussion 06/21/2014  ? OA (osteoarthritis) of knee 05/08/2014  ? GERD (gastroesophageal reflux disease) 05/04/2014  ? RBBB 01/18/2014  ? Transient global amnesia 12/06/2012  ? Impaired fasting blood sugar 06/16/2011  ? Kidney stone 03/24/2011  ? Constipation 03/24/2011  ? HYPERCHOLESTEROLEMIA 04/16/2009  ? ALLERGIC RHINITIS 04/16/2009  ? ? ?REFERRING DIAG: M54.2 (ICD-10-CM) - Cervicalgia M79.18 (ICD-10-CM) - Myofascial pain syndrome M47.812 (ICD-10-CM) - Spondylosis without myelopathy or radiculopathy, cervical region M47.812 (ICD-10-CM) - Facet hypertrophy of cervical region  ? ?THERAPY DIAG:  ?Cervicalgia ? ?Abnormal posture ? ?PERTINENT HISTORY: Cervical spondylosis with radiculopathy, PTSD, OA, obesity, RBBB ? ?PRECAUTIONS: None ? ?SUBJECTIVE: hand doctor Rx prednisone, so he's feeling great.  Pain is improved; felt DN was helpful but hard to tell with being on prednisone. Doing HEP  1x/day; not hurting but unsure if they are helping ? ?PAIN:  ?Are you having pain? No; reports stiffness ? ? ? ?OBJECTIVE:  ?  ?PATIENT SURVEYS:  ?09/02/21: FOTO 63 (predicted 68) ? ?PALPATION: ?Trigger points and tightness noted with Rt cervical paraspinals and upper traps/levator scapula           ?  ?CERVICAL ROM:  ?  ?Active ROM A/PROM (deg) ?09/02/2021  ?Flexion 34  ?Extension 10 (pain)  ?Right lateral flexion 18 (pain)  ?Left lateral flexion 30  ?Right rotation 20 (pain)  ?Left rotation 47  ? (Blank rows = not tested) ?  ?UE ROM: WNL ?  ?UE MMT: ?  ?MMT  Right ?09/02/2021 Left ?09/02/2021  ?Shoulder flexion 5/5 5/5  ?Shoulder extension      ?Shoulder abduction 4/5 5/5  ?Shoulder adduction      ?Shoulder extension      ?Shoulder internal rotation      ?Shoulder external rotation      ? (Blank rows = not tested) ?  ?CERVICAL SPECIAL TESTS:  ?09/02/21: Spurling's test: Negative and Distraction test: Negative ?  ?  ?TODAY'S TREATMENT:  ?09/09/21 ?Therex: ?    Sitting: ?Neck circles x 15 reps ?Shoulder rolls backward x 15 rep ?Scapular retraction x 15 reps ?Bil ER with L2 band 15 reps x 5 sec hold ?    Standing: ?Rows L2 band x 15 reps; 5 sec hold ?Ext L2 band x 15 reps ? ?Manual: STM with compression to Rt cervical paraspinals and upper traps; skilled palpation and monitoring of soft tissue during DN ?Trigger Point Dry-Needling  ?Treatment instructions: Expect mild to moderate muscle soreness. S/S of pneumothorax if dry needled over a lung field, and to seek immediate medical attention should they occur. Patient verbalized understanding of these instructions and education. ?  ?Patient Consent Given: Yes ?Education handout provided: Yes ?Muscles treated: Rt cervical paraspinals, upper trap ?Electrical stimulation performed: No ?Parameters: N/A ?Treatment response/outcome: twitch response, decreased tightness reported ? ?09/02/21: See HEP - performed reps with mod cues for technique ?Manual: STM with compression to Rt cervical paraspinals; skilled palpation and monitoring of soft tissue during DN ?Trigger Point Dry-Needling  ?Treatment instructions: Expect mild to moderate muscle soreness. S/S of pneumothorax if dry needled over a lung field, and to seek immediate medical attention should they occur. Patient verbalized understanding of these instructions and education. ?  ?Patient Consent Given: Yes ?Education handout provided: Yes ?Muscles treated: Rt cervical paraspinals ?Electrical stimulation performed: No ?Parameters: N/A ?Treatment response/outcome: twitch response,  decreased tightness reported ?  ?  ?  ?PATIENT EDUCATION:  ?Education details: HEP ?Person educated: Patient and Spouse ?Education method: Explanation, Demonstration, and Handouts ?Education comprehension: verbalized understanding, returned demonstration, and needs further education ?  ?  ?HOME EXERCISE PROGRAM: ?Access Code: [email or web address] ?URL: [email or web address] ?Date: 09/02/2021 ?Prepared by: Faustino Congress ?  ?Exercises ?- Half Neck Circles  - 2 x daily - 7 x weekly - 1 sets - 10 reps ?- Standing Backward Shoulder Rolls  - 2 x daily - 7 x weekly - 1 sets - 10 reps ?- Seated Scapular Retraction  - 2 x daily - 7 x weekly - 1 sets - 10 reps - 5 sec hold ?  ?ASSESSMENT: ?  ?CLINICAL IMPRESSION: ?Pt tolerated session well with reduction in pain noted, and feels DN has been beneficial.  Reviewed HEP needing min cues today.  Will continue to benefit from PT to maximize function. ? ? ?OBJECTIVE IMPAIRMENTS decreased ROM, decreased  strength, hypomobility, increased fascial restrictions, increased muscle spasms, impaired UE functional use, postural dysfunction, and pain.  ?  ?ACTIVITY LIMITATIONS community activity, driving, and yard work.  ?  ?PERSONAL FACTORS Time since onset of injury/illness/exacerbation and 3+ comorbidities: Cervical spondylosis with radiculopathy, PTSD, OA, obesity, RBBB  are also affecting patient's functional outcome.  ?  ?  ?REHAB POTENTIAL: Good ?  ?CLINICAL DECISION MAKING: Evolving/moderate complexity ?  ?EVALUATION COMPLEXITY: Low ?  ?  ?GOALS: ?Goals reviewed with patient? Yes ?  ?SHORT TERM GOALS: Target date: 09/23/2021 ?  ?Independent with initial HEP ?Goal status: INITIAL ?  ?  ?LONG TERM GOALS: Target date: 10/14/2021 ?  ?Independent with final HEP ?Goal status: INITIAL ?  ?2.  FOTO score improved to 68 ?Goal status: INITIAL ?  ?3.  Rt cervical rotation AROM improved to 40 deg for improved function and mobility ?Goal status: INIITAL ?  ?4.  Report pain < 3/10 with exercise  and overhead activity for improved function ?Goal status: INITIAL ?  ?  ?PLAN: ?PT FREQUENCY: 1x/week ?  ?PT DURATION: 6 weeks ?  ?PLANNED INTERVENTIONS: Therapeutic exercises, Therapeutic activity, Neu

## 2021-09-09 NOTE — Patient Instructions (Signed)

## 2021-09-09 NOTE — Progress Notes (Signed)
Pt state neck pain that travels to his right shoulder. Pt state turning his head and laying down makes the pain worse. Pt state he takes over the counter pain meds to help ease his pain. ? ?Numeric Pain Rating Scale and Functional Assessment ?Average Pain 2 ? ? ?In the last MONTH (on 0-10 scale) has pain interfered with the following? ? ?1. General activity like being  able to carry out your everyday physical activities such as walking, climbing stairs, carrying groceries, or moving a chair?  ?Rating(7) ? ? ?+Driver, -BT, -Dye Allergies. ? ?

## 2021-09-14 ENCOUNTER — Other Ambulatory Visit: Payer: Self-pay | Admitting: Family Medicine

## 2021-09-16 ENCOUNTER — Encounter: Payer: Self-pay | Admitting: Physical Therapy

## 2021-09-16 ENCOUNTER — Ambulatory Visit (INDEPENDENT_AMBULATORY_CARE_PROVIDER_SITE_OTHER): Payer: Medicare Other | Admitting: Physical Therapy

## 2021-09-16 DIAGNOSIS — R293 Abnormal posture: Secondary | ICD-10-CM

## 2021-09-16 DIAGNOSIS — M542 Cervicalgia: Secondary | ICD-10-CM

## 2021-09-16 NOTE — Therapy (Signed)
?OUTPATIENT PHYSICAL THERAPY TREATMENT NOTE ? ? ?Patient Name: [name] ?MRN: [id number] ?DOB:December 17, 1945, 76 y.o., male ?Today's Date: 09/16/2021 ? ?PCP: Ria Bush, MD ?REFERRING PROVIDER: Lorine Bears, NP ? ?END OF SESSION:  ? PT End of Session - 09/16/21 1355   ? ? Visit Number 3   ? Number of Visits 6   ? Date for PT Re-Evaluation 10/14/21   ? Authorization Type Medicare   ? Progress Note Due on Visit 10   ? PT Start Time 1350   ? PT Stop Time 1428   ? PT Time Calculation (min) 38 min   ? Activity Tolerance Patient tolerated treatment well   ? Behavior During Therapy [employer] for tasks assessed/performed   ? ?  ?  ? ?  ? ? ? ?Past Medical History:  ?Diagnosis Date  ? AR (allergic rhinitis)   ? Arthritis   ? PAIN AND OA LEFT KNEE ;  S/P RIGHT TOTAL KNEE REPLACEMENT  ? Complication of anesthesia   ? STATES SEVERE PAIN WAKING UP AFTER GENERAL ANESTHESIA; AND FEELS BAD.  ? Dislocation of finger, interphalangeal joint, left, closed 2022  ? left ring finger  ? ED (erectile dysfunction)   ? GERD (gastroesophageal reflux disease)   ? Gout   ? per pt  ? History of kidney stones   ? History of nephrolithiasis   ? seen Dr. [facility], last 02/2011  ? Hypercholesterolemia   ? RBBB (right bundle branch block)   ? Squamous cell skin cancer 07/2019  ? L clavicle Sharyn Lull [place])  ? ?Past Surgical History:  ?Procedure Laterality Date  ? 2D echo  06/30  ? mild diastolic dysfunction, nl EF   ? COLONOSCOPY  05/2006  ? normal, rpt 10 yrs Amedeo Plenty, [place])  ? KNEE ARTHROSCOPY Right 2000  ? L thumb joint replace Left 2005  ? GRAMIG   ? Tiger  ? TOTAL HIP ARTHROPLASTY Right 08/13/2016  ? Procedure: RIGHT TOTAL HIP ARTHROPLASTY ANTERIOR APPROACH;  Surgeon: Gaynelle Arabian, MD;  Location: [facility];  Service: Orthopedics;  Laterality: Right;  ? TOTAL KNEE ARTHROPLASTY Right 12/2010  ? TOTAL KNEE ARTHROPLASTY Left 05/08/2014  ? Procedure: LEFT TOTAL KNEE ARTHROPLASTY;  Surgeon: Gearlean Alf,  MD;  Location: [facility];  Service: Orthopedics;  Laterality: Left;  ? ?Patient Active Problem List  ? Diagnosis Date Noted  ? Cervical spondylosis with radiculopathy 03/05/2021  ? Arthrosis of shoulder region, right 08/24/2018  ? PTSD (post-traumatic stress disorder) 07/28/2018  ? Systolic murmur 16/05/930  ? OA (osteoarthritis) of hip 04/23/2016  ? Chronic gout 11/30/2015  ? Actinic keratosis 06/29/2015  ? Obesity, Class I, BMI 30-34.9 06/29/2015  ? Advanced care planning/counseling discussion 06/21/2014  ? OA (osteoarthritis) of knee 05/08/2014  ? GERD (gastroesophageal reflux disease) 05/04/2014  ? RBBB 01/18/2014  ? Transient global amnesia 12/06/2012  ? Impaired fasting blood sugar 06/16/2011  ? Kidney stone 03/24/2011  ? Constipation 03/24/2011  ? HYPERCHOLESTEROLEMIA 04/16/2009  ? ALLERGIC RHINITIS 04/16/2009  ? ? ?REFERRING DIAG: M54.2 (ICD-10-CM) - Cervicalgia M79.18 (ICD-10-CM) - Myofascial pain syndrome M47.812 (ICD-10-CM) - Spondylosis without myelopathy or radiculopathy, cervical region M47.812 (ICD-10-CM) - Facet hypertrophy of cervical region  ? ?THERAPY DIAG:  ?Cervicalgia ? ?Abnormal posture ? ?PERTINENT HISTORY: Cervical spondylosis with radiculopathy, PTSD, OA, obesity, RBBB ? ?PRECAUTIONS: None ? ?SUBJECTIVE: felt really good after last session, then had injection following last session.  Reports no pain for several days, and now pain is nearly back  to what it was before.   ? ?PAIN:  ?Are you having pain? No; reports stiffness ? ? ? ?OBJECTIVE:  ?  ?PATIENT SURVEYS:  ?09/02/21: FOTO 63 (predicted 68) ?09/16/21: FOTO 65 ? ?PALPATION: ?Trigger points and tightness noted with Rt cervical paraspinals and upper traps/levator scapula           ?  ?CERVICAL ROM:  ?  ?Active ROM A/PROM (deg) ?09/02/2021  ?Flexion 34  ?Extension 10 (pain)  ?Right lateral flexion 18 (pain)  ?Left lateral flexion 30  ?Right rotation 20 (pain)  ?Left rotation 47  ? (Blank rows = not tested) ?  ?UE ROM: WNL ?  ?UE MMT: ?  ?MMT  Right ?09/02/2021 Left ?09/02/2021  ?Shoulder flexion 5/5 5/5  ?Shoulder extension      ?Shoulder abduction 4/5 5/5  ?Shoulder adduction      ?Shoulder extension      ?Shoulder internal rotation      ?Shoulder external rotation      ? (Blank rows = not tested) ?  ?CERVICAL SPECIAL TESTS:  ?09/02/21: Spurling's test: Negative and Distraction test: Negative ?  ?  ?TODAY'S TREATMENT:  ?09/16/21 ?Discussed recent injection and response; also discussed anti-inflammatory techniques including increasing HR with low impact cardio, decreasing sugar intake and healthy sleep habits.  Pt/wife verbalize understanding.  Also discussed current PT progress (FOTO) and POC, plan to see at least 1 more visit and will determine if additional visits are indicated. ?Manual: STM with compression to Rt cervical paraspinals and upper traps; skilled palpation and monitoring of soft tissue during DN ?Trigger Point Dry-Needling  ?Treatment instructions: Expect mild to moderate muscle soreness. S/S of pneumothorax if dry needled over a lung field, and to seek immediate medical attention should they occur. Patient verbalized understanding of these instructions and education. ?Patient Consent Given: Yes ?Education handout provided: Yes ?Muscles treated: Rt cervical paraspinals, upper trap ?Electrical stimulation performed: No ?Parameters: N/A ?Treatment response/outcome: twitch response, decreased tightness reported ? ? ?09/09/21 ?Therex: ?    Sitting: ?Neck circles x 15 reps ?Shoulder rolls backward x 15 rep ?Scapular retraction x 15 reps ?Bil ER with L2 band 15 reps x 5 sec hold ?    Standing: ?Rows L2 band x 15 reps; 5 sec hold ?Ext L2 band x 15 reps ? ?Manual: STM with compression to Rt cervical paraspinals and upper traps; skilled palpation and monitoring of soft tissue during DN ?Trigger Point Dry-Needling  ?Treatment instructions: Expect mild to moderate muscle soreness. S/S of pneumothorax if dry needled over a lung field, and to seek  immediate medical attention should they occur. Patient verbalized understanding of these instructions and education. ?  ?Patient Consent Given: Yes ?Education handout provided: Yes ?Muscles treated: Rt cervical paraspinals, upper trap ?Electrical stimulation performed: No ?Parameters: N/A ?Treatment response/outcome: twitch response, decreased tightness reported ? ?09/02/21: See HEP - performed reps with mod cues for technique ?Manual: STM with compression to Rt cervical paraspinals; skilled palpation and monitoring of soft tissue during DN ?Trigger Point Dry-Needling  ?Treatment instructions: Expect mild to moderate muscle soreness. S/S of pneumothorax if dry needled over a lung field, and to seek immediate medical attention should they occur. Patient verbalized understanding of these instructions and education. ?  ?Patient Consent Given: Yes ?Education handout provided: Yes ?Muscles treated: Rt cervical paraspinals ?Electrical stimulation performed: No ?Parameters: N/A ?Treatment response/outcome: twitch response, decreased tightness reported ?  ?  ?  ?PATIENT EDUCATION:  ?Education details: HEP ?Person educated: Patient and Spouse ?Education method:  Explanation, Demonstration, and Handouts ?Education comprehension: verbalized understanding, returned demonstration, and needs further education ?  ?  ?HOME EXERCISE PROGRAM: ?Access Code: [email or web address] ?URL: [email or web address] ?Date: 09/02/2021 ?Prepared by: Faustino Congress ?  ?Exercises ?- Half Neck Circles  - 2 x daily - 7 x weekly - 1 sets - 10 reps ?- Standing Backward Shoulder Rolls  - 2 x daily - 7 x weekly - 1 sets - 10 reps ?- Seated Scapular Retraction  - 2 x daily - 7 x weekly - 1 sets - 10 reps - 5 sec hold ?  ?ASSESSMENT: ?  ?CLINICAL IMPRESSION: ?Pt reporting getting relief from injection for a couple of days but pain has returned to near baseline at this time.  Overall feel pt is able to get short term benefit but we aren't seeing  significant carryover at this time.  Will plan to see at least 1 more visit to determine how he's doing and decide if additional visits are indicated.   ? ?OBJECTIVE IMPAIRMENTS decreased ROM, decreased strength, hypomo

## 2021-09-18 NOTE — Progress Notes (Signed)
? ?[name] - 76 y.o. male MRN [id number]  Date of birth: 1946-01-27 ? ?Office Visit Note: ?Visit Date: 09/09/2021 ?PCP: Ria Bush, MD ?Referred by: Ria Bush, MD ? ?Subjective: ?Chief Complaint  ?Patient presents with  ? Neck - Pain  ? Right Shoulder - Pain  ? ?HPI:  [name] is a 76 y.o. male who comes in today at the request of Barnet Pall, FNP for planned Right  C5-6 and C6-7 Cervical facet/medial branch block with fluoroscopic guidance.  The patient has failed conservative care including home exercise, medications, time and activity modification.  This injection will be diagnostic and hopefully therapeutic.  Please see requesting physician notes for further details and justification.  Exam has shown concordant pain with facet joint loading.  Prior cervical epidural injection was not very beneficial.  Patient gets mostly axial pain is described some referral shoulder.  He has been in physical therapy and has had dry needling.  For session of dry needling was not very helpful but session this morning actually seem to help him quite a bit from a motion standpoint.  He will continue with physical therapy and dry needling. ? ? ? ?ROS Otherwise per HPI. ? ?Assessment & Plan: ?Visit Diagnoses:  ?  ICD-10-CM   ?1. Spondylosis without myelopathy or radiculopathy, cervical region  M47.812 XR C-ARM NO REPORT  ?  Facet Injection  ?  DISCONTINUED: methylPREDNISolone acetate (DEPO-MEDROL) injection 80 mg  ?  ?2. Cervicalgia  M54.2 XR C-ARM NO REPORT  ?  Facet Injection  ?  DISCONTINUED: methylPREDNISolone acetate (DEPO-MEDROL) injection 80 mg  ?  ?  ?Plan: No additional findings.  ? ?Meds & Orders:  ?Meds ordered this encounter  ?Medications  ? DISCONTD: methylPREDNISolone acetate (DEPO-MEDROL) injection 80 mg  ?  ?Orders Placed This Encounter  ?Procedures  ? Facet Injection  ? XR C-ARM NO REPORT  ?  ?Follow-up: Return if symptoms worsen or fail to improve.  ? ?Procedures: ?No procedures  performed  ?Diagnostic Cervical Facet Joint Nerve Block with Fluoroscopic Guidance ? ?Patient: [name]      ?Date of Birth: Oct 16, 1945 ?MRN: [id number] ?PCP: Ria Bush, MD      ?Visit Date: 09/09/2021 ?  ?Universal Protocol:    ?Date/Time: 04/26/235:26 AM ? ?Consent Given By: the patient ? ?Position: PRONE ? ?Additional Comments: ?Vital signs were monitored before and after the procedure. ?Patient was prepped and draped in the usual sterile fashion. ?The correct patient, procedure, and site was verified. ? ? ?Injection Procedure Details:  ? ?Procedure diagnoses: Spondylosis without myelopathy or radiculopathy, cervical region [M47.812]  ? ?Meds Administered:  ?Meds ordered this encounter  ?Medications  ? methylPREDNISolone acetate (DEPO-MEDROL) injection 80 mg  ?  ? ?Laterality: Right ? ?Location/Site:  ?C5-6 ?C6-7 ? ?Needle size: 25 G ? ?Needle type: Spinal ? ?Needle Placement: Articular Pillar ? ?Findings: ? -Contrast Used: 0.5 mL iohexol 180 mg iodine/mL  ? -Comments: Excellent flow of contrast across the articular pillars without intravascular flow ? ?Procedure Details: ?The fluoroscope beam was positioned to square off the endplates of the desired vertebral level to achieve a true AP position. The beam was then moved in a small ?counter? oblique to the contralateral side with a small amount of caudal tilt to achieve a trajectory alignment with the desired nerves.  For each target described below the skin was anesthetized with 1 ml of 1% Lidocaine without epinephrine. ?  ?To block the facet joint nerve to C2, the needle was  fluoroscopically positioned over the inferior lateral portion of the C2/3 facet joint nerve where the third occipital nerve (TON) lies. ? ?To block the facet joint nerves from C3 through C7, the lateral masses of these respective levels were localized under fluoroscopic visualization.  A spinal needle was inserted down to the "waist" at the above mentioned cervical levels.   The  ?needle was then "walked off" until it rested just lateral to the trough of the lateral mass of the medial branch nerve, which innervates the cervical facet joint. ? ?To block the C8 facet joint nerve, the needle was fluoroscopically introduced onto the Tl transverse process at its most medial superior end. ? ?After contact with periosteum and negative aspirate for blood and CSF, correct placement without intravascular or epidural spread was confirmed by Bi-planar images and  injecting 0.5 ml. of Omnipaque-240.  A spot radiograph was obtained of this image.  Next, a 0.5 ml. volume of 1% Lidocaine without Epinephrine was then injected. ? ?Prior to the procedure, the patient was given a Pain Diary which was completed for baseline measurements.  After the procedure, the patient rated their pain every 30 minutes and will continue rating at this frequency for a total of 5 hours.  The patient has been asked to complete the Diary and return to [place] by mail, fax or hand delivered as soon as possible. ? ? ?Additional Comments:  ?The patient tolerated the procedure well ?Dressing: Band-Aid ?  ? ?Post-procedure details: ?Patient was observed during the procedure. ?Post-procedure instructions were reviewed. ? ?Patient left the clinic in stable condition. ? ? ?   ? ?Clinical History: ?EXAM: ?MRI CERVICAL SPINE WITHOUT CONTRAST ?  ?TECHNIQUE: ?Multiplanar, multisequence MR imaging of the cervical spine was ?performed. No intravenous contrast was administered. ?  ?COMPARISON:  No prior MRI, ?  ?FINDINGS: ?Alignment: Reversal of the normal cervical lordosis with trace ?retrolisthesis of C6 on C7 and mild anterolisthesis of C7 on T1. ?  ?Vertebrae: No acute fracture or suspicious osseous lesion. ?Congenitally short pedicles, which narrow the AP diameter of the ?spinal canal. ?  ?Cord: Normal signal and morphology. ?  ?Posterior Fossa, vertebral arteries, paraspinal tissues: Large ?posterior fossa arachnoid cyst. Otherwise  negative. ?  ?Disc levels: ?  ?C2-C3: No significant disc bulge. Facet arthropathy. No spinal canal ?stenosis or neuroforaminal narrowing. ?  ?C3-C4: Small central disc protrusion, which abuts the ventral cord. ?No spinal canal stenosis. Uncovertebral and facet arthropathy. ?Severe right and moderate to severe left neural foraminal narrowing. ?  ?C4-C5: Mild disc bulge. Uncovertebral and facet arthropathy. No ?spinal canal stenosis. Severe bilateral neural foraminal narrowing. ?  ?C5-C6: Mild disc bulge. Uncovertebral and facet arthropathy. No ?spinal canal stenosis. Moderate to severe left and mild right neural ?foraminal narrowing. ?  ?C6-C7: Trace retrolisthesis with broad-based disc bulge. Mild to ?moderate spinal canal stenosis. Uncovertebral and facet arthropathy. ?Moderate bilateral neural foraminal narrowing. ?  ?C7-T1: Mild anterolisthesis with disc unroofing. Mild spinal canal ?stenosis. Uncovertebral and facet arthropathy. Moderate to severe ?left and moderate right neural foraminal narrowing. ?  ?IMPRESSION: ?1. C6-C7 mild-to-moderate spinal canal stenosis with moderate ?bilateral neural foraminal narrowing. ?2. C7-T1 mild spinal canal stenosis with moderate to severe left and ?moderate right neural foraminal narrowing. ?3. Multilevel facet and uncovertebral hypertrophy, which causes ?severe neural foraminal narrowing on the right at C3-C4 and ?bilaterally at C4-C5, with additional less severe neural foraminal ?narrowing at multiple levels, as described above. ?  ?  ?Electronically Signed ?  By: Bryson Ha  Vasan M.D. ?  On: 05/02/2021 16:31  ? ? ? ?Objective:  VS:  HT:    WT:   BMI:     BP:   HR: bpm  TEMP: ( )  RESP:  ?Physical Exam ?Vitals and nursing note reviewed.  ?Constitutional:   ?   General: He is not in acute distress. ?   Appearance: Normal appearance. He is not ill-appearing.  ?HENT:  ?   Head: Normocephalic and atraumatic.  ?   Right Ear: External ear normal.  ?   Left Ear: External ear  normal.  ?Eyes:  ?   Extraocular Movements: Extraocular movements intact.  ?Cardiovascular:  ?   Rate and Rhythm: Normal rate.  ?   Pulses: Normal pulses.  ?Abdominal:  ?   General: There is no distension.  ?   Pal

## 2021-09-18 NOTE — Procedures (Signed)
Diagnostic Cervical Facet Joint Nerve Block with Fluoroscopic Guidance ? ?Patient: [name]      ?Date of Birth: [birth year] ?MRN: [id number] ?PCP: Ria Bush, MD      ?Visit Date: 09/09/2021 ?  ?Universal Protocol:    ?Date/Time: 04/26/235:26 AM ? ?Consent Given By: the patient ? ?Position: PRONE ? ?Additional Comments: ?Vital signs were monitored before and after the procedure. ?Patient was prepped and draped in the usual sterile fashion. ?The correct patient, procedure, and site was verified. ? ? ?Injection Procedure Details:  ? ?Procedure diagnoses: Spondylosis without myelopathy or radiculopathy, cervical region [M47.812]  ? ?Meds Administered:  ?Meds ordered this encounter  ?Medications  ? methylPREDNISolone acetate (DEPO-MEDROL) injection 80 mg  ?  ? ?Laterality: Right ? ?Location/Site:  ?C5-6 ?C6-7 ? ?Needle size: 25 G ? ?Needle type: Spinal ? ?Needle Placement: Articular Pillar ? ?Findings: ? -Contrast Used: 0.5 mL iohexol 180 mg iodine/mL  ? -Comments: Excellent flow of contrast across the articular pillars without intravascular flow ? ?Procedure Details: ?The fluoroscope beam was positioned to square off the endplates of the desired vertebral level to achieve a true AP position. The beam was then moved in a small ?counter? oblique to the contralateral side with a small amount of caudal tilt to achieve a trajectory alignment with the desired nerves.  For each target described below the skin was anesthetized with 1 ml of 1% Lidocaine without epinephrine. ?  ?To block the facet joint nerve to C2, the needle was fluoroscopically positioned over the inferior lateral portion of the C2/3 facet joint nerve where the third occipital nerve (TON) lies. ? ?To block the facet joint nerves from C3 through C7, the lateral masses of these respective levels were localized under fluoroscopic visualization.  A spinal needle was inserted down to the "waist" at the above mentioned cervical levels.  The  ?needle  was then "walked off" until it rested just lateral to the trough of the lateral mass of the medial branch nerve, which innervates the cervical facet joint. ? ?To block the C8 facet joint nerve, the needle was fluoroscopically introduced onto the Tl transverse process at its most medial superior end. ? ?After contact with periosteum and negative aspirate for blood and CSF, correct placement without intravascular or epidural spread was confirmed by Bi-planar images and  injecting 0.5 ml. of Omnipaque-240.  A spot radiograph was obtained of this image.  Next, a 0.5 ml. volume of 1% Lidocaine without Epinephrine was then injected. ? ?Prior to the procedure, the patient was given a Pain Diary which was completed for baseline measurements.  After the procedure, the patient rated their pain every 30 minutes and will continue rating at this frequency for a total of 5 hours.  The patient has been asked to complete the Diary and return to [place] by mail, fax or hand delivered as soon as possible. ? ? ?Additional Comments:  ?The patient tolerated the procedure well ?Dressing: Band-Aid ?  ? ?Post-procedure details: ?Patient was observed during the procedure. ?Post-procedure instructions were reviewed. ? ?Patient left the clinic in stable condition. ? ? ?  ?

## 2021-09-23 ENCOUNTER — Encounter: Payer: Self-pay | Admitting: Physical Therapy

## 2021-09-23 ENCOUNTER — Ambulatory Visit (INDEPENDENT_AMBULATORY_CARE_PROVIDER_SITE_OTHER): Payer: Medicare Other | Admitting: Physical Therapy

## 2021-09-23 DIAGNOSIS — R293 Abnormal posture: Secondary | ICD-10-CM | POA: Diagnosis not present

## 2021-09-23 DIAGNOSIS — M542 Cervicalgia: Secondary | ICD-10-CM | POA: Diagnosis not present

## 2021-09-23 NOTE — Therapy (Signed)
?OUTPATIENT PHYSICAL THERAPY TREATMENT NOTE ? ? ?Patient Name: [name] ?MRN: [id number] ?DOB:Mar 22, 1946, 76 y.o., male ?Today's Date: 09/23/2021 ? ?PCP: Ria Bush, MD ?REFERRING PROVIDER: Lorine Bears, NP ? ?END OF SESSION:  ? PT End of Session - 09/23/21 1353   ? ? Visit Number 4   ? Number of Visits 6   ? Date for PT Re-Evaluation 10/14/21   ? Authorization Type Medicare   ? Progress Note Due on Visit 10   ? PT Start Time 1350   ? PT Stop Time 1430   ? PT Time Calculation (min) 40 min   ? Activity Tolerance Patient tolerated treatment well   ? Behavior During Therapy [facility] for tasks assessed/performed   ? ?  ?  ? ?  ? ? ? ? ?Past Medical History:  ?Diagnosis Date  ? AR (allergic rhinitis)   ? Arthritis   ? PAIN AND OA LEFT KNEE ;  S/P RIGHT TOTAL KNEE REPLACEMENT  ? Complication of anesthesia   ? STATES SEVERE PAIN WAKING UP AFTER GENERAL ANESTHESIA; AND FEELS BAD.  ? Dislocation of finger, interphalangeal joint, left, closed 2022  ? left ring finger  ? ED (erectile dysfunction)   ? GERD (gastroesophageal reflux disease)   ? Gout   ? per pt  ? History of kidney stones   ? History of nephrolithiasis   ? seen Dr. Olena [facility], last 02/2011  ? Hypercholesterolemia   ? RBBB (right bundle branch block)   ? Squamous cell skin cancer 07/2019  ? L clavicle Sharyn Lull [place])  ? ?Past Surgical History:  ?Procedure Laterality Date  ? 2D echo  15/17  ? mild diastolic dysfunction, nl EF   ? COLONOSCOPY  05/2006  ? normal, rpt 10 yrs Amedeo Plenty, [place])  ? KNEE ARTHROSCOPY Right 2000  ? L thumb joint replace Left 2005  ? GRAMIG   ? [place]  ? TOTAL HIP ARTHROPLASTY Right 08/13/2016  ? Procedure: RIGHT TOTAL HIP ARTHROPLASTY ANTERIOR APPROACH;  Surgeon: Gaynelle Arabian, MD;  Location: [facility];  Service: Orthopedics;  Laterality: Right;  ? TOTAL KNEE ARTHROPLASTY Right 12/2010  ? TOTAL KNEE ARTHROPLASTY Left 05/08/2014  ? Procedure: LEFT TOTAL KNEE ARTHROPLASTY;  Surgeon: Gearlean Alf,  MD;  Location: [facility];  Service: Orthopedics;  Laterality: Left;  ? ?Patient Active Problem List  ? Diagnosis Date Noted  ? Cervical spondylosis with radiculopathy 03/05/2021  ? Arthrosis of shoulder region, right 08/24/2018  ? PTSD (post-traumatic stress disorder) 07/28/2018  ? Systolic murmur 61/60/7371  ? OA (osteoarthritis) of hip 04/23/2016  ? Chronic gout 11/30/2015  ? Actinic keratosis 06/29/2015  ? Obesity, Class I, BMI 30-34.9 06/29/2015  ? Advanced care planning/counseling discussion 06/21/2014  ? OA (osteoarthritis) of knee 05/08/2014  ? GERD (gastroesophageal reflux disease) 05/04/2014  ? RBBB 01/18/2014  ? Transient global amnesia 12/06/2012  ? Impaired fasting blood sugar 06/16/2011  ? Kidney stone 03/24/2011  ? Constipation 03/24/2011  ? HYPERCHOLESTEROLEMIA 04/16/2009  ? ALLERGIC RHINITIS 04/16/2009  ? ? ?REFERRING DIAG: M54.2 (ICD-10-CM) - Cervicalgia M79.18 (ICD-10-CM) - Myofascial pain syndrome M47.812 (ICD-10-CM) - Spondylosis without myelopathy or radiculopathy, cervical region M47.812 (ICD-10-CM) - Facet hypertrophy of cervical region  ? ?THERAPY DIAG:  ?Cervicalgia ? ?Abnormal posture ? ?PERTINENT HISTORY: Cervical spondylosis with radiculopathy, PTSD, OA, obesity, RBBB ? ?PRECAUTIONS: None ? ?SUBJECTIVE: Pt feels the DN has helped and wishes to continue for one more PT visit before he decides whether to continue PT or hold  for a while. Pt stating today is a "bad day".  ? ?PAIN:  ?Are you having pain? 4/10 in neck and shoulder, pt still reporting stiffness ?Pt reporting worse pain in the morning and after impact with golf ball when playing golf ? ? ? ?OBJECTIVE:  ?  ?PATIENT SURVEYS:  ?09/02/21: FOTO 63 (predicted 68) ?09/16/21: FOTO 65 ? ?PALPATION: ?Trigger points and tightness noted with Rt cervical paraspinals and upper traps/levator scapula           ?  ?CERVICAL ROM:  ?  ?Active ROM A/PROM (deg) ?09/02/2021 AROM ? ?09/23/2021  ?Flexion 34 35  ?Extension 10 (pain) 20  ?Right lateral flexion 18  (pain) 22 (pain)  ?Left lateral flexion 30 30  ?Right rotation 20 (pain) 40 (pain)  ?Left rotation 47 55  ? (Blank rows = not tested) ?  ?UE ROM: WNL ?  ?UE MMT: ?  ?MMT Right ?09/02/2021 Left ?09/02/2021  ?Shoulder flexion 5/5 5/5  ?Shoulder extension      ?Shoulder abduction 4/5 5/5  ?Shoulder adduction      ?Shoulder extension      ?Shoulder internal rotation      ?Shoulder external rotation      ? (Blank rows = not tested) ?  ?CERVICAL SPECIAL TESTS:  ?09/02/21: Spurling's test: Negative and Distraction test: Negative ?  ?  ?TODAY'S TREATMENT:  ?09/23/2021: ?TherEx: ?Rows Level 3 theraband 2 x 10 ?Cervical retraction sitting x 10  ?AAROM: shoulder abduction with golf club ?AAROM: shoulder flexion with golf club  ?AAROM: trunk rotation using golf club behind his back.  ? ?Trigger Point Dry-Needling  ?Treatment instructions: Expect mild to moderate muscle soreness. S/S of pneumothorax if dry needled over a lung field, and to seek immediate medical attention should they occur. Patient verbalized understanding of these instructions and education. ?Patient Consent Given: Yes ?Education handout provided: Yes ?Muscles treated: Rt cervical paraspinals, upper trap ?Electrical stimulation performed: No ?Parameters: N/A ?Treatment response/outcome: twitch response, decreased tightness reported ? ? ?09/16/21 ?Discussed recent injection and response; also discussed anti-inflammatory techniques including increasing HR with low impact cardio, decreasing sugar intake and healthy sleep habits.  Pt/wife verbalize understanding.  Also discussed current PT progress (FOTO) and POC, plan to see at least 1 more visit and will determine if additional visits are indicated. ?Manual: STM with compression to Rt cervical paraspinals and upper traps; skilled palpation and monitoring of soft tissue during DN ?Trigger Point Dry-Needling  ?Treatment instructions: Expect mild to moderate muscle soreness. S/S of pneumothorax if dry needled over a lung  field, and to seek immediate medical attention should they occur. Patient verbalized understanding of these instructions and education. ?Patient Consent Given: Yes ?Education handout provided: Yes ?Muscles treated: Rt cervical paraspinals, upper trap ?Electrical stimulation performed: No ?Parameters: N/A ?Treatment response/outcome: twitch response, decreased tightness reported ? ? ?09/09/21 ?Therex: ?    Sitting: ?Neck circles x 15 reps ?Shoulder rolls backward x 15 rep ?Scapular retraction x 15 reps ?Bil ER with L2 band 15 reps x 5 sec hold ?    Standing: ?Rows L2 band x 15 reps; 5 sec hold ?Ext L2 band x 15 reps ? ?Manual: STM with compression to Rt cervical paraspinals and upper traps; skilled palpation and monitoring of soft tissue during DN ?Trigger Point Dry-Needling  ?Treatment instructions: Expect mild to moderate muscle soreness. S/S of pneumothorax if dry needled over a lung field, and to seek immediate medical attention should they occur. Patient verbalized understanding of these instructions and education. ?  ?  Patient Consent Given: Yes ?Education handout provided: Yes ?Muscles treated: Rt cervical paraspinals, upper trap ?Electrical stimulation performed: No ?Parameters: N/A ?Treatment response/outcome: twitch response, decreased tightness reported ? ?09/02/21: See HEP - performed reps with mod cues for technique ?Manual: STM with compression to Rt cervical paraspinals; skilled palpation and monitoring of soft tissue during DN ?Trigger Point Dry-Needling  ?Treatment instructions: Expect mild to moderate muscle soreness. S/S of pneumothorax if dry needled over a lung field, and to seek immediate medical attention should they occur. Patient verbalized understanding of these instructions and education. ?  ?Patient Consent Given: Yes ?Education handout provided: Yes ?Muscles treated: Rt cervical paraspinals ?Electrical stimulation performed: No ?Parameters: N/A ?Treatment response/outcome: twitch response,  decreased tightness reported ?  ?  ?  ?PATIENT EDUCATION:  ?Education details: HEP ?Person educated: Patient and Spouse ?Education method: Explanation, Demonstration, and Handouts ?Education comprehen

## 2021-09-30 ENCOUNTER — Ambulatory Visit (INDEPENDENT_AMBULATORY_CARE_PROVIDER_SITE_OTHER): Payer: Medicare Other | Admitting: Physical Therapy

## 2021-09-30 ENCOUNTER — Encounter: Payer: Self-pay | Admitting: Physical Therapy

## 2021-09-30 DIAGNOSIS — M542 Cervicalgia: Secondary | ICD-10-CM | POA: Diagnosis not present

## 2021-09-30 DIAGNOSIS — R293 Abnormal posture: Secondary | ICD-10-CM | POA: Diagnosis not present

## 2021-09-30 NOTE — Therapy (Addendum)
OUTPATIENT PHYSICAL THERAPY TREATMENT NOTE DISCHARGE SUMMARY   Patient Name: [name] MRN: [id number] DOB:Aug 21, 1945, 76 y.o., male Today's Date: 09/30/2021  PCP: Eustaquio Boyden, MD REFERRING PROVIDER: Eustaquio Boyden, MD  END OF SESSION:   PT End of Session - 09/30/21 1422     Visit Number 5    Number of Visits 6    Date for PT Re-Evaluation 10/14/21    Authorization Type Medicare    Progress Note Due on Visit 10    PT Start Time 1344    PT Stop Time 1422    PT Time Calculation (min) 38 min    Activity Tolerance Patient tolerated treatment well    Behavior During Therapy WFL for tasks assessed/performed                Past Medical History:  Diagnosis Date   AR (allergic rhinitis)    Arthritis    PAIN AND OA LEFT KNEE ;  S/P RIGHT TOTAL KNEE REPLACEMENT   Complication of anesthesia    STATES SEVERE PAIN WAKING UP AFTER GENERAL ANESTHESIA; AND FEELS BAD.   Dislocation of finger, interphalangeal joint, left, closed 2022   left ring finger   ED (erectile dysfunction)    GERD (gastroesophageal reflux disease)    Gout    per pt   History of kidney stones    History of nephrolithiasis    seen Dr. Garner [facility], last 02/2011   Hypercholesterolemia    RBBB (right bundle branch block)    Squamous cell skin cancer 07/2019   L clavicle [facility] [place])   Past Surgical History:  Procedure Laterality Date   2D echo  10/11   mild diastolic dysfunction, nl EF    COLONOSCOPY  05/2006   normal, rpt 10 yrs [name], [place])   KNEE ARTHROSCOPY Right 2000   L thumb joint replace Left 2005   GRAMIG    TONSILLECTOMY AND ADENOIDECTOMY  1954   TOTAL HIP ARTHROPLASTY Right 08/13/2016   Procedure: RIGHT TOTAL HIP ARTHROPLASTY ANTERIOR APPROACH;  Surgeon: Ollen Gross, MD;  Location: [facility];  Service: Orthopedics;  Laterality: Right;   TOTAL KNEE ARTHROPLASTY Right 12/2010   TOTAL KNEE ARTHROPLASTY Left 05/08/2014   Procedure: LEFT TOTAL KNEE ARTHROPLASTY;   Surgeon: Loanne Drilling, MD;  Location: [facility];  Service: Orthopedics;  Laterality: Left;   Patient Active Problem List   Diagnosis Date Noted   Cervical spondylosis with radiculopathy 03/05/2021   Arthrosis of shoulder region, right 08/24/2018   PTSD (post-traumatic stress disorder) 07/28/2018   Systolic murmur 07/24/2017   OA (osteoarthritis) of hip 04/23/2016   Chronic gout 11/30/2015   Actinic keratosis 06/29/2015   Obesity, Class I, BMI 30-34.9 06/29/2015   Advanced care planning/counseling discussion 06/21/2014   OA (osteoarthritis) of knee 05/08/2014   GERD (gastroesophageal reflux disease) 05/04/2014   RBBB 01/18/2014   Transient global amnesia 12/06/2012   Impaired fasting blood sugar 06/16/2011   Kidney stone 03/24/2011   Constipation 03/24/2011   HYPERCHOLESTEROLEMIA 04/16/2009   ALLERGIC RHINITIS 04/16/2009    REFERRING DIAG: M54.2 (ICD-10-CM) - Cervicalgia M79.18 (ICD-10-CM) - Myofascial pain syndrome M47.812 (ICD-10-CM) - Spondylosis without myelopathy or radiculopathy, cervical region M47.812 (ICD-10-CM) - Facet hypertrophy of cervical region   THERAPY DIAG:  Cervicalgia  Abnormal posture  PERTINENT HISTORY: Cervical spondylosis with radiculopathy, PTSD, OA, obesity, RBBB  PRECAUTIONS: None  SUBJECTIVE: did a lot of driving over past several days, neck pain is worse right now than typical due to this.  PAIN:  Are you having pain? Averaging 3-5/10 in neck and shoulder, pt still reporting stiffness Pt reporting worse pain in the morning and after impact with golf ball when playing golf    OBJECTIVE:    PATIENT SURVEYS:  09/02/21: FOTO 63 (predicted 68) 09/16/21: FOTO 65 09/30/21: FOTO 71  PALPATION: Trigger points and tightness noted with Rt cervical paraspinals and upper traps/levator scapula             CERVICAL ROM:    Active ROM A/PROM (deg) 09/02/2021 AROM  09/23/2021  Flexion 34 35  Extension 10 (pain) 20  Right lateral flexion 18 (pain) 22  (pain)  Left lateral flexion 30 30  Right rotation 20 (pain) 40 (pain)  Left rotation 47 55   (Blank rows = not tested)   UE ROM: WNL   UE MMT:   MMT Right 09/02/2021 Left 09/02/2021  Shoulder flexion 5/5 5/5  Shoulder extension      Shoulder abduction 4/5 5/5  Shoulder adduction      Shoulder extension      Shoulder internal rotation      Shoulder external rotation       (Blank rows = not tested)   CERVICAL SPECIAL TESTS:  09/02/21: Spurling's test: Negative and Distraction test: Negative     TODAY'S TREATMENT:  09/30/21 Trigger Point Dry-Needling  Treatment instructions: Expect mild to moderate muscle soreness. S/S of pneumothorax if dry needled over a lung field, and to seek immediate medical attention should they occur. Patient verbalized understanding of these instructions and education.  Patient Consent Given: Yes Education handout provided: Previously provided Muscles treated: Rt suboccipitals, cervical paraspinals and upper traps Electrical stimulation performed: No Parameters: N/A Treatment response/outcome: twitch response with decreased tightness noted Manual: STM with compression to Rt suboccipitals, cervical paraspinals and upper traps; skilled palpation and monitoring of soft tissue during DN  Therex: Discussed importance of continuation of HEP to maximize progress, pt and wife verbalized understanding     09/23/2021: TherEx: Rows Level 3 theraband 2 x 10 Cervical retraction sitting x 10  AAROM: shoulder abduction with golf club AAROM: shoulder flexion with golf club  AAROM: trunk rotation using golf club behind his back.   Trigger Point Dry-Needling  Treatment instructions: Expect mild to moderate muscle soreness. S/S of pneumothorax if dry needled over a lung field, and to seek immediate medical attention should they occur. Patient verbalized understanding of these instructions and education. Patient Consent Given: Yes Education handout provided:  Yes Muscles treated: Rt cervical paraspinals, upper trap Electrical stimulation performed: No Parameters: N/A Treatment response/outcome: twitch response, decreased tightness reported   09/16/21 Discussed recent injection and response; also discussed anti-inflammatory techniques including increasing HR with low impact cardio, decreasing sugar intake and healthy sleep habits.  Pt/wife verbalize understanding.  Also discussed current PT progress (FOTO) and POC, plan to see at least 1 more visit and will determine if additional visits are indicated. Manual: STM with compression to Rt cervical paraspinals and upper traps; skilled palpation and monitoring of soft tissue during DN Trigger Point Dry-Needling  Treatment instructions: Expect mild to moderate muscle soreness. S/S of pneumothorax if dry needled over a lung field, and to seek immediate medical attention should they occur. Patient verbalized understanding of these instructions and education. Patient Consent Given: Yes Education handout provided: Yes Muscles treated: Rt cervical paraspinals, upper trap Electrical stimulation performed: No Parameters: N/A Treatment response/outcome: twitch response, decreased tightness reported   09/09/21 Therex:     Sitting: Neck  circles x 15 reps Shoulder rolls backward x 15 rep Scapular retraction x 15 reps Bil ER with L2 band 15 reps x 5 sec hold     Standing: Rows L2 band x 15 reps; 5 sec hold Ext L2 band x 15 reps  Manual: STM with compression to Rt cervical paraspinals and upper traps; skilled palpation and monitoring of soft tissue during DN Trigger Point Dry-Needling  Treatment instructions: Expect mild to moderate muscle soreness. S/S of pneumothorax if dry needled over a lung field, and to seek immediate medical attention should they occur. Patient verbalized understanding of these instructions and education.   Patient Consent Given: Yes Education handout provided: Yes Muscles treated:  Rt cervical paraspinals, upper trap Electrical stimulation performed: No Parameters: N/A Treatment response/outcome: twitch response, decreased tightness reported   PATIENT EDUCATION:  Education details: HEP Person educated: Patient and Spouse Education method: Explanation, Media planner, and Handouts Education comprehension: verbalized understanding, returned demonstration, and needs further education     HOME EXERCISE PROGRAM: Access Code: [facility] URL: [email or web address] Date: 09/23/2021 Prepared by: Kearney Hard  Exercises - Half Neck Circles  - 2 x daily - 7 x weekly - 1 sets - 10 reps - Standing Backward Shoulder Rolls  - 2 x daily - 7 x weekly - 1 sets - 10 reps - Seated Scapular Retraction  - 2 x daily - 7 x weekly - 1 sets - 10 reps - 5 sec hold - Standing Shoulder Flexion AAROM with Dowel  - 1 x daily - 7 x weekly - 15 reps - Standing Shoulder Abduction AAROM with Dowel  - 1 x daily - 7 x weekly - 15 reps   ASSESSMENT:   CLINICAL IMPRESSION: Pt has met 3/4 LTGs at this time.  Pain still continues to be elevated and complains of continued spasms to muscles.  Discussed possibility of botox to area and pt and wife will reach out to neurologist to determine if he would be a candidate.  At this time will hold PT and pt will call if needed.  OBJECTIVE IMPAIRMENTS decreased ROM, decreased strength, hypomobility, increased fascial restrictions, increased muscle spasms, impaired UE functional use, postural dysfunction, and pain.    ACTIVITY LIMITATIONS community activity, driving, and yard work.    PERSONAL FACTORS Time since onset of injury/illness/exacerbation and 3+ comorbidities: Cervical spondylosis with radiculopathy, PTSD, OA, obesity, RBBB  are also affecting patient's functional outcome.      REHAB POTENTIAL: Good   CLINICAL DECISION MAKING: Evolving/moderate complexity   EVALUATION COMPLEXITY: Low     GOALS: Goals reviewed with patient?  Yes   SHORT TERM GOALS: Target date: 09/23/2021   Independent with initial HEP Goal status: MET     LONG TERM GOALS: Target date: 10/14/2021   Independent with final HEP Goal status: MET 09/30/21   2.  FOTO score improved to 68 Goal status: MET 09/30/21   3.  Rt cervical rotation AROM improved to 40 deg for improved function and mobility Goal status: MET 09/30/21   4.  Report pain < 3/10 with exercise and overhead activity for improved function Goal status: NOT MET (improved, not to goal) 09/30/21     PLAN: PT FREQUENCY: 1x/week   PT DURATION: 6 weeks   PLANNED INTERVENTIONS: Therapeutic exercises, Therapeutic activity, Neuromuscular re-education, Patient/Family education, Joint mobilization, Aquatic Therapy, Dry Needling, Electrical stimulation, Spinal manipulation, Spinal mobilization, Cryotherapy, Moist heat, Taping, Traction, and Manual therapy   PLAN FOR NEXT SESSION:  hold PT x 30 days, d/c or  recert          Laureen Abrahams, PT, DPT 09/30/21 2:27 PM       PHYSICAL THERAPY DISCHARGE SUMMARY  Visits from Start of Care: 5  Current functional level related to goals / functional outcomes: See above   Remaining deficits: See above   Education / Equipment: HEP, DN   Patient agrees to discharge. Patient goals were partially met. Patient is being discharged due to being pleased with the current functional level.   Laureen Abrahams, PT, DPT 11/20/21 8:15 AM  [facility] [address] [place], [place], [address] Phone: [phone]   Fax:  [phone]

## 2021-12-01 ENCOUNTER — Other Ambulatory Visit: Payer: Self-pay | Admitting: Family Medicine

## 2021-12-02 DIAGNOSIS — H25813 Combined forms of age-related cataract, bilateral: Secondary | ICD-10-CM | POA: Diagnosis not present

## 2021-12-02 DIAGNOSIS — H04123 Dry eye syndrome of bilateral lacrimal glands: Secondary | ICD-10-CM | POA: Diagnosis not present

## 2021-12-02 DIAGNOSIS — H02831 Dermatochalasis of right upper eyelid: Secondary | ICD-10-CM | POA: Diagnosis not present

## 2021-12-02 DIAGNOSIS — H5203 Hypermetropia, bilateral: Secondary | ICD-10-CM | POA: Diagnosis not present

## 2022-02-26 DIAGNOSIS — Z23 Encounter for immunization: Secondary | ICD-10-CM | POA: Diagnosis not present

## 2022-03-05 DIAGNOSIS — M10072 Idiopathic gout, left ankle and foot: Secondary | ICD-10-CM | POA: Diagnosis not present

## 2022-03-05 DIAGNOSIS — G8929 Other chronic pain: Secondary | ICD-10-CM | POA: Insufficient documentation

## 2022-03-05 DIAGNOSIS — M7661 Achilles tendinitis, right leg: Secondary | ICD-10-CM | POA: Diagnosis not present

## 2022-03-19 DIAGNOSIS — M7661 Achilles tendinitis, right leg: Secondary | ICD-10-CM | POA: Diagnosis not present

## 2022-03-28 DIAGNOSIS — M7661 Achilles tendinitis, right leg: Secondary | ICD-10-CM | POA: Diagnosis not present

## 2022-03-28 IMAGING — MR MR CERVICAL SPINE W/O CM
4 of 5 series · 29 of 48 positions shown · non-contrast
Comparison: No prior MRI,

CLINICAL DATA: Neck pain

EXAM:
MRI CERVICAL SPINE WITHOUT CONTRAST
TECHNIQUE: Multiplanar, multisequence MR imaging of the cervical spine was
performed. No intravenous contrast was administered.

[Series 2: T2 · sagittal · 3.0mm · 0.66mm/px · 8 of 15 slices shown (1 of 2)]
[im 1/15]
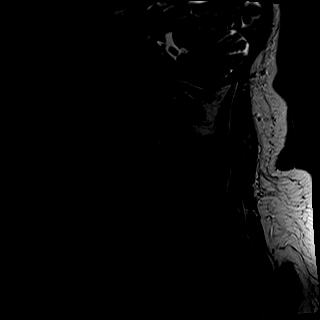
[im 3/15]
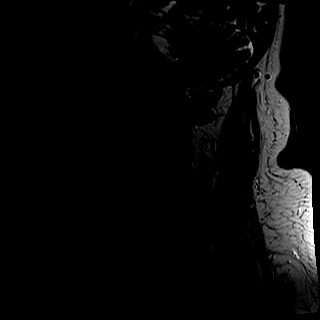
[im 5/15]
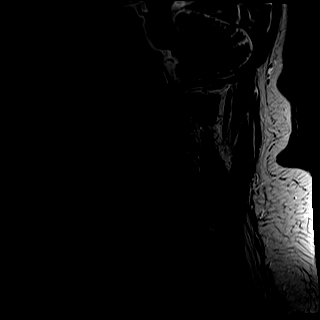
[im 7/15]
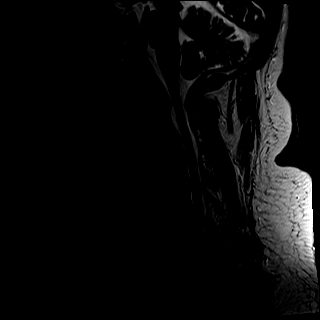
[im 9/15]
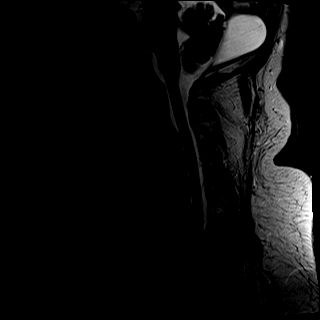
[im 11/15]
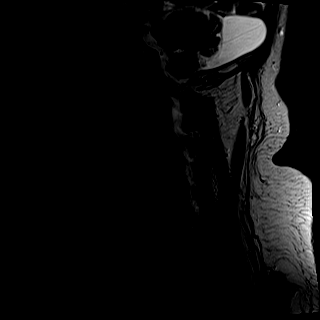
[im 13/15]
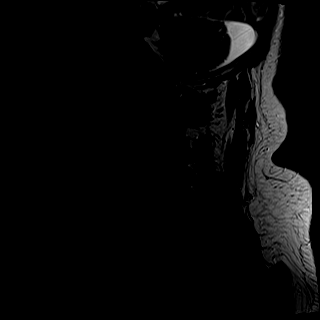
[im 15/15]
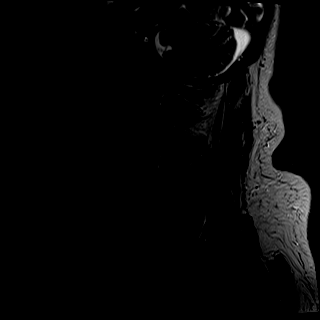

[Series 3: T1 · sagittal · 3.0mm · 0.41mm/px · 7 of 15 slices shown]
[im 1/15]
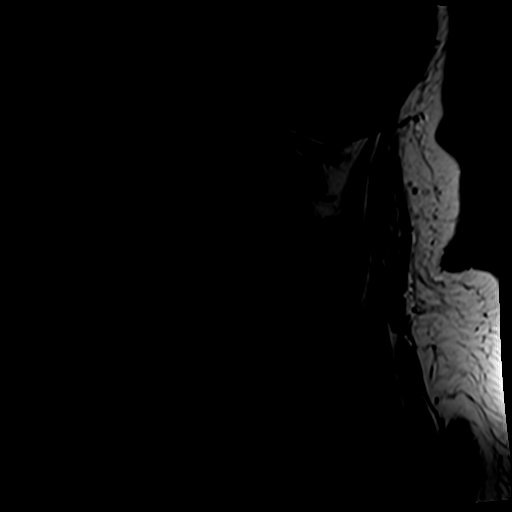
[im 3/15]
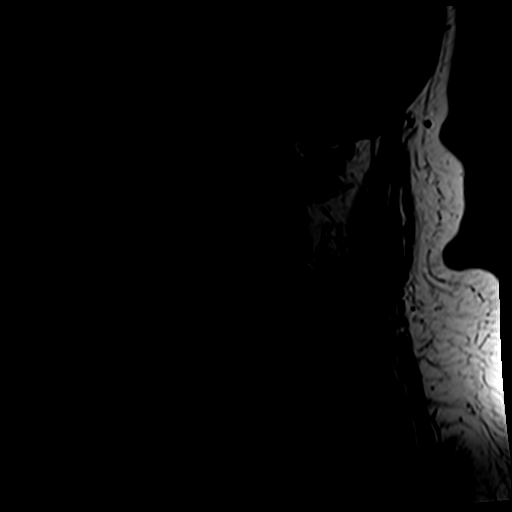
[im 5/15]
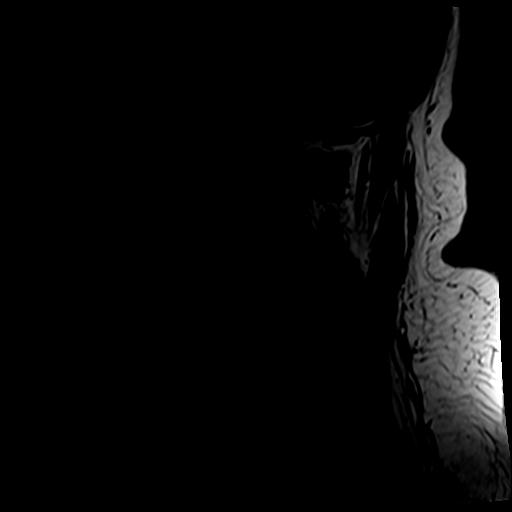
[im 8/15]
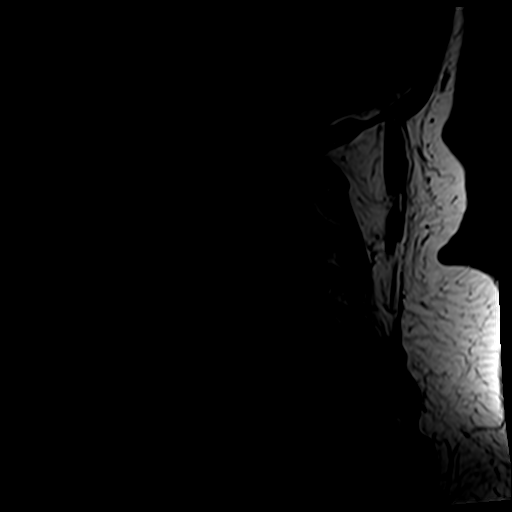
[im 10/15]
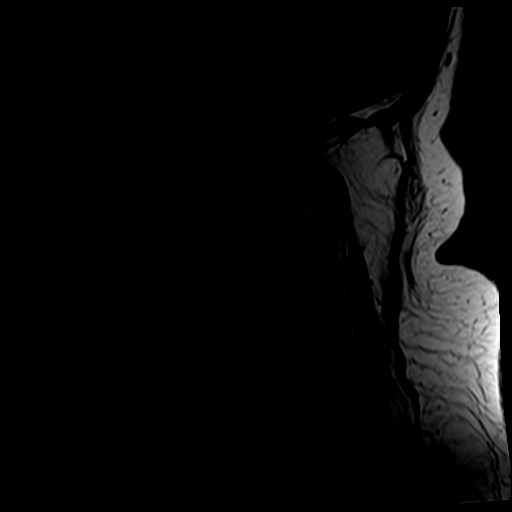
[im 12/15]
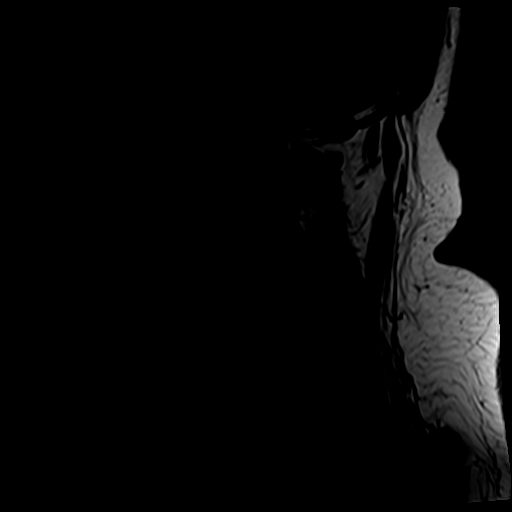
[im 15/15]
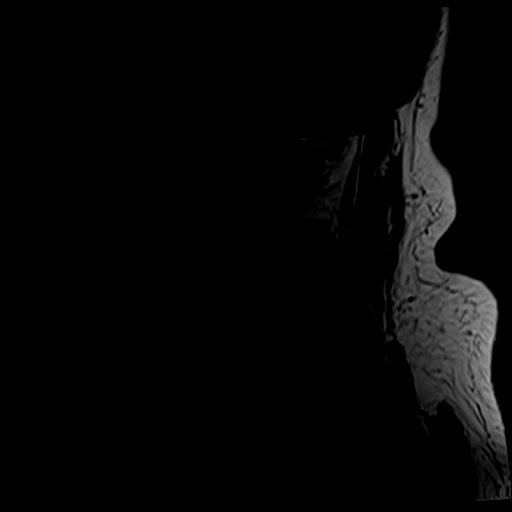

[Series 4: tir sag · sagittal · 3.0mm · 0.41mm/px · 5 of 15 slices shown]
[im 1/15]
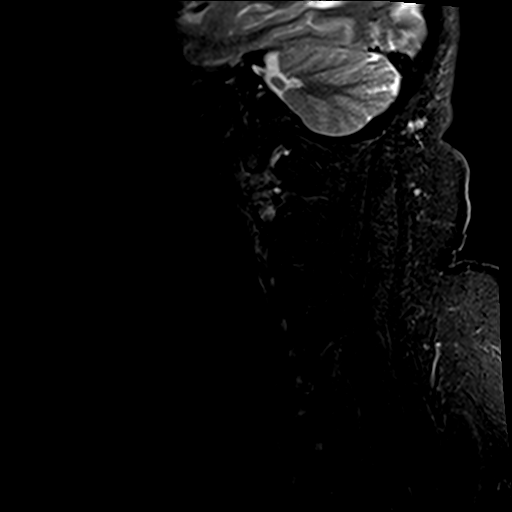
[im 3/15]
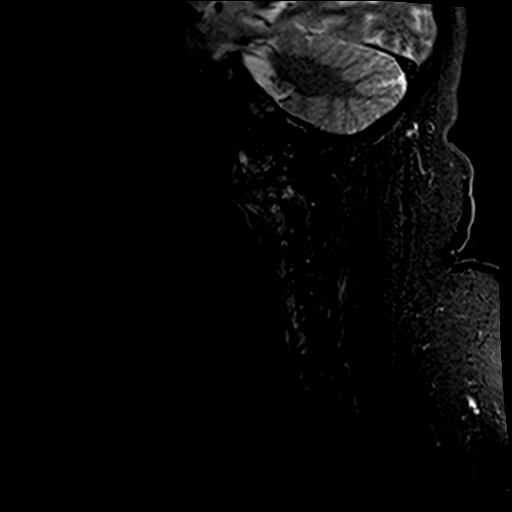
[im 5/15]
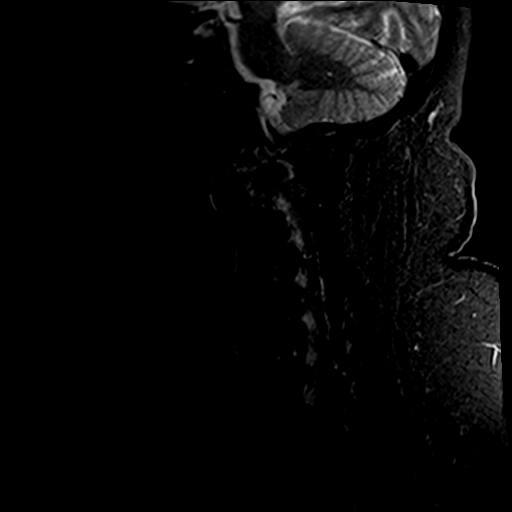
[im 8/15]
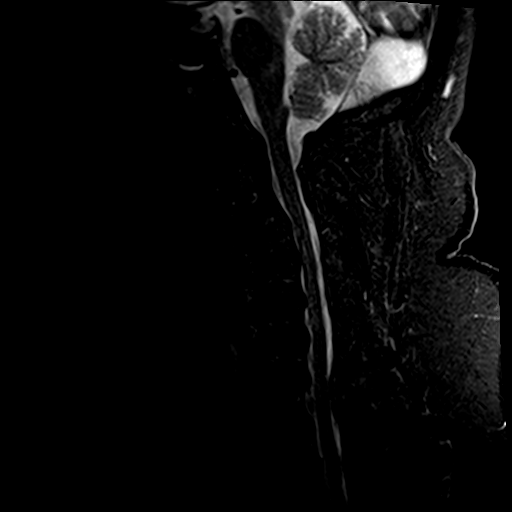
[im 12/15]
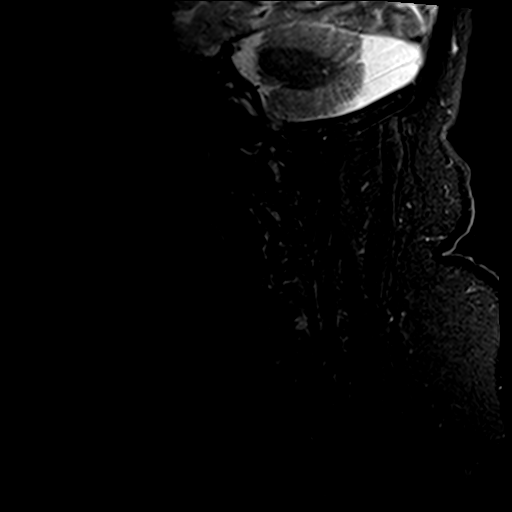

[Series 6: T2 · axial · 3.0mm · 0.70mm/px · z∈[-109,-12]mm · 9 of 27 slices shown (2 of 2)]
[im 1/27]
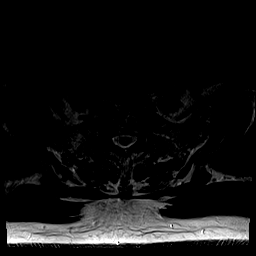
[im 5/27]
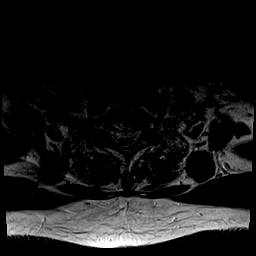
[im 9/27]
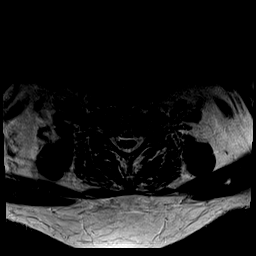
[im 11/27]
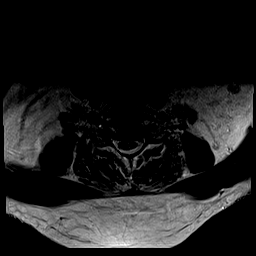
[im 14/27]
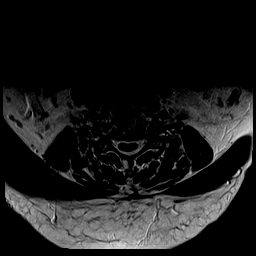
[im 16/27]
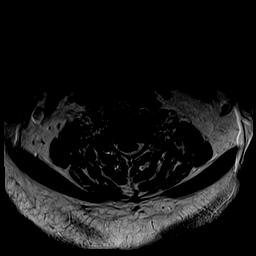
[im 18/27]
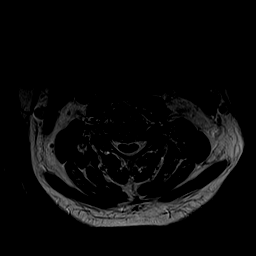
[im 22/27]
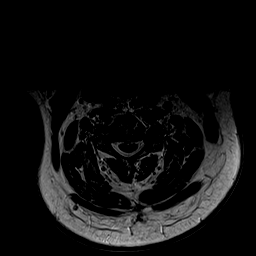
[im 27/27]
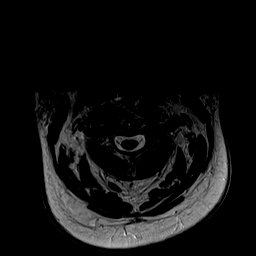

[29 of 48 positions shown; findings below may reference images not displayed]

FINDINGS: Alignment: Reversal of the normal cervical lordosis with trace
retrolisthesis of C6 on C7 and mild anterolisthesis of C7 on T1.

Vertebrae: No acute fracture or suspicious osseous lesion.
Congenitally short pedicles, which narrow the AP diameter of the
spinal canal.

Cord: Normal signal and morphology.

Posterior Fossa, vertebral arteries, paraspinal tissues: Large
posterior fossa arachnoid cyst. Otherwise negative.

Disc levels:

C2-C3: No significant disc bulge. Facet arthropathy. No spinal canal
stenosis or neuroforaminal narrowing.

C3-C4: Small central disc protrusion, which abuts the ventral cord.
No spinal canal stenosis. Uncovertebral and facet arthropathy.
Severe right and moderate to severe left neural foraminal narrowing.

C4-C5: Mild disc bulge. Uncovertebral and facet arthropathy. No
spinal canal stenosis. Severe bilateral neural foraminal narrowing.

C5-C6: Mild disc bulge. Uncovertebral and facet arthropathy. No
spinal canal stenosis. Moderate to severe left and mild right neural
foraminal narrowing.

C6-C7: Trace retrolisthesis with broad-based disc bulge. Mild to
moderate spinal canal stenosis. Uncovertebral and facet arthropathy.
Moderate bilateral neural foraminal narrowing.

C7-T1: Mild anterolisthesis with disc unroofing. Mild spinal canal
stenosis. Uncovertebral and facet arthropathy. Moderate to severe
left and moderate right neural foraminal narrowing.
IMPRESSION: 1. C6-C7 mild-to-moderate spinal canal stenosis with moderate
bilateral neural foraminal narrowing.
2. C7-T1 mild spinal canal stenosis with moderate to severe left and
moderate right neural foraminal narrowing.
3. Multilevel facet and uncovertebral hypertrophy, which causes
severe neural foraminal narrowing on the right at C3-C4 and
bilaterally at C4-C5, with additional less severe neural foraminal
narrowing at multiple levels, as described above.

## 2022-04-01 DIAGNOSIS — M7661 Achilles tendinitis, right leg: Secondary | ICD-10-CM | POA: Diagnosis not present

## 2022-06-02 ENCOUNTER — Ambulatory Visit (INDEPENDENT_AMBULATORY_CARE_PROVIDER_SITE_OTHER): Payer: Medicare HMO | Admitting: Family Medicine

## 2022-06-02 ENCOUNTER — Encounter: Payer: Self-pay | Admitting: Family Medicine

## 2022-06-02 VITALS — BP 142/94 | HR 75 | Temp 98.3°F | Ht 66.5 in | Wt 226.0 lb

## 2022-06-02 DIAGNOSIS — I10 Essential (primary) hypertension: Secondary | ICD-10-CM | POA: Diagnosis not present

## 2022-06-02 DIAGNOSIS — R0989 Other specified symptoms and signs involving the circulatory and respiratory systems: Secondary | ICD-10-CM | POA: Diagnosis not present

## 2022-06-02 DIAGNOSIS — R011 Cardiac murmur, unspecified: Secondary | ICD-10-CM | POA: Diagnosis not present

## 2022-06-02 DIAGNOSIS — R052 Subacute cough: Secondary | ICD-10-CM

## 2022-06-02 MED ORDER — AZITHROMYCIN 250 MG PO TABS: ORAL_TABLET | ORAL | 0 refills | Status: DC

## 2022-06-02 MED ORDER — LOSARTAN POTASSIUM 25 MG PO TABS: 25.0000 mg | ORAL_TABLET | Freq: Every day | ORAL | 6 refills | Status: DC

## 2022-06-02 NOTE — Progress Notes (Signed)
Patient ID: [name], male    DOB: [birth year], 77 y.o.   MRN: [id number]  This visit was conducted in person.  BP (!) 142/94 (BP Location: Left Arm, Cuff Size: Large)   Pulse 75   Temp 98.3 F (36.8 C)   Ht 5' 6.5" (1.689 m)   Wt 226 lb (102.5 kg)   SpO2 95%   BMI 35.93 kg/m    CC: cough, headache Subjective:   HPI: [name] is a 77 y.o. male presenting on 06/02/2022 for Cough (X 2 weeks from the side effect of Bp medication Has stopped the medication now. Here today with wife [name].) and Headache   Receives care at the [place].  Recently seen there, BP elevated last several times >573 systolic. He was started on lisinopril '10mg'$  - after starting he developed bad cough as well as headache so he stopped this medication - took it for 2 weeks, stopped about a week ago.   Since stopping, cough persists. Wakes up at 1am with coughing fit. Taking tylenol PMs to help sleep. + PNDrainage. Now cough is productive of light brown mucous. Head congestion present for the past week.  Home BP readings initially 220-254 systolic, now up to 270-623 systolic.  No vision changes, CP/tightness, leg swelling.  He continues exercising 2 hours M-F.   Denies fevers/chills, ear or tooth pain, ST.  He's been taking coricidin OTC. He's also been using flonase and albuterol inhaler more regularly as well as heating pad to chest.  Notes some wheezing.  No h/o asthma. ?RAD with respiratory infection.      Relevant past medical, surgical, family and social history reviewed and updated as indicated. Interim medical history since our last visit reviewed. Allergies and medications reviewed and updated. Outpatient Medications Prior to Visit  Medication Sig Dispense Refill   acetaminophen (TYLENOL) 500 MG tablet Take 1,000 mg by mouth daily as needed for headache.      albuterol (VENTOLIN HFA) 108 (90 Base) MCG/ACT inhaler INHALE TWO PUFFS INTO THE LUNGS EVERY 6 HOURS AS NEEDED 18 each 3    allopurinol (ZYLOPRIM) 100 MG tablet Take 1 tablet (100 mg total) by mouth daily. 90 tablet 3   Cholecalciferol (VITAMIN D3) 125 MCG (5000 UT) CAPS Take by mouth.     co-enzyme Q-10 50 MG capsule Take 1 capsule (50 mg total) by mouth daily.     colchicine 0.6 MG tablet Take 1 tablet (0.6 mg total) by mouth daily as needed (gout flare). First dose may take 2 tablets (1.'2mg'$ ) 30 tablet 3   fluticasone (FLONASE) 50 MCG/ACT nasal spray USE 2 SPRAYS INTO EACH NOSTRIL ONCE DAILY AS NEEDED FOR CONGESTION 16 mL 9   indomethacin (INDOCIN) 25 MG capsule Take 1 capsule (25 mg total) by mouth 2 (two) times daily as needed (gout flare). 60 capsule 0   lovastatin (MEVACOR) 20 MG tablet Take 1 tablet (20 mg total) by mouth daily at 6 PM. 90 tablet 3   Misc Natural Products (OSTEO BI-FLEX ADV TRIPLE ST PO) Take by mouth.     Naproxen Sodium (ALEVE PO) Take 2 tablets by mouth as needed.     pantoprazole (PROTONIX) 40 MG tablet Take 1 tablet (40 mg total) by mouth daily. 90 tablet 3   QUERCETIN PO Take by mouth.     sertraline (ZOLOFT) 100 MG tablet TAKE ONE-HALF TABLET BY MOUTH TWICE A DAY FOR PTSD     sildenafil (REVATIO) 20 MG tablet TAKE 2-5 TABLETS  DAILY AS NEEDED 45 tablet 1   vitamin C (ASCORBIC ACID) 500 MG tablet Take 500 mg by mouth daily.     Zinc 50 MG TABS Take by mouth.     No facility-administered medications prior to visit.     Per HPI unless specifically indicated in ROS section below Review of Systems  Objective:  BP (!) 142/94 (BP Location: Left Arm, Cuff Size: Large)   Pulse 75   Temp 98.3 F (36.8 C)   Ht 5' 6.5" (1.689 m)   Wt 226 lb (102.5 kg)   SpO2 95%   BMI 35.93 kg/m   Wt Readings from Last 3 Encounters:  06/02/22 226 lb (102.5 kg)  08/09/21 218 lb 4 oz (99 kg)  08/01/21 216 lb (98 kg)      Physical Exam Vitals and nursing note reviewed.  Constitutional:      Appearance: Normal appearance. He is not ill-appearing.  HENT:     Head: Normocephalic and atraumatic.      Right Ear: Hearing, tympanic membrane, ear canal and external ear normal. There is no impacted cerumen.     Left Ear: Hearing, tympanic membrane, ear canal and external ear normal. There is no impacted cerumen.     Nose: Mucosal edema (R sided) and congestion present. No rhinorrhea.     Right Turbinates: Not enlarged or swollen.     Left Turbinates: Not enlarged or swollen.     Mouth/Throat:     Mouth: Mucous membranes are moist.     Pharynx: Oropharynx is clear. No oropharyngeal exudate or posterior oropharyngeal erythema.  Eyes:     Extraocular Movements: Extraocular movements intact.     Conjunctiva/sclera: Conjunctivae normal.     Pupils: Pupils are equal, round, and reactive to light.  Cardiovascular:     Rate and Rhythm: Normal rate and regular rhythm.     Pulses: Normal pulses.     Heart sounds: Normal heart sounds. No murmur heard. Pulmonary:     Effort: Pulmonary effort is normal. No respiratory distress.     Breath sounds: Normal breath sounds. No wheezing, rhonchi or rales.     Comments: Lungs overall clear Musculoskeletal:     Cervical back: Normal range of motion and neck supple. No rigidity.     Right lower leg: No edema.     Left lower leg: No edema.  Lymphadenopathy:     Cervical: No cervical adenopathy.  Skin:    General: Skin is warm and dry.     Findings: No rash.  Neurological:     Mental Status: He is alert.  Psychiatric:        Mood and Affect: Mood normal.        Behavior: Behavior normal.       Lab Results  Component Value Date   HGBA1C 5.7 08/02/2021     Assessment & Plan:   Problem List Items Addressed This Visit     Cough - Primary    Initial dry cough that started after lisinopril commencement - anticipate ACEI side effect. See above. Now with productive cough for the past week despite stopping lisinopril - anticipate has developed respiratory infection. Supportive measures reviewed. Rec start plain mucinex with plenty of water, fluids,  rest. WASP Rx for azithromycin sent to pharmacy with indications when to fill. Pt agrees with plan.  Possible RAD component - managing with albuterol inhaler PRN - continue this.       Systolic murmur    Not appreciated  Right carotid bruit    R upper chest bruit with unequal blood pressures BUE, R arm BP 30 points higher systolically.  No upper extremity claudication symptoms. Check carotid US to evaluate for right subclavian stenosis.       Relevant Orders   [facility] US CAROTID   Elevated blood pressure reading in office with diagnosis of hypertension    [facility] and [facility] readings significantly elevated with discrepancy between R and L arms. Check carotid US. ACEI caused cough. Start ARB losartan '25mg'$  daily.  Continue to monitor BP at home.  DASH Diet handout provided as well as handout on other diet/lifestyle strategies to help control blood pressure.      Relevant Medications   losartan (COZAAR) 25 MG tablet   No orders of the defined types were placed in this encounter.  Meds ordered this encounter  Medications   azithromycin (ZITHROMAX) 250 MG tablet    Sig: Take two tablets on day one followed by one tablet on days 2-5    Dispense:  6 each    Refill:  0   losartan (COZAAR) 25 MG tablet    Sig: Take 1 tablet (25 mg total) by mouth daily.    Dispense:  30 tablet    Refill:  6    To replace lisinopril    Patient instructions: Stop lisinopril. In its place take losartan '25mg'$  daily. This should have less cough side effect.  Take plain mucinex (fast relief guaifenesin) with plenty of water to break up mucous. Push fluids and rest. If not improving with this, may take zpack sent to pharmacy.  Given pressure differences in both arms and extra sound heard on lung auscultation, I'd like to check carotid artery ultrasound.  Your goal blood pressure is <140/90. Work on low salt/sodium diet - goal <1.5gm (1,'500mg'$ ) per day. Eat a diet high in  fruits/vegetables and whole grains. Look into mediterranean and DASH diet.  Goal activity is 154mn/wk of moderate intensity exercise. This can be split into 30 minute chunks.  If you are not at this level, you can start with smaller 10-15 min increments and slowly build up activity. Look at [place] for more resources   Follow up plan: Return if symptoms worsen or fail to improve.  JRia Bush MD

## 2022-06-02 NOTE — Patient Instructions (Addendum)
Stop lisinopril. In its place take losartan '25mg'$  daily. This should have less cough side effect.  Take plain mucinex (fast relief guaifenesin) with plenty of water to break up mucous. Push fluids and rest. If not improving with this, may take zpack sent to pharmacy.  Given pressure differences in both arms and extra sound heard on lung auscultation, I'd like to check carotid artery ultrasound.  Your goal blood pressure is <140/90. Work on low salt/sodium diet - goal <1.5gm (1,'500mg'$ ) per day. Eat a diet high in fruits/vegetables and whole grains. Look into mediterranean and DASH diet.  Goal activity is 137mn/wk of moderate intensity exercise. This can be split into 30 minute chunks.  If you are not at this level, you can start with smaller 10-15 min increments and slowly build up activity. Look at [place] for more resources   DASH Eating Plan DASH stands for Dietary Approaches to Stop Hypertension. The DASH eating plan is a healthy eating plan that has been shown to: Reduce high blood pressure (hypertension). Reduce your risk for type 2 diabetes, heart disease, and stroke. Help with weight loss. What are tips for following this plan? Reading food labels Check food labels for the amount of salt (sodium) per serving. Choose foods with less than 5 percent of the Daily Value of sodium. Generally, foods with less than 300 milligrams (mg) of sodium per serving fit into this eating plan. To find whole grains, look for the word "whole" as the first word in the ingredient list. Shopping Buy products labeled as "low-sodium" or "no salt added." Buy fresh foods. Avoid canned foods and pre-made or frozen meals. Cooking Avoid adding salt when cooking. Use salt-free seasonings or herbs instead of table salt or sea salt. Check with your health care provider or pharmacist before using salt substitutes. Do not fry foods. Cook foods using healthy methods such as baking, boiling, grilling, roasting, and  broiling instead. Cook with heart-healthy oils, such as olive, canola, avocado, soybean, or sunflower oil. Meal planning  Eat a balanced diet that includes: 4 or more servings of fruits and 4 or more servings of vegetables each day. Try to fill one-half of your plate with fruits and vegetables. 6-8 servings of whole grains each day. Less than 6 oz (170 g) of lean meat, poultry, or fish each day. A 3-oz (85-g) serving of meat is about the same size as a deck of cards. One egg equals 1 oz (28 g). 2-3 servings of low-fat dairy each day. One serving is 1 cup (237 mL). 1 serving of nuts, seeds, or beans 5 times each week. 2-3 servings of heart-healthy fats. Healthy fats called omega-3 fatty acids are found in foods such as walnuts, flaxseeds, fortified milks, and eggs. These fats are also found in cold-water fish, such as sardines, salmon, and mackerel. Limit how much you eat of: Canned or prepackaged foods. Food that is high in trans fat, such as some fried foods. Food that is high in saturated fat, such as fatty meat. Desserts and other sweets, sugary drinks, and other foods with added sugar. Full-fat dairy products. Do not salt foods before eating. Do not eat more than 4 egg yolks a week. Try to eat at least 2 vegetarian meals a week. Eat more home-cooked food and less restaurant, buffet, and fast food. Lifestyle When eating at a restaurant, ask that your food be prepared with less salt or no salt, if possible. If you drink alcohol: Limit how much you use to: 0-1 drink  a day for women who are not pregnant. 0-2 drinks a day for men. Be aware of how much alcohol is in your drink. In the [place] one drink equals one 12 oz bottle of beer (355 mL), one 5 oz glass of wine (148 mL), or one 1 oz glass of hard liquor (44 mL). General information Avoid eating more than 2,300 mg of salt a day. If you have hypertension, you may need to reduce your sodium intake to 1,500 mg a day. Work with your health  care provider to maintain a healthy body weight or to lose weight. Ask what an ideal weight is for you. Get at least 30 minutes of exercise that causes your heart to beat faster (aerobic exercise) most days of the week. Activities may include walking, swimming, or biking. Work with your health care provider or dietitian to adjust your eating plan to your individual calorie needs. What foods should I eat? Fruits All fresh, dried, or frozen fruit. Canned fruit in natural juice (without added sugar). Vegetables Fresh or frozen vegetables (raw, steamed, roasted, or grilled). Low-sodium or reduced-sodium tomato and vegetable juice. Low-sodium or reduced-sodium tomato sauce and tomato paste. Low-sodium or reduced-sodium canned vegetables. Grains Whole-grain or whole-wheat bread. Whole-grain or whole-wheat pasta. Brown rice. [place]. Bulgur. Whole-grain and low-sodium cereals. Pita bread. Low-fat, low-sodium crackers. Whole-wheat flour tortillas. Meats and other proteins Skinless chicken or [place]. Ground chicken or [place]. Pork with fat trimmed off. Fish and seafood. Egg whites. Dried beans, peas, or lentils. Unsalted nuts, nut butters, and seeds. Unsalted canned beans. Lean cuts of beef with fat trimmed off. Low-sodium, lean precooked or cured meat, such as sausages or meat loaves. Dairy Low-fat (1%) or fat-free (skim) milk. Reduced-fat, low-fat, or fat-free cheeses. Nonfat, low-sodium ricotta or cottage cheese. Low-fat or nonfat yogurt. Low-fat, low-sodium cheese. Fats and oils Soft margarine without trans fats. Vegetable oil. Reduced-fat, low-fat, or light mayonnaise and salad dressings (reduced-sodium). Canola, safflower, olive, avocado, soybean, and sunflower oils. Avocado. Seasonings and condiments Herbs. Spices. Seasoning mixes without salt. Other foods Unsalted popcorn and pretzels. Fat-free sweets. The items listed above may not be a complete list of foods and beverages you can eat.  Contact a dietitian for more information. What foods should I avoid? Fruits Canned fruit in a light or heavy syrup. Fried fruit. Fruit in cream or butter sauce. Vegetables Creamed or fried vegetables. Vegetables in a cheese sauce. Regular canned vegetables (not low-sodium or reduced-sodium). Regular canned tomato sauce and paste (not low-sodium or reduced-sodium). Regular tomato and vegetable juice (not low-sodium or reduced-sodium). Angie Fava. Olives. Grains Baked goods made with fat, such as croissants, muffins, or some breads. Dry pasta or rice meal packs. Meats and other proteins Fatty cuts of meat. Ribs. Fried meat. Berniece Salines. Bologna, salami, and other precooked or cured meats, such as sausages or meat loaves. Fat from the back of a pig (fatback). Bratwurst. Salted nuts and seeds. Canned beans with added salt. Canned or smoked fish. Whole eggs or egg yolks. Chicken or [place] with skin. Dairy Whole or 2% milk, cream, and half-and-half. Whole or full-fat cream cheese. Whole-fat or sweetened yogurt. Full-fat cheese. Nondairy creamers. Whipped toppings. Processed cheese and cheese spreads. Fats and oils Butter. Stick margarine. Lard. Shortening. Ghee. Bacon fat. Tropical oils, such as coconut, palm kernel, or palm oil. Seasonings and condiments Onion salt, garlic salt, seasoned salt, table salt, and sea salt. Worcestershire sauce. Tartar sauce. Barbecue sauce. Teriyaki sauce. Soy sauce, including reduced-sodium. Steak sauce. Canned and packaged gravies.  Fish sauce. Oyster sauce. Cocktail sauce. Store-bought horseradish. Ketchup. Mustard. Meat flavorings and tenderizers. Bouillon cubes. Hot sauces. Pre-made or packaged marinades. Pre-made or packaged taco seasonings. Relishes. Regular salad dressings. Other foods Salted popcorn and pretzels. The items listed above may not be a complete list of foods and beverages you should avoid. Contact a dietitian for more information. Where to find more  information National Heart, Lung, and Blood Institute: https://wilson-eaton.com/ American Heart Association: www.heart.org Academy of Nutrition and Dietetics: www.eatright.[place]: www.kidney.org Summary The DASH eating plan is a healthy eating plan that has been shown to reduce high blood pressure (hypertension). It may also reduce your risk for type 2 diabetes, heart disease, and stroke. When on the DASH eating plan, aim to eat more fresh fruits and vegetables, whole grains, lean proteins, low-fat dairy, and heart-healthy fats. With the DASH eating plan, you should limit salt (sodium) intake to 2,300 mg a day. If you have hypertension, you may need to reduce your sodium intake to 1,500 mg a day. Work with your health care provider or dietitian to adjust your eating plan to your individual calorie needs. This information is not intended to replace advice given to you by your health care provider. Make sure you discuss any questions you have with your health care provider. Document Revised: 04/15/2019 Document Reviewed: 04/15/2019 Elsevier Patient Education  [place].

## 2022-06-02 NOTE — Assessment & Plan Note (Addendum)
R upper chest bruit with unequal blood pressures BUE, R arm BP 30 points higher systolically.  No upper extremity claudication symptoms. Check carotid US to evaluate for right subclavian stenosis.

## 2022-06-02 NOTE — Assessment & Plan Note (Addendum)
Has been diagnosed by [facility] - and home readings significantly elevated with discrepancy between R and L arms. Check carotid US. ACEI caused cough. Start ARB losartan '25mg'$  daily.  Continue to monitor BP at home.  DASH Diet handout provided as well as handout on other diet/lifestyle strategies to help control blood pressure.

## 2022-06-02 NOTE — Assessment & Plan Note (Addendum)
Initial dry cough that started after lisinopril commencement - anticipate ACEI side effect. See above. Now with productive cough for the past week despite stopping lisinopril - anticipate has developed respiratory infection. Supportive measures reviewed. Rec start plain mucinex with plenty of water, fluids, rest. WASP Rx for azithromycin sent to pharmacy with indications when to fill. Pt agrees with plan.  Possible RAD component - managing with albuterol inhaler PRN - continue this.

## 2022-06-02 NOTE — Assessment & Plan Note (Signed)
Not appreciated 

## 2022-06-04 ENCOUNTER — Telehealth: Payer: Self-pay | Admitting: Family Medicine

## 2022-06-04 MED ORDER — CHERATUSSIN AC 100-10 MG/5ML PO SOLN: 5.0000 mL | Freq: Two times a day (BID) | ORAL | 0 refills | Status: DC | PRN

## 2022-06-04 NOTE — Telephone Encounter (Signed)
Patient wife [name] called in and stated that patient was seen in the office for a cough. She stated that they forgot to mention that they would like for the cough syrup to be called in. Please advise. Thank you!

## 2022-06-04 NOTE — Telephone Encounter (Signed)
Patient notified as instructed by telephone and verbalized understanding. 

## 2022-06-04 NOTE — Addendum Note (Signed)
Addended by: Ria Bush on: 06/04/2022 05:30 PM   Modules accepted: Orders

## 2022-06-04 NOTE — Telephone Encounter (Addendum)
Codeine cough syrup sent to [facility].  Sedation precautions.

## 2022-06-04 NOTE — Telephone Encounter (Signed)
Called and spoke to patient and was advised that he would like [facility].

## 2022-06-12 ENCOUNTER — Telehealth: Payer: Self-pay | Admitting: Family Medicine

## 2022-06-12 NOTE — Telephone Encounter (Signed)
[facility] called over and stated they need an authorization number in order for the patient to have the [place] of Carotid done tomorrow. Thank you!

## 2022-06-12 NOTE — Telephone Encounter (Signed)
Did he take zpack previously prescribed? How did this help symptoms?

## 2022-06-12 NOTE — Telephone Encounter (Signed)
Offered pt an appointment today in office with [place] but they declined.

## 2022-06-12 NOTE — Telephone Encounter (Signed)
Pt's wife, [name], called stating the pt is still having continuous cold symptoms after seeing Dr.G on 06/02/22. Kay scheduled a ov on 06/17/22 for pt to see Dr. Darnell Level again. [name] asked could Dr. Darnell Level possibly prescribe some antibiotics to help pt? Call back # [phone]

## 2022-06-12 NOTE — Telephone Encounter (Signed)
No PA is required [facility]

## 2022-06-13 ENCOUNTER — Ambulatory Visit: Payer: Medicare HMO | Attending: Family Medicine

## 2022-06-13 DIAGNOSIS — R0989 Other specified symptoms and signs involving the circulatory and respiratory systems: Secondary | ICD-10-CM

## 2022-06-13 NOTE — Telephone Encounter (Signed)
Left another message on voicemail for patient to call the office back.

## 2022-06-13 NOTE — Telephone Encounter (Signed)
Spoke [facility] relaying [employer]. Verbalizes understanding and expresses thanks for call back.

## 2022-06-13 NOTE — Telephone Encounter (Signed)
Lvm asking pt to call back. Need to get answers to Dr. Synthia Innocent questions.

## 2022-06-16 NOTE — Telephone Encounter (Signed)
Left message on voicemail for patient to call the office back.  When patient calls back about previous message need to give him test results also.

## 2022-06-17 ENCOUNTER — Ambulatory Visit (INDEPENDENT_AMBULATORY_CARE_PROVIDER_SITE_OTHER)
Admission: RE | Admit: 2022-06-17 | Discharge: 2022-06-17 | Disposition: A | Discharge status: Home / Self Care | Payer: Medicare HMO | Source: Ambulatory Visit | Attending: Family Medicine | Admitting: Family Medicine

## 2022-06-17 ENCOUNTER — Encounter: Payer: Self-pay | Admitting: Family Medicine

## 2022-06-17 ENCOUNTER — Ambulatory Visit (INDEPENDENT_AMBULATORY_CARE_PROVIDER_SITE_OTHER): Payer: Medicare HMO | Admitting: Family Medicine

## 2022-06-17 VITALS — BP 148/82 | HR 74 | Temp 98.0°F | Ht 66.5 in | Wt 223.2 lb

## 2022-06-17 DIAGNOSIS — R052 Subacute cough: Secondary | ICD-10-CM

## 2022-06-17 DIAGNOSIS — R0609 Other forms of dyspnea: Secondary | ICD-10-CM

## 2022-06-17 DIAGNOSIS — J984 Other disorders of lung: Secondary | ICD-10-CM | POA: Diagnosis not present

## 2022-06-17 DIAGNOSIS — I10 Essential (primary) hypertension: Secondary | ICD-10-CM | POA: Diagnosis not present

## 2022-06-17 DIAGNOSIS — D71 Functional disorders of polymorphonuclear neutrophils: Secondary | ICD-10-CM | POA: Diagnosis not present

## 2022-06-17 LAB — CBC WITH DIFFERENTIAL/PLATELET
Basophils Absolute: 0 10*3/uL (ref 0.0–0.1)
Basophils Relative: 0.4 % (ref 0.0–3.0)
Eosinophils Absolute: 0.3 10*3/uL (ref 0.0–0.7)
Eosinophils Relative: 4.5 % (ref 0.0–5.0)
HCT: 42 % (ref 39.0–52.0)
Hemoglobin: 14.4 g/dL (ref 13.0–17.0)
Lymphocytes Relative: 28.6 % (ref 12.0–46.0)
Lymphs Abs: 1.9 10*3/uL (ref 0.7–4.0)
MCHC: 34.4 g/dL (ref 30.0–36.0)
MCV: 89.2 fl (ref 78.0–100.0)
Monocytes Absolute: 0.6 10*3/uL (ref 0.1–1.0)
Monocytes Relative: 8.7 % (ref 3.0–12.0)
Neutro Abs: 3.9 10*3/uL (ref 1.4–7.7)
Neutrophils Relative %: 57.8 % (ref 43.0–77.0)
Platelets: 327 10*3/uL (ref 150.0–400.0)
RBC: 4.7 Mil/uL (ref 4.22–5.81)
RDW: 13.1 % (ref 11.5–15.5)
WBC: 6.8 10*3/uL (ref 4.0–10.5)

## 2022-06-17 LAB — BASIC METABOLIC PANEL
BUN: 18 mg/dL (ref 6–23)
CO2: 28 mEq/L (ref 19–32)
Calcium: 9.5 mg/dL (ref 8.4–10.5)
Chloride: 102 mEq/L (ref 96–112)
Creatinine, Ser: 1.13 mg/dL (ref 0.40–1.50)
GFR: 62.6 mL/min (ref >60.00)
Glucose, Bld: 91 mg/dL (ref 70–99)
Potassium: 3.9 mEq/L (ref 3.5–5.1)
Sodium: 140 mEq/L (ref 135–145)

## 2022-06-17 LAB — BRAIN NATRIURETIC PEPTIDE: Pro B Natriuretic peptide (BNP): 66 pg/mL (ref 0.0–100.0)

## 2022-06-17 MED ORDER — LOSARTAN POTASSIUM 50 MG PO TABS: 50.0000 mg | ORAL_TABLET | Freq: Every day | ORAL | 4 refills | Status: DC

## 2022-06-17 MED ORDER — AMOXICILLIN-POT CLAVULANATE 875-125 MG PO TABS: 1.0000 | ORAL_TABLET | Freq: Two times a day (BID) | ORAL | 0 refills | Status: AC

## 2022-06-17 NOTE — Progress Notes (Signed)
Patient ID: [name], male    DOB: 08/02/1945, 77 y.o.   MRN: [id number]  This visit was conducted in person.  BP (!) 148/82   Pulse 74   Temp 98 F (36.7 C) (Temporal)   Ht 5' 6.5" (1.689 m)   Wt 223 lb 4 oz (101.3 kg)   SpO2 94%   BMI 35.49 kg/m    CC: f/u cough  Subjective:   HPI: [name] is a 77 y.o. male presenting on 06/17/2022 for Cough (C/o ongoing cough- worse when lying down and chest congestion. Pt accompanied by wife, [name]. )   Seen 06/02/2022 for several weeks of cough in setting of recently stopping ACEI starting losartan, treated for possible bronchitis with azithromycin and cheratussin cough syrup. No improvement with this regimen.   Notes ongoing cough, blowing his nose, worsening head congestion, affecting sleep. Having coughing fits, notes wheezing as well when laying down. Does feel better when he blows his nose. Worsening cough when he lays down.   No chest pain, leg swelling, orthopnea, PNdyspnea.  No h/o asthma. ?RAD with respiratory infections.   Working on weight - decreased caffeine, salt.      Relevant past medical, surgical, family and social history reviewed and updated as indicated. Interim medical history since our last visit reviewed. Allergies and medications reviewed and updated. Outpatient Medications Prior to Visit  Medication Sig Dispense Refill  . acetaminophen (TYLENOL) 500 MG tablet Take 1,000 mg by mouth daily as needed for headache.     . albuterol (VENTOLIN HFA) 108 (90 Base) MCG/ACT inhaler INHALE TWO PUFFS INTO THE LUNGS EVERY 6 HOURS AS NEEDED 18 each 3  . allopurinol (ZYLOPRIM) 100 MG tablet Take 1 tablet (100 mg total) by mouth daily. 90 tablet 3  . Cholecalciferol (VITAMIN D3) 125 MCG (5000 UT) CAPS Take by mouth.    . co-enzyme Q-10 50 MG capsule Take 1 capsule (50 mg total) by mouth daily.    . colchicine 0.6 MG tablet Take 1 tablet (0.6 mg total) by mouth daily as needed (gout flare). First dose may take 2  tablets (1.'2mg'$ ) 30 tablet 3  . fluticasone (FLONASE) 50 MCG/ACT nasal spray USE 2 SPRAYS INTO EACH NOSTRIL ONCE DAILY AS NEEDED FOR CONGESTION 16 mL 9  . guaiFENesin-codeine (CHERATUSSIN AC) 100-10 MG/5ML syrup Take 5 mLs by mouth 2 (two) times daily as needed for cough (sedation precautions). 120 mL 0  . indomethacin (INDOCIN) 25 MG capsule Take 1 capsule (25 mg total) by mouth 2 (two) times daily as needed (gout flare). 60 capsule 0  . lovastatin (MEVACOR) 20 MG tablet Take 1 tablet (20 mg total) by mouth daily at 6 PM. 90 tablet 3  . Misc Natural Products (OSTEO BI-FLEX ADV TRIPLE ST PO) Take by mouth.    . Naproxen Sodium (ALEVE PO) Take 2 tablets by mouth as needed.    . pantoprazole (PROTONIX) 40 MG tablet Take 1 tablet (40 mg total) by mouth daily. 90 tablet 3  . QUERCETIN PO Take by mouth.    . sertraline (ZOLOFT) 100 MG tablet TAKE ONE-HALF TABLET BY MOUTH TWICE A DAY FOR PTSD    . sildenafil (REVATIO) 20 MG tablet TAKE 2-5 TABLETS DAILY AS NEEDED 45 tablet 1  . vitamin C (ASCORBIC ACID) 500 MG tablet Take 500 mg by mouth daily.    . Zinc 50 MG TABS Take by mouth.    . losartan (COZAAR) 25 MG tablet Take 1 tablet (25  mg total) by mouth daily. 30 tablet 6  . azithromycin (ZITHROMAX) 250 MG tablet Take two tablets on day one followed by one tablet on days 2-5 6 each 0   No facility-administered medications prior to visit.     Per HPI unless specifically indicated in ROS section below Review of Systems  Objective:  BP (!) 148/82   Pulse 74   Temp 98 F (36.7 C) (Temporal)   Ht 5' 6.5" (1.689 m)   Wt 223 lb 4 oz (101.3 kg)   SpO2 94%   BMI 35.49 kg/m   Wt Readings from Last 3 Encounters:  06/17/22 223 lb 4 oz (101.3 kg)  06/02/22 226 lb (102.5 kg)  08/09/21 218 lb 4 oz (99 kg)      Physical Exam Vitals and nursing note reviewed.  Constitutional:      Appearance: Normal appearance. He is not ill-appearing.  HENT:     Head: Normocephalic and atraumatic.     Right Ear:  Hearing normal.     Left Ear: Hearing normal.     Nose: Mucosal edema present. No congestion or rhinorrhea.     Right Turbinates: Not enlarged, swollen or pale.     Left Turbinates: Not enlarged, swollen or pale.     Right Sinus: No maxillary sinus tenderness or frontal sinus tenderness.     Left Sinus: No maxillary sinus tenderness or frontal sinus tenderness.     Mouth/Throat:     Mouth: Mucous membranes are moist.     Pharynx: Oropharynx is clear. No oropharyngeal exudate or posterior oropharyngeal erythema.  Eyes:     Extraocular Movements: Extraocular movements intact.     Conjunctiva/sclera: Conjunctivae normal.     Pupils: Pupils are equal, round, and reactive to light.  Neck:     Vascular: No JVD.  Cardiovascular:     Rate and Rhythm: Normal rate and regular rhythm.     Pulses: Normal pulses.     Heart sounds: Normal heart sounds. No murmur heard. Pulmonary:     Effort: Pulmonary effort is normal. No respiratory distress.     Breath sounds: No wheezing, rhonchi or rales.     Comments: Coarse without significant wheezing Abdominal:     General: Abdomen is protuberant.  Musculoskeletal:     Cervical back: Normal range of motion and neck supple. No rigidity.     Right lower leg: No edema.     Left lower leg: No edema.  Lymphadenopathy:     Cervical: No cervical adenopathy.  Skin:    General: Skin is warm and dry.     Findings: No rash.  Neurological:     Mental Status: He is alert.  Psychiatric:        Mood and Affect: Mood normal.        Behavior: Behavior normal.      Results for orders placed or performed in visit on 06/17/22  CBC with Differential/Platelet  Result Value Ref Range   WBC 6.8 4.0 - 10.5 K/uL   RBC 4.70 4.22 - 5.81 Mil/uL   Hemoglobin 14.4 13.0 - 17.0 g/dL   HCT 42.0 39.0 - 52.0 %   MCV 89.2 78.0 - 100.0 fl   MCHC 34.4 30.0 - 36.0 g/dL   RDW 13.1 11.5 - 15.5 %   Platelets 327.0 150.0 - 400.0 K/uL   Neutrophils Relative % 57.8 43.0 - 77.0 %    Lymphocytes Relative 28.6 12.0 - 46.0 %   Monocytes Relative 8.7 3.0 -  12.0 %   Eosinophils Relative 4.5 0.0 - 5.0 %   Basophils Relative 0.4 0.0 - 3.0 %   Neutro Abs 3.9 1.4 - 7.7 K/uL   Lymphs Abs 1.9 0.7 - 4.0 K/uL   Monocytes Absolute 0.6 0.1 - 1.0 K/uL   Eosinophils Absolute 0.3 0.0 - 0.7 K/uL   Basophils Absolute 0.0 0.0 - 0.1 K/uL  Basic metabolic panel  Result Value Ref Range   Sodium 140 135 - 145 mEq/L   Potassium 3.9 3.5 - 5.1 mEq/L   Chloride 102 96 - 112 mEq/L   CO2 28 19 - 32 mEq/L   Glucose, Bld 91 70 - 99 mg/dL   BUN 18 6 - 23 mg/dL   Creatinine, Ser 1.13 0.40 - 1.50 mg/dL   GFR 62.60 >60.00 mL/min   Calcium 9.5 8.4 - 10.5 mg/dL  Brain natriuretic peptide  Result Value Ref Range   Pro B Natriuretic peptide (BNP) 66.0 0.0 - 100.0 pg/mL    Assessment & Plan:   Problem List Items Addressed This Visit     Exertional dyspnea   Relevant Orders   CBC with Differential/Platelet (Completed)   Basic metabolic panel (Completed)   Brain natriuretic peptide (Completed)   Cough - Primary   Relevant Orders   DG Chest 2 View     Meds ordered this encounter  Medications  . amoxicillin-clavulanate (AUGMENTIN) 875-125 MG tablet    Sig: Take 1 tablet by mouth 2 (two) times daily for 10 days.    Dispense:  20 tablet    Refill:  0  . losartan (COZAAR) 50 MG tablet    Sig: Take 1 tablet (50 mg total) by mouth daily.    Dispense:  30 tablet    Refill:  4    Note new dose    Orders Placed This Encounter  Procedures  . DG Chest 2 View    Standing Status:   Future    Number of Occurrences:   1    Standing Expiration Date:   06/18/2023    Order Specific Question:   Reason for Exam (SYMPTOM  OR DIAGNOSIS REQUIRED)    Answer:   cough x 1 month, eval CHF    Order Specific Question:   Preferred imaging location?    Answer:   [facility]  . CBC with Differential/Platelet  . Basic metabolic panel  . Brain natriuretic peptide    Patient Instructions  Xray  today Labs today Take augmentin antibiotic for possible sinusitis component (sent to [facility]).  We will be in touch with results Increase losartan to '50mg'$  daily - double up on current '25mg'$  dose until you run out. New '50mg'$  tablets sent to pharmacy.  Good to see you today.   Follow up plan: Return if symptoms worsen or fail to improve.  Ria Bush, MD

## 2022-06-17 NOTE — Assessment & Plan Note (Signed)
Ongoing cough present for the past month, started while on ACEI. Seen 2 wks ago treated for respiratory infection with azithromycin and codeine cough syrup without significant improvement.  Now notes progressive head congestion suspicious for possible sinusitis - Rx augmentin course.  Will also further evaluate for CHF as possible cause - check CXR, labs including BNP. Consider lasix course.

## 2022-06-17 NOTE — Patient Instructions (Addendum)
Xray today Labs today Take augmentin antibiotic for possible sinusitis component (sent to [facility]).  We will be in touch with results Increase losartan to '50mg'$  daily - double up on current '25mg'$  dose until you run out. New '50mg'$  tablets sent to pharmacy.  Good to see you today.

## 2022-06-17 NOTE — Telephone Encounter (Signed)
Pt seen in office today. Also, pt made aware of [place] results.

## 2022-06-18 ENCOUNTER — Other Ambulatory Visit: Payer: Self-pay | Admitting: Family Medicine

## 2022-06-18 MED ORDER — FUROSEMIDE 20 MG PO TABS: 10.0000 mg | ORAL_TABLET | Freq: Every day | ORAL | 0 refills | Status: DC | PRN

## 2022-06-18 NOTE — Assessment & Plan Note (Signed)
Progressive, with mildly decreased o2 sat on RA.  Check CXR and labs today to further evaluate for possible CHF component.

## 2022-06-18 NOTE — Assessment & Plan Note (Signed)
Chronic, mildly elevated above goal - increase losartan to '50mg'$  daily.

## 2022-07-02 ENCOUNTER — Other Ambulatory Visit: Payer: Self-pay | Admitting: Family Medicine

## 2022-07-02 DIAGNOSIS — E7800 Pure hypercholesterolemia, unspecified: Secondary | ICD-10-CM

## 2022-07-02 DIAGNOSIS — M1A0790 Idiopathic chronic gout, unspecified ankle and foot, without tophus (tophi): Secondary | ICD-10-CM

## 2022-07-19 ENCOUNTER — Other Ambulatory Visit: Payer: Self-pay | Admitting: Family Medicine

## 2022-07-21 NOTE — Telephone Encounter (Signed)
Refill request Sildenafil Last refill 12/03/21 #45/1 Last office visit 06/17/22

## 2022-07-22 ENCOUNTER — Telehealth: Payer: Self-pay | Admitting: Family Medicine

## 2022-07-22 MED ORDER — SILDENAFIL CITRATE 20 MG PO TABS: ORAL_TABLET | ORAL | 1 refills | Status: DC

## 2022-07-22 NOTE — Telephone Encounter (Signed)
Contacted [name] to schedule their annual wellness visit. Appointment made for 08/05/2022.  [place] Direct Dial: [phone]

## 2022-07-22 NOTE — Telephone Encounter (Signed)
Refill left on vm at pharmacy.  

## 2022-08-02 ENCOUNTER — Other Ambulatory Visit: Payer: Self-pay | Admitting: Family Medicine

## 2022-08-02 DIAGNOSIS — M1A0790 Idiopathic chronic gout, unspecified ankle and foot, without tophus (tophi): Secondary | ICD-10-CM

## 2022-08-02 DIAGNOSIS — Z125 Encounter for screening for malignant neoplasm of prostate: Secondary | ICD-10-CM

## 2022-08-02 DIAGNOSIS — R7301 Impaired fasting glucose: Secondary | ICD-10-CM

## 2022-08-02 DIAGNOSIS — R0609 Other forms of dyspnea: Secondary | ICD-10-CM

## 2022-08-02 DIAGNOSIS — E7800 Pure hypercholesterolemia, unspecified: Secondary | ICD-10-CM

## 2022-08-02 DIAGNOSIS — R69 Illness, unspecified: Secondary | ICD-10-CM | POA: Diagnosis not present

## 2022-08-02 NOTE — Addendum Note (Signed)
Addended by: Ria Bush on: 08/02/2022 10:43 AM   Modules accepted: Orders

## 2022-08-04 ENCOUNTER — Telehealth: Payer: Self-pay | Admitting: Family Medicine

## 2022-08-04 NOTE — Telephone Encounter (Signed)
Contacted [name] to schedule their annual wellness visit. Appointment made for 08/05/2022.Changed appointment form an in-person appt to a phone [place] Direct Dial: [phone]

## 2022-08-05 ENCOUNTER — Other Ambulatory Visit (INDEPENDENT_AMBULATORY_CARE_PROVIDER_SITE_OTHER): Payer: Medicare HMO

## 2022-08-05 ENCOUNTER — Ambulatory Visit (INDEPENDENT_AMBULATORY_CARE_PROVIDER_SITE_OTHER): Payer: Medicare HMO

## 2022-08-05 VITALS — Ht 66.5 in | Wt 217.0 lb

## 2022-08-05 DIAGNOSIS — Z0000 Encounter for general adult medical examination without abnormal findings: Secondary | ICD-10-CM

## 2022-08-05 DIAGNOSIS — E7800 Pure hypercholesterolemia, unspecified: Secondary | ICD-10-CM

## 2022-08-05 DIAGNOSIS — M1A0790 Idiopathic chronic gout, unspecified ankle and foot, without tophus (tophi): Secondary | ICD-10-CM | POA: Diagnosis not present

## 2022-08-05 DIAGNOSIS — R7301 Impaired fasting glucose: Secondary | ICD-10-CM

## 2022-08-05 LAB — COMPREHENSIVE METABOLIC PANEL
ALT: 33 U/L (ref 0–53)
AST: 28 U/L (ref 0–37)
Albumin: 4.2 g/dL (ref 3.5–5.2)
Alkaline Phosphatase: 96 U/L (ref 39–117)
BUN: 14 mg/dL (ref 6–23)
CO2: 29 mEq/L (ref 19–32)
Calcium: 9.7 mg/dL (ref 8.4–10.5)
Chloride: 102 mEq/L (ref 96–112)
Creatinine, Ser: 0.95 mg/dL (ref 0.40–1.50)
GFR: 77.01 mL/min (ref >60.00)
Glucose, Bld: 89 mg/dL (ref 70–99)
Potassium: 4 mEq/L (ref 3.5–5.1)
Sodium: 141 mEq/L (ref 135–145)
Total Bilirubin: 0.7 mg/dL (ref 0.2–1.2)
Total Protein: 7 g/dL (ref 6.0–8.3)

## 2022-08-05 LAB — HEMOGLOBIN A1C: Hgb A1c MFr Bld: 6 % (ref 4.6–6.5)

## 2022-08-05 LAB — LIPID PANEL
Cholesterol: 169 mg/dL (ref 0–200)
HDL: 42.9 mg/dL (ref >39.00)
LDL Cholesterol: 106 mg/dL — ABNORMAL HIGH (ref 0–99)
NonHDL: 126.33
Total CHOL/HDL Ratio: 4
Triglycerides: 102 mg/dL (ref 0.0–149.0)
VLDL: 20.4 mg/dL (ref 0.0–40.0)

## 2022-08-05 LAB — URIC ACID: Uric Acid, Serum: 6 mg/dL (ref 4.0–7.8)

## 2022-08-05 LAB — TSH: TSH: 3.06 u[IU]/mL (ref 0.35–5.50)

## 2022-08-05 NOTE — Patient Instructions (Signed)
[name] , Thank you for taking time to come for your Medicare Wellness Visit. I appreciate your ongoing commitment to your health goals. Please review the following plan we discussed and let me know if I can assist you in the future.   These are the goals we discussed:  Goals      Increase physical activity     Starting 07/23/2018, I will continue to exercise for 90 minutes 6 days per week.      Patient Stated     07/27/2019, I will continue to go to my exercise class 5 days a week for 45 minutes.      Patient Stated     07/31/2020, I will continue to go to the [facility] 5 days a week and exercise for 45 minutes.      Patient Stated     Would like to maintain current routine     Patient Stated     Lose 20 lbs.        This is a list of the screening recommended for you and due dates:  Health Maintenance  Topic Date Due   COVID-19 Vaccine (1) Never done   Medicare Annual Wellness Visit  08/05/2023   DTaP/Tdap/Td vaccine (3 - Td or Tdap) 10/15/2031   Pneumonia Vaccine  Completed   Flu Shot  Completed   Hepatitis C Screening: USPSTF Recommendation to screen - Ages 18-79 yo.  Completed   Zoster (Shingles) Vaccine  Completed   HPV Vaccine  Aged Out   Cologuard (Stool DNA test)  Discontinued    Advanced directives: copy is in your chart.  Conditions/risks identified: none  Next appointment: Follow up in one year for your annual wellness visit. 08/10/2023 @ 8:15 via telephone.  Preventive Care 14 Years and Older, Male  Preventive care refers to lifestyle choices and visits with your health care provider that can promote health and wellness. What does preventive care include? A yearly physical exam. This is also called an annual well check. Dental exams once or twice a year. Routine eye exams. Ask your health care provider how often you should have your eyes checked. Personal lifestyle choices, including: Daily care of your teeth and gums. Regular physical activity. Eating a  healthy diet. Avoiding tobacco and drug use. Limiting alcohol use. Practicing safe sex. Taking low doses of aspirin every day. Taking vitamin and mineral supplements as recommended by your health care provider. What happens during an annual well check? The services and screenings done by your health care provider during your annual well check will depend on your age, overall health, lifestyle risk factors, and family history of disease. Counseling  Your health care provider may ask you questions about your: Alcohol use. Tobacco use. Drug use. Emotional well-being. Home and relationship well-being. Sexual activity. Eating habits. History of falls. Memory and ability to understand (cognition). Work and work Statistician. Screening  You may have the following tests or measurements: Height, weight, and BMI. Blood pressure. Lipid and cholesterol levels. These may be checked every 5 years, or more frequently if you are over 56 years old. Skin check. Lung cancer screening. You may have this screening every year starting at age 31 if you have a 30-pack-year history of smoking and currently smoke or have quit within the past 15 years. Fecal occult blood test (FOBT) of the stool. You may have this test every year starting at age 23. Flexible sigmoidoscopy or colonoscopy. You may have a sigmoidoscopy every 5 years or a colonoscopy  every 10 years starting at age 69. Prostate cancer screening. Recommendations will vary depending on your family history and other risks. Hepatitis C blood test. Hepatitis B blood test. Sexually transmitted disease (STD) testing. Diabetes screening. This is done by checking your blood sugar (glucose) after you have not eaten for a while (fasting). You may have this done every 1-3 years. Abdominal aortic aneurysm (AAA) screening. You may need this if you are a current or former smoker. Osteoporosis. You may be screened starting at age 43 if you are at high risk. Talk  with your health care provider about your test results, treatment options, and if necessary, the need for more tests. Vaccines  Your health care provider may recommend certain vaccines, such as: Influenza vaccine. This is recommended every year. Tetanus, diphtheria, and acellular pertussis (Tdap, Td) vaccine. You may need a Td booster every 10 years. Zoster vaccine. You may need this after age 69. Pneumococcal 13-valent conjugate (PCV13) vaccine. One dose is recommended after age 32. Pneumococcal polysaccharide (PPSV23) vaccine. One dose is recommended after age 79. Talk to your health care provider about which screenings and vaccines you need and how often you need them. This information is not intended to replace advice given to you by your health care provider. Make sure you discuss any questions you have with your health care provider. Document Released: 06/08/2015 Document Revised: 01/30/2016 Document Reviewed: 03/13/2015 Elsevier Interactive Patient Education  2017 [place] Prevention in the Home Falls can cause injuries. They can happen to people of all ages. There are many things you can do to make your home safe and to help prevent falls. What can I do on the outside of my home? Regularly fix the edges of walkways and driveways and fix any cracks. Remove anything that might make you trip as you walk through a door, such as a raised step or threshold. Trim any bushes or trees on the path to your home. Use bright outdoor lighting. Clear any walking paths of anything that might make someone trip, such as rocks or tools. Regularly check to see if handrails are loose or broken. Make sure that both sides of any steps have handrails. Any raised decks and porches should have guardrails on the edges. Have any leaves, snow, or ice cleared regularly. Use sand or salt on walking paths during winter. Clean up any spills in your garage right away. This includes oil or grease  spills. What can I do in the bathroom? Use night lights. Install grab bars by the toilet and in the tub and shower. Do not use towel bars as grab bars. Use non-skid mats or decals in the tub or shower. If you need to sit down in the shower, use a plastic, non-slip stool. Keep the floor dry. Clean up any water that spills on the floor as soon as it happens. Remove soap buildup in the tub or shower regularly. Attach bath mats securely with double-sided non-slip rug tape. Do not have throw rugs and other things on the floor that can make you trip. What can I do in the bedroom? Use night lights. Make sure that you have a light by your bed that is easy to reach. Do not use any sheets or blankets that are too big for your bed. They should not hang down onto the floor. Have a firm chair that has side arms. You can use this for support while you get dressed. Do not have throw rugs and other things on  the floor that can make you trip. What can I do in the kitchen? Clean up any spills right away. Avoid walking on wet floors. Keep items that you use a lot in easy-to-reach places. If you need to reach something above you, use a strong step stool that has a grab bar. Keep electrical cords out of the way. Do not use floor polish or wax that makes floors slippery. If you must use wax, use non-skid floor wax. Do not have throw rugs and other things on the floor that can make you trip. What can I do with my stairs? Do not leave any items on the stairs. Make sure that there are handrails on both sides of the stairs and use them. Fix handrails that are broken or loose. Make sure that handrails are as long as the stairways. Check any carpeting to make sure that it is firmly attached to the stairs. Fix any carpet that is loose or worn. Avoid having throw rugs at the top or bottom of the stairs. If you do have throw rugs, attach them to the floor with carpet tape. Make sure that you have a light switch at the  top of the stairs and the bottom of the stairs. If you do not have them, ask someone to add them for you. What else can I do to help prevent falls? Wear shoes that: Do not have high heels. Have rubber bottoms. Are comfortable and fit you well. Are closed at the toe. Do not wear sandals. If you use a stepladder: Make sure that it is fully opened. Do not climb a closed stepladder. Make sure that both sides of the stepladder are locked into place. Ask someone to hold it for you, if possible. Clearly mark and make sure that you can see: Any grab bars or handrails. First and last steps. Where the edge of each step is. Use tools that help you move around (mobility aids) if they are needed. These include: Canes. Walkers. Scooters. Crutches. Turn on the lights when you go into a dark area. Replace any light bulbs as soon as they burn out. Set up your furniture so you have a clear path. Avoid moving your furniture around. If any of your floors are uneven, fix them. If there are any pets around you, be aware of where they are. Review your medicines with your doctor. Some medicines can make you feel dizzy. This can increase your chance of falling. Ask your doctor what other things that you can do to help prevent falls. This information is not intended to replace advice given to you by your health care provider. Make sure you discuss any questions you have with your health care provider. Document Released: 03/08/2009 Document Revised: 10/18/2015 Document Reviewed: 06/16/2014 Elsevier Interactive Patient Education  2017 [employer].

## 2022-08-05 NOTE — Progress Notes (Signed)
I connected with  [name] on 08/05/22 by a audio enabled telemedicine application and verified that I am speaking with the correct person using two identifiers.  Patient Location: Home  Provider Location: Home Office  I discussed the limitations of evaluation and management by telemedicine. The patient expressed understanding and agreed to proceed.  Subjective:   RAWN LEVERETTE is a 77 y.o. male who presents for Medicare Annual/Subsequent preventive examination.  Review of Systems      Cardiac Risk Factors include: advanced age (>67mn, >>34women);hypertension;male gender     Objective:    Today's Vitals   08/05/22 0754 08/05/22 0755  Weight: 217 lb (98.4 kg)   Height: 5' 6.5" (1.689 m)   PainSc:  4    Body mass index is 34.5 kg/m.     08/05/2022    8:14 AM 09/02/2021    2:34 PM 08/01/2021   11:24 AM 07/31/2020   11:21 AM 07/27/2019    9:50 AM 07/23/2018    8:48 AM 07/17/2017    8:45 AM  Advanced Directives  Does Patient Have a Medical Advance Directive? Yes Yes Yes Yes Yes Yes Yes  Type of AParamedicof [place] will [place]ast [place]Living will [place]Living will HCl[place]Living will HP[place]Living will HEvaLiving will HMidlandLiving will  Does patient want to make changes to medical advance directive? No - Patient declined  Yes (MAU/Ambulatory/Procedural Areas - Information given)    No - Patient declined  Copy of HSimsin Chart? Yes - validated most recent copy scanned in chart (See row information)   Yes - validated most recent copy scanned in chart (See row information) Yes - validated most recent copy scanned in chart (See row information) No - copy requested Yes    Current Medications (verified) Outpatient Encounter Medications as of 08/05/2022  Medication Sig   acetaminophen (TYLENOL)  500 MG tablet Take 1,000 mg by mouth daily as needed for headache.    albuterol (VENTOLIN HFA) 108 (90 Base) MCG/ACT inhaler INHALE TWO PUFFS INTO THE LUNGS EVERY 6 HOURS AS NEEDED   allopurinol (ZYLOPRIM) 100 MG tablet TAKE 1 TABLET BY MOUTH EVERY DAY   Cholecalciferol (VITAMIN D3) 125 MCG (5000 UT) CAPS Take by mouth.   co-enzyme Q-10 50 MG capsule Take 1 capsule (50 mg total) by mouth daily.   fluticasone (FLONASE) 50 MCG/ACT nasal spray USE 2 SPRAYS INTO EACH NOSTRIL ONCE DAILY AS NEEDED FOR CONGESTION   losartan (COZAAR) 50 MG tablet Take 1 tablet (50 mg total) by mouth daily.   lovastatin (MEVACOR) 20 MG tablet TAKE 1 TABLET BY MOUTH DAILY AT 6 PM.   Naproxen Sodium (ALEVE PO) Take 2 tablets by mouth as needed.   pantoprazole (PROTONIX) 40 MG tablet Take 1 tablet (40 mg total) by mouth daily.   sertraline (ZOLOFT) 100 MG tablet TAKE ONE-HALF TABLET BY MOUTH TWICE A DAY FOR PTSD   sildenafil (REVATIO) 20 MG tablet TAKE TWO TO FIVE TABLETS BY MOUTH ONCE DAILY AS NEEDED   colchicine 0.6 MG tablet Take 1 tablet (0.6 mg total) by mouth daily as needed (gout flare). First dose may take 2 tablets (1.'2mg'$ ) (Patient not taking: Reported on 08/05/2022)   furosemide (LASIX) 20 MG tablet Take 0.5 tablets (10 mg total) by mouth daily as needed for edema (or shortness of breath). (Patient not taking: Reported on 08/05/2022)   guaiFENesin-codeine (CHERATUSSIN  AC) 100-10 MG/5ML syrup Take 5 mLs by mouth 2 (two) times daily as needed for cough (sedation precautions). (Patient not taking: Reported on 08/05/2022)   indomethacin (INDOCIN) 25 MG capsule Take 1 capsule (25 mg total) by mouth 2 (two) times daily as needed (gout flare). (Patient not taking: Reported on 08/05/2022)   Misc Natural Products (OSTEO BI-FLEX ADV TRIPLE ST PO) Take by mouth. (Patient not taking: Reported on 08/05/2022)   QUERCETIN PO Take by mouth. (Patient not taking: Reported on 08/05/2022)   vitamin C (ASCORBIC ACID) 500 MG tablet Take 500 mg  by mouth daily. (Patient not taking: Reported on 08/05/2022)   Zinc 50 MG TABS Take by mouth. (Patient not taking: Reported on 08/05/2022)   No facility-administered encounter medications on file as of 08/05/2022.    Allergies (verified) Prednisone   History: Past Medical History:  Diagnosis Date   AR (allergic rhinitis)    Arthritis    PAIN AND OA LEFT KNEE ;  S/P RIGHT TOTAL KNEE REPLACEMENT   Complication of anesthesia    STATES SEVERE PAIN WAKING UP AFTER GENERAL ANESTHESIA; AND FEELS BAD.   Dislocation of finger, interphalangeal joint, left, closed 2022   left ring finger   ED (erectile dysfunction)    GERD (gastroesophageal reflux disease)    Gout    per pt   History of kidney stones    History of nephrolithiasis    seen Dr. [facility], last 02/2011   Hypercholesterolemia    RBBB (right bundle branch block)    Squamous cell skin cancer 07/2019   L clavicle Gloris [place])   Past Surgical History:  Procedure Laterality Date   2D echo  [id number]   mild diastolic dysfunction, nl EF    COLONOSCOPY  05/2006   normal, rpt 10 yrs Amedeo Plenty, [place])   KNEE ARTHROSCOPY Right 2000   L thumb joint replace Left 2005   [place]   TOTAL HIP ARTHROPLASTY Right 08/13/2016   Procedure: RIGHT TOTAL HIP ARTHROPLASTY ANTERIOR APPROACH;  Surgeon: Gaynelle Arabian, MD;  Location: [facility];  Service: Orthopedics;  Laterality: Right;   TOTAL KNEE ARTHROPLASTY Right 12/2010   TOTAL KNEE ARTHROPLASTY Left 05/08/2014   Procedure: LEFT TOTAL KNEE ARTHROPLASTY;  Surgeon: Gearlean Alf, MD;  Location: [facility];  Service: Orthopedics;  Laterality: Left;   Family History  Problem Relation Age of Onset   Cancer Father        lung and brain (smoker)   Stroke Paternal Grandmother 36       stroke or heart attack (unsure)   Cancer Mother        lung (smoker)   Cancer Maternal Grandmother        leukemia   CAD Neg Hx        unsure (see above)   Diabetes Neg Hx     Social History   Socioeconomic History   Marital status: Married    Spouse name: Not on file   Number of children: Not on file   Years of education: Not on file   Highest education level: Not on file  Occupational History   Not on file  Tobacco Use   Smoking status: Never   Smokeless tobacco: Never  Vaping Use   Vaping Use: Never used  Substance and Sexual Activity   Alcohol use: No   Drug use: No   Sexual activity: Yes  Other Topics Concern   Not on file  Social  History Narrative   Caffeine: occasionally, 2 diet cokes/day   [place] veteran   Married, lives with wife   Retired: Development worker, community   Activity: Walks and runs 3 miles a day.  at [employer]. Stays active golfing.    Diet: good water, fruits/vegetables daily, avoiding carbs and watching portion sizes   Social Determinants of Health   Financial Resource Strain: Low Risk  (08/05/2022)   Overall Financial Resource Strain (CARDIA)    Difficulty of Paying Living Expenses: Not hard at all  Food Insecurity: No Food Insecurity (08/05/2022)   Hunger Vital Sign    Worried About Running Out of Food in the Last Year: Never true    Ran Out of Food in the Last Year: Never true  Transportation Needs: No Transportation Needs (08/05/2022)   PRAPARE - Hydrologist (Medical): No    Lack of Transportation (Non-Medical): No  Physical Activity: Sufficiently Active (08/05/2022)   Exercise Vital Sign    Days of Exercise per Week: 5 days    Minutes of Exercise per Session: 120 min  Stress: Stress Concern Present (08/05/2022)   [place]    Feeling of Stress : To some extent  Social Connections: Socially Integrated (08/05/2022)   Social Connection and Isolation Panel [NHANES]    Frequency of Communication with Friends and Family: More than three times a week    Frequency of Social Gatherings with Friends and Family: More than three times a week     Attends Religious Services: More than 4 times per year    Active Member of [employer] or Organizations: Yes    Attends Archivist Meetings: 1 to 4 times per year    Marital Status: Married    Tobacco Counseling Counseling given: Not Answered   Clinical Intake:  Pre-visit preparation completed: Yes  Pain : 0-10 Pain Score: 4  Pain Location: Generalized (Arthritis) Pain Onset: More than a month ago Pain Frequency: Intermittent     Nutritional Risks: None Diabetes: No  How often do you need to have someone help you when you read instructions, pamphlets, or other written materials from your doctor or pharmacy?: 1 - Never  Diabetic? no  Interpreter Needed?: No  Information entered by :: C.Chavon Lucarelli LPN   Activities of Daily Living    08/05/2022    8:14 AM  In your present state of health, do you have any difficulty performing the following activities:  Hearing? 1  Comment wears aids  Vision? 0  Difficulty concentrating or making decisions? 0  Walking or climbing stairs? 0  Dressing or bathing? 0  Doing errands, shopping? 0  Preparing Food and eating ? N  Using the Toilet? N  In the past six months, have you accidently leaked urine? Y  Comment Occasional, not problematic  Do you have problems with loss of bowel control? N  Managing your Medications? N  Managing your Finances? N  Housekeeping or managing your Housekeeping? N    Patient Care Team: Ria Bush, MD as PCP - General (Family Medicine) Sharyne Peach, MD as Consulting Physician (Ophthalmology) Gaynelle Arabian, MD as Consulting Physician (Orthopedic Surgery)  Indicate any recent Medical Services you may have received from other than [facility] providers in the past year (date may be approximate).     Assessment:   This is a routine wellness examination for [place].  Hearing/Vision screen Hearing Screening - Comments:: Aids  Vision Screening - Comments:: Readers - Dr.Gould  Dietary issues  and exercise activities discussed: Current Exercise Habits: Structured exercise class, Type of exercise: strength training/weights;calisthenics (goes to [facility]), Time (Minutes): > 60, Frequency (Times/Week): 5, Weekly Exercise (Minutes/Week): 0, Exercise limited by: None identified   Goals Addressed             This Visit's Progress    Patient Stated       Lose 20 lbs.       Depression Screen    08/05/2022    8:10 AM 06/17/2022   11:51 AM 08/01/2021   11:26 AM 07/31/2020   11:27 AM 07/27/2019    9:51 AM 07/23/2018    8:46 AM 07/17/2017    8:26 AM  PHQ 2/9 Scores  PHQ - 2 Score 2 0 0 0 '4 4 2  '$ PHQ- 9 Score 3   0 '4 8 4    '$ Fall Risk    08/05/2022    8:00 AM 06/17/2022   11:51 AM 08/01/2021   11:25 AM 07/31/2020   11:26 AM 07/27/2019    9:50 AM  Fall Risk   Falls in the past year? 0 0 1 1 0  Number falls in past yr: 0  0 0 0  Injury with Fall? 0  1 0 0  Risk for fall due to : No Fall Risks   Medication side effect No Fall Risks  Follow up Falls prevention discussed;Falls evaluation completed  Falls prevention discussed Falls evaluation completed;Falls prevention discussed Falls evaluation completed;Falls prevention discussed    FALL RISK PREVENTION PERTAINING TO THE HOME:  Any stairs in or around the home? Yes  If so, are there any without handrails? No  Home free of loose throw rugs in walkways, pet beds, electrical cords, etc? Yes  Adequate lighting in your home to reduce risk of falls? Yes   ASSISTIVE DEVICES UTILIZED TO PREVENT FALLS:  Life alert? No  Use of a cane, walker or w/c? No  Grab bars in the bathroom? No  Shower chair or bench in shower? Yes  Elevated toilet seat or a handicapped toilet? No    Cognitive Function:    07/31/2020   11:32 AM 07/27/2019    9:55 AM 07/23/2018    8:48 AM 07/17/2017    8:30 AM 07/16/2016    1:46 PM  MMSE - Mini Mental State Exam  Orientation to time '5 5 5 5 5  '$ Orientation to Place '5 5 5 5 5  '$ Registration '3 3 3 3 3  '$ Attention/  Calculation 5 5 0 0 0  Recall '3 3 3 3 3  '$ Language- name 2 objects   0 0 0  Language- repeat '1 1 1 1 1  '$ Language- follow 3 step command   '3 3 3  '$ Language- read & follow direction   0 0 0  Write a sentence   0 0 0  Copy design   0 0 0  Total score   '20 20 20        '$ 08/05/2022    8:16 AM  6CIT Screen  What Year? 0 points  What month? 0 points  What time? 0 points  Count back from 20 0 points  Months in reverse 0 points  Repeat phrase 0 points  Total Score 0 points    Immunizations Immunization History  Administered Date(s) Administered   Fluad Quad(high Dose 65+) 03/08/2020, 04/09/2022   Influenza Whole 02/20/2010, 02/26/2011   Influenza, High Dose Seasonal PF 02/19/2017, 02/23/2018, 02/10/2019, 02/21/2021   Influenza,inj,Quad PF,6+  Mos 02/23/2013, 03/22/2014, 03/12/2015, 03/11/2016   Influenza,inj,quad, With Preservative 02/23/2018   Pneumococcal Conjugate-13 06/21/2014   Pneumococcal Polysaccharide-23 06/12/2010   Td 07/24/2009   Tdap 10/14/2021   Zoster Recombinat (Shingrix) 04/07/2022    TDAP status: Up to date  Flu Vaccine status: Up to date  Pneumococcal vaccine status: Up to date  Covid-19 vaccine status: Declined, Education has been provided regarding the importance of this vaccine but patient still declined. Advised may receive this vaccine at local pharmacy or Health Dept.or vaccine clinic. Aware to provide a copy of the vaccination record if obtained from local pharmacy or Health Dept. Verbalized acceptance and understanding.  Qualifies for Shingles Vaccine? Yes   Zostavax completed  unknown   Shingrix Completed?: Yes  Screening Tests Health Maintenance  Topic Date Due   COVID-19 Vaccine (1) Never done   Medicare Annual Wellness (AWV)  08/05/2023   DTaP/Tdap/Td (3 - Td or Tdap) 10/15/2031   Pneumonia Vaccine 79+ Years old  Completed   INFLUENZA VACCINE  Completed   Hepatitis C Screening  Completed   Zoster Vaccines- Shingrix  Completed   HPV  VACCINES  Aged Out   Fecal DNA (Cologuard)  Discontinued    Health Maintenance  Health Maintenance Due  Topic Date Due   COVID-19 Vaccine (1) Never done    Colorectal cancer screening: No longer required.   Lung Cancer Screening: (Low Dose CT Chest recommended if Age 76-80 years, 30 pack-year currently smoking OR have quit w/in 15years.) does not qualify.   Lung Cancer Screening Referral: no  Additional Screening:  Hepatitis C Screening: does not qualify; Completed 06/26/15  Vision Screening: Recommended annual ophthalmology exams for early detection of glaucoma and other disorders of the eye. Is the patient up to date with their annual eye exam?  Yes  Who is the provider or what is the name of the office in which the patient attends annual eye exams? Dr.Gould If pt is not established with a provider, would they like to be referred to a provider to establish care? No .   Dental Screening: Recommended annual dental exams for proper oral hygiene  Community Resource Referral / Chronic Care Management: CRR required this visit?  No   CCM required this visit?  No      Plan:     I have personally reviewed and noted the following in the patient's chart:   Medical and social history Use of alcohol, tobacco or illicit drugs  Current medications and supplements including opioid prescriptions. Patient is not currently taking opioid prescriptions. Functional ability and status Nutritional status Physical activity Advanced directives List of other physicians Hospitalizations, surgeries, and ER visits in previous 12 months Vitals Screenings to include cognitive, depression, and falls Referrals and appointments  In addition, I have reviewed and discussed with patient certain preventive protocols, quality metrics, and best practice recommendations. A written personalized care plan for preventive services as well as general preventive health recommendations were provided to  patient.     Lebron Conners, LPN   [id number]   Nurse Notes: Pt declines COVID vaccine.

## 2022-08-12 ENCOUNTER — Telehealth: Payer: Self-pay | Admitting: Family Medicine

## 2022-08-12 ENCOUNTER — Encounter: Payer: Self-pay | Admitting: Family Medicine

## 2022-08-12 ENCOUNTER — Ambulatory Visit (INDEPENDENT_AMBULATORY_CARE_PROVIDER_SITE_OTHER): Payer: Medicare HMO | Admitting: Family Medicine

## 2022-08-12 VITALS — BP 136/76 | HR 76 | Temp 97.4°F | Ht 67.0 in | Wt 222.5 lb

## 2022-08-12 DIAGNOSIS — R0609 Other forms of dyspnea: Secondary | ICD-10-CM | POA: Diagnosis not present

## 2022-08-12 DIAGNOSIS — R011 Cardiac murmur, unspecified: Secondary | ICD-10-CM | POA: Diagnosis not present

## 2022-08-12 DIAGNOSIS — K219 Gastro-esophageal reflux disease without esophagitis: Secondary | ICD-10-CM

## 2022-08-12 DIAGNOSIS — R059 Cough, unspecified: Secondary | ICD-10-CM

## 2022-08-12 DIAGNOSIS — R3915 Urgency of urination: Secondary | ICD-10-CM | POA: Diagnosis not present

## 2022-08-12 DIAGNOSIS — Z0000 Encounter for general adult medical examination without abnormal findings: Secondary | ICD-10-CM

## 2022-08-12 DIAGNOSIS — E669 Obesity, unspecified: Secondary | ICD-10-CM | POA: Diagnosis not present

## 2022-08-12 DIAGNOSIS — R7303 Prediabetes: Secondary | ICD-10-CM

## 2022-08-12 DIAGNOSIS — E7800 Pure hypercholesterolemia, unspecified: Secondary | ICD-10-CM

## 2022-08-12 DIAGNOSIS — Z1211 Encounter for screening for malignant neoplasm of colon: Secondary | ICD-10-CM

## 2022-08-12 DIAGNOSIS — I10 Essential (primary) hypertension: Secondary | ICD-10-CM | POA: Diagnosis not present

## 2022-08-12 DIAGNOSIS — M1A0790 Idiopathic chronic gout, unspecified ankle and foot, without tophus (tophi): Secondary | ICD-10-CM

## 2022-08-12 DIAGNOSIS — Z7189 Other specified counseling: Secondary | ICD-10-CM | POA: Diagnosis not present

## 2022-08-12 MED ORDER — ALLOPURINOL 100 MG PO TABS: 100.0000 mg | ORAL_TABLET | Freq: Every day | ORAL | 4 refills | Status: DC

## 2022-08-12 MED ORDER — LOVASTATIN 20 MG PO TABS: 20.0000 mg | ORAL_TABLET | Freq: Every day | ORAL | 4 refills | Status: DC

## 2022-08-12 MED ORDER — DOXAZOSIN MESYLATE 1 MG PO TABS: 1.0000 mg | ORAL_TABLET | Freq: Every day | ORAL | 4 refills | Status: DC

## 2022-08-12 MED ORDER — PANTOPRAZOLE SODIUM 40 MG PO TBEC: 40.0000 mg | DELAYED_RELEASE_TABLET | Freq: Every day | ORAL | 4 refills | Status: DC

## 2022-08-12 NOTE — Assessment & Plan Note (Signed)
Notes ongoing mild exertional dyspnea for which he is using albuterol inhaler 2-3 times daily. Discussed rescue inhaler use, recommend only as needed sparing use, suggested he try to taper down on use. Check peak flow today - 84% predicted.  No known h/o asthma or COPD - if ongoing, dyspnea, consider spirometry for further evaluation.

## 2022-08-12 NOTE — Assessment & Plan Note (Signed)
Not appreciated today.  

## 2022-08-12 NOTE — Telephone Encounter (Signed)
Plz notify - I'd like him to return to collect a urine sample to ensure no signs of infection or other cause of urinary urgency - I forgot to check while he was here. I've ordered to send out.

## 2022-08-12 NOTE — Assessment & Plan Note (Signed)
Largely resolved after latest treatment.

## 2022-08-12 NOTE — Patient Instructions (Addendum)
We will sign you up for repeat cologuard  Continue losartan 50mg  daily, add cardura 1mg  nightly for both blood pressure and urination. Let us know if you develop bad dizziness when standing.  Ok to stop furosemide (lasix).  Peak flow measurement today.  Return as needed or in 6 months for follow up visit.

## 2022-08-12 NOTE — Progress Notes (Signed)
Patient ID: [name], male    DOB: 1946/03/16, 77 y.o.   MRN: [id number]  This visit was conducted in person.  BP 136/76   Pulse 76   Temp (!) 97.4 F (36.3 C) (Temporal)   Ht 5\' 7"  (1.702 m)   Wt 222 lb 8 oz (100.9 kg)   SpO2 94%   PF 400 L/min Comment: 1) 330, 2) 370, 3) 400  BMI 34.85 kg/m   BP Readings from Last 3 Encounters:  08/12/22 136/76  06/17/22 (!) 148/82  06/02/22 (!) 142/94   CC: CPE Subjective:   HPI: [name] is a 77 y.o. male presenting on 08/12/2022 for Annual Exam (MCR prt 2 [AWV- 08/05/22]. Wants to discuss BP. States really elevated when first thing in AM. )   Saw health advisor last week for medicare wellness visit. Note reviewed.   No results found.  [facility] Visit from 08/12/2022 in [facility] at [facility]  PHQ-2 Total Score 0          08/12/2022   11:39 AM 08/05/2022    8:00 AM 06/17/2022   11:51 AM 08/01/2021   11:25 AM 07/31/2020   11:26 AM  Fall Risk   Falls in the past year? 0 0 0 1 1  Number falls in past yr:  0  0 0  Injury with Fall?  0  1 0  Risk for fall due to :  No Fall Risks   Medication side effect  Follow up  Falls prevention discussed;Falls evaluation completed  Falls prevention discussed Falls evaluation completed;Falls prevention discussed   HTN - notes home BP readings run elevated in the mornings to 0000000 systolic. Wakes up at 5am to go workout for 2 hours, then takes BP med at 10am. Can come home and mow the lawn, enjoys playing golf. Stays very active.  He continues working on weight loss as well as healthy diet changes, also limiting caffeine.   Continues albuterol 2-3 times daily for some wheezing. Mild cough. No chest congestion, dyspnea.  Preventative: Colonoscopy 2008 - normal, rpt 10 yrs [name], [place]). Cologuard negative 07/2016, 07/2019. rpt today.  Prostate - normal yearly screen. PSA previously reassuring - aged out. Lung cancer screen - not eligible  Flu shot  yearly  COVID vaccine - declines  Pneumovax 05/2010, prevnar-13 2016  Td 07/2009, Tdap 09/2021 Shingrix - 03/2022, 06/2022 through [place] directives - wife [name] then son [name]) would be HCPOA. Brought form, reviewed and scanned 06/2016.  Seat belt use discussed.  Sunscreen use discussed. Seeing derm for recent skin cancer treatment.  Sleep - averaging 6 hours/night Non smoker  Alcohol - rare  Dentist q3 mo Eye exam yearly  Bowel - no constipation  Bladder - notes intermittent urinary urgency    Caffeine: occaisonally, 2 diet cokes/day Married, lives with wife [name] veteran Retired Development worker, community Activity: Walks and runs 3 miles a day at [employer] 6d/wk. works out 2-3hr/day. Golfing.  Diet: good water, fruits/vegetables daily, avoiding carbs and watching portion sizes     Relevant past medical, surgical, family and social history reviewed and updated as indicated. Interim medical history since our last visit reviewed. Allergies and medications reviewed and updated. Outpatient Medications Prior to Visit  Medication Sig Dispense Refill   acetaminophen (TYLENOL) 500 MG tablet Take 1,000 mg by mouth daily as needed for headache.      albuterol (VENTOLIN HFA) 108 (90 Base) MCG/ACT inhaler INHALE TWO PUFFS  INTO THE LUNGS EVERY 6 HOURS AS NEEDED 18 each 3   Cholecalciferol (VITAMIN D3) 125 MCG (5000 UT) CAPS Take by mouth.     co-enzyme Q-10 50 MG capsule Take 1 capsule (50 mg total) by mouth daily.     colchicine 0.6 MG tablet Take 1 tablet (0.6 mg total) by mouth daily as needed (gout flare). First dose may take 2 tablets (1.2mg ) 30 tablet 3   fluticasone (FLONASE) 50 MCG/ACT nasal spray USE 2 SPRAYS INTO EACH NOSTRIL ONCE DAILY AS NEEDED FOR CONGESTION 16 mL 9   furosemide (LASIX) 20 MG tablet Take 0.5 tablets (10 mg total) by mouth daily as needed for edema (or shortness of breath). 10 tablet 0   indomethacin (INDOCIN) 25 MG capsule Take 1 capsule (25 mg total) by mouth 2  (two) times daily as needed (gout flare). 60 capsule 0   losartan (COZAAR) 50 MG tablet Take 1 tablet (50 mg total) by mouth daily. 30 tablet 4   Misc Natural Products (OSTEO BI-FLEX ADV TRIPLE ST PO) Take by mouth.     Naproxen Sodium (ALEVE PO) Take 2 tablets by mouth as needed.     QUERCETIN PO Take by mouth.     sertraline (ZOLOFT) 100 MG tablet TAKE ONE-HALF TABLET BY MOUTH TWICE A DAY FOR PTSD     sildenafil (REVATIO) 20 MG tablet TAKE TWO TO FIVE TABLETS BY MOUTH ONCE DAILY AS NEEDED 45 tablet 1   vitamin C (ASCORBIC ACID) 500 MG tablet Take 500 mg by mouth daily.     Zinc 50 MG TABS Take by mouth.     allopurinol (ZYLOPRIM) 100 MG tablet TAKE 1 TABLET BY MOUTH EVERY DAY 90 tablet 0   lovastatin (MEVACOR) 20 MG tablet TAKE 1 TABLET BY MOUTH DAILY AT 6 PM. 90 tablet 0   pantoprazole (PROTONIX) 40 MG tablet Take 1 tablet (40 mg total) by mouth daily. 90 tablet 3   guaiFENesin-codeine (CHERATUSSIN AC) 100-10 MG/5ML syrup Take 5 mLs by mouth 2 (two) times daily as needed for cough (sedation precautions). 120 mL 0   No facility-administered medications prior to visit.     Per HPI unless specifically indicated in ROS section below Review of Systems  Constitutional:  Negative for activity change, appetite change, chills, fatigue, fever and unexpected weight change.  HENT:  Negative for hearing loss.   Eyes:  Negative for visual disturbance.  Respiratory:  Positive for cough (occ) and wheezing (occ - continues using albuterol rescue inh). Negative for chest tightness and shortness of breath.   Cardiovascular:  Negative for chest pain, palpitations and leg swelling.  Gastrointestinal:  Negative for abdominal distention, abdominal pain, blood in stool, constipation, diarrhea, nausea and vomiting.  Genitourinary:  Negative for difficulty urinating and hematuria.  Musculoskeletal:  Negative for arthralgias, myalgias and neck pain.  Skin:  Negative for rash.  Neurological:  Negative for  dizziness, seizures, syncope and headaches.  Hematological:  Negative for adenopathy. Does not bruise/bleed easily.  Psychiatric/Behavioral:  Negative for dysphoric mood. The patient is not nervous/anxious.     Objective:  BP 136/76   Pulse 76   Temp (!) 97.4 F (36.3 C) (Temporal)   Ht 5\' 7"  (1.702 m)   Wt 222 lb 8 oz (100.9 kg)   SpO2 94%   PF 400 L/min Comment: 1) 330, 2) 370, 3) 400  BMI 34.85 kg/m   Wt Readings from Last 3 Encounters:  08/12/22 222 lb 8 oz (100.9 kg)  08/05/22 217  lb (98.4 kg)  06/17/22 223 lb 4 oz (101.3 kg)      Physical Exam Vitals and nursing note reviewed.  Constitutional:      General: He is not in acute distress.    Appearance: Normal appearance. He is well-developed. He is not ill-appearing.  HENT:     Head: Normocephalic and atraumatic.     Right Ear: Hearing, tympanic membrane, ear canal and external ear normal.     Left Ear: Hearing, tympanic membrane, ear canal and external ear normal.     Mouth/Throat:     Mouth: Mucous membranes are moist.     Pharynx: Oropharynx is clear. No oropharyngeal exudate or posterior oropharyngeal erythema.  Eyes:     General: No scleral icterus.    Extraocular Movements: Extraocular movements intact.     Conjunctiva/sclera: Conjunctivae normal.     Pupils: Pupils are equal, round, and reactive to light.  Neck:     Thyroid: No thyroid mass or thyromegaly.     Vascular: No carotid bruit.  Cardiovascular:     Rate and Rhythm: Normal rate and regular rhythm.     Pulses: Normal pulses.          Radial pulses are 2+ on the right side and 2+ on the left side.     Heart sounds: Normal heart sounds. No murmur heard. Pulmonary:     Effort: Pulmonary effort is normal. No respiratory distress.     Breath sounds: Normal breath sounds. No wheezing, rhonchi or rales.  Abdominal:     General: Bowel sounds are normal. There is no distension.     Palpations: Abdomen is soft. There is no mass.     Tenderness: There is  no abdominal tenderness. There is no guarding or rebound.     Hernia: No hernia is present.  Musculoskeletal:        General: Normal range of motion.     Cervical back: Normal range of motion and neck supple.     Right lower leg: No edema.     Left lower leg: No edema.  Lymphadenopathy:     Cervical: No cervical adenopathy.  Skin:    General: Skin is warm and dry.     Findings: No rash.  Neurological:     General: No focal deficit present.     Mental Status: He is alert and oriented to person, place, and time.  Psychiatric:        Mood and Affect: Mood normal.        Behavior: Behavior normal.        Thought Content: Thought content normal.        Judgment: Judgment normal.       Results for orders placed or performed in visit on 08/05/22  TSH  Result Value Ref Range   TSH 3.06 0.35 - 5.50 uIU/mL  Hemoglobin A1c  Result Value Ref Range   Hgb A1c MFr Bld 6.0 4.6 - 6.5 %  Uric acid  Result Value Ref Range   Uric Acid, Serum 6.0 4.0 - 7.8 mg/dL  Comprehensive metabolic panel  Result Value Ref Range   Sodium 141 135 - 145 mEq/L   Potassium 4.0 3.5 - 5.1 mEq/L   Chloride 102 96 - 112 mEq/L   CO2 29 19 - 32 mEq/L   Glucose, Bld 89 70 - 99 mg/dL   BUN 14 6 - 23 mg/dL   Creatinine, Ser 0.95 0.40 - 1.50 mg/dL   Total Bilirubin 0.7 0.2 -  1.2 mg/dL   Alkaline Phosphatase 96 39 - 117 U/L   AST 28 0 - 37 U/L   ALT 33 0 - 53 U/L   Total Protein 7.0 6.0 - 8.3 g/dL   Albumin 4.2 3.5 - 5.2 g/dL   GFR 77.01 >60.00 mL/min   Calcium 9.7 8.4 - 10.5 mg/dL  Lipid panel  Result Value Ref Range   Cholesterol 169 0 - 200 mg/dL   Triglycerides 102.0 0.0 - 149.0 mg/dL   HDL 42.90 >39.00 mg/dL   VLDL 20.4 0.0 - 40.0 mg/dL   LDL Cholesterol 106 (H) 0 - 99 mg/dL   Total CHOL/HDL Ratio 4    NonHDL 126.33    PF best = 400 Expected for age/height = 475 L/min  Assessment & Plan:   Problem List Items Addressed This Visit     Health maintenance examination - Primary (Chronic)     Preventative protocols reviewed and updated unless pt declined. Discussed healthy diet and lifestyle.       Advanced care planning/counseling discussion (Chronic)    Previously discussed      HYPERCHOLESTEROLEMIA    Chronic, overall stable on lovastatin.  History of intolerance to simvastatin and pravastatin in the past. The 10-year ASCVD risk score (Arnett DK, et al., 2019) is: 34.7%   Values used to calculate the score:     Age: 1 years     Sex: Male     Is Non-Hispanic African American: No     Diabetic: No     Tobacco smoker: No     Systolic Blood Pressure: XX123456 mmHg     Is BP treated: Yes     HDL Cholesterol: 42.9 mg/dL     Total Cholesterol: 169 mg/dL      Relevant Medications   lovastatin (MEVACOR) 20 MG tablet   doxazosin (CARDURA) 1 MG tablet   Exertional dyspnea    Notes ongoing mild exertional dyspnea for which he is using albuterol inhaler 2-3 times daily. Discussed rescue inhaler use, recommend only as needed sparing use, suggested he try to taper down on use. Check peak flow today - 84% predicted.  No known h/o asthma or COPD - if ongoing, dyspnea, consider spirometry for further evaluation.       Prediabetes    Reviewed prediabetes diagnosis, recommend limiting added sugars and simple carbs in diet.      Cough    Largely resolved after latest treatment.      GERD (gastroesophageal reflux disease)    Chronic, stable on pantoprazole daily.      Relevant Medications   pantoprazole (PROTONIX) 40 MG tablet   Obesity, Class I, BMI 30-34.9    Continue to encourage healthy diet and lifestyle changes to affect sustainable weight loss.        Chronic gout    Chronic, stable on low-dose allopurinol-continue.      Relevant Medications   allopurinol (ZYLOPRIM) 100 MG tablet   Systolic murmur    Not appreciated today.      Hypertension    Chronic, overall stable however he notes elevated blood pressure readings in the mornings up to 0000000 systolic.  He then  goes to the gym for a 2-hour workout and takes his blood pressure medicine when he returns home.  Recommend starting nighttime Cardura 1 mg to benefit a.m. blood pressures and possible prostate symptoms.  Continue losartan 50 mg daily.      Relevant Medications   lovastatin (MEVACOR) 20 MG tablet   doxazosin (  CARDURA) 1 MG tablet   Urinary urgency    Notes intermittent urinary urgency - anticipate BPH related - will trial cardura 1mg  nightly as per above.  Will also check UA r/o UTI.       Relevant Orders   Urinalysis, Routine w reflex microscopic   Other Visit Diagnoses     Special screening for malignant neoplasms, colon       Relevant Orders   Cologuard        Meds ordered this encounter  Medications   allopurinol (ZYLOPRIM) 100 MG tablet    Sig: Take 1 tablet (100 mg total) by mouth daily. Daily for gout prevention    Dispense:  90 tablet    Refill:  4   lovastatin (MEVACOR) 20 MG tablet    Sig: Take 1 tablet (20 mg total) by mouth at bedtime. Daily for cholesterol    Dispense:  90 tablet    Refill:  4   pantoprazole (PROTONIX) 40 MG tablet    Sig: Take 1 tablet (40 mg total) by mouth daily. Daily for reflux    Dispense:  90 tablet    Refill:  4   doxazosin (CARDURA) 1 MG tablet    Sig: Take 1 tablet (1 mg total) by mouth at bedtime.    Dispense:  90 tablet    Refill:  4    Orders Placed This Encounter  Procedures   Cologuard   Urinalysis, Routine w reflex microscopic    Standing Status:   Future    Standing Expiration Date:   08/12/2023    Patient Instructions  We will sign you up for repeat cologuard  Continue losartan 50mg  daily, add cardura 1mg  nightly for both blood pressure and urination. Let us know if you develop bad dizziness when standing.  Ok to stop furosemide (lasix).  Peak flow measurement today.  Return as needed or in 6 months for follow up visit.   Follow up plan: Return in about 6 months (around 02/12/2023) for follow up visit.  Ria Bush, MD

## 2022-08-12 NOTE — Assessment & Plan Note (Addendum)
Chronic, overall stable however he notes elevated blood pressure readings in the mornings up to 0000000 systolic.  He then goes to the gym for a 2-hour workout and takes his blood pressure medicine when he returns home.  Recommend starting nighttime Cardura 1 mg to benefit a.m. blood pressures and possible prostate symptoms.  Continue losartan 50 mg daily.

## 2022-08-12 NOTE — Assessment & Plan Note (Signed)
Chronic, stable on pantoprazole daily.

## 2022-08-12 NOTE — Assessment & Plan Note (Signed)
Preventative protocols reviewed and updated unless pt declined. Discussed healthy diet and lifestyle.  

## 2022-08-12 NOTE — Assessment & Plan Note (Signed)
Chronic, overall stable on lovastatin.  History of intolerance to simvastatin and pravastatin in the past. The 10-year ASCVD risk score (Arnett DK, et al., 2019) is: 34.7%   Values used to calculate the score:     Age: 77 years     Sex: Male     Is Non-Hispanic African American: No     Diabetic: No     Tobacco smoker: No     Systolic Blood Pressure: [id number] mmHg     Is BP treated: Yes     HDL Cholesterol: 42.9 mg/dL     Total Cholesterol: 169 mg/dL

## 2022-08-12 NOTE — Telephone Encounter (Addendum)
Spoke with pt relaying Dr. Synthia Innocent message. Pt verbalizes understanding and will come back today before 4:30 to leave urine sample to send out.

## 2022-08-12 NOTE — Assessment & Plan Note (Addendum)
Notes intermittent urinary urgency - anticipate BPH related - will trial cardura 1mg  nightly as per above.  Will also check UA r/o UTI.

## 2022-08-12 NOTE — Assessment & Plan Note (Signed)
Reviewed prediabetes diagnosis, recommend limiting added sugars and simple carbs in diet.

## 2022-08-12 NOTE — Assessment & Plan Note (Signed)
Continue to encourage healthy diet and lifestyle changes to affect sustainable weight loss.  

## 2022-08-12 NOTE — Assessment & Plan Note (Signed)
Previously discussed.

## 2022-08-12 NOTE — Assessment & Plan Note (Signed)
Chronic, stable on low-dose allopurinol-continue.

## 2022-08-13 LAB — URINALYSIS, ROUTINE W REFLEX MICROSCOPIC
Bilirubin Urine: NEGATIVE
Hgb urine dipstick: NEGATIVE
Ketones, ur: NEGATIVE
Leukocytes,Ua: NEGATIVE
Nitrite: NEGATIVE
Specific Gravity, Urine: 1.025 (ref 1.000–1.030)
Total Protein, Urine: NEGATIVE
Urine Glucose: NEGATIVE
Urobilinogen, UA: 0.2 (ref 0.0–1.0)
pH: 6 (ref 5.0–8.0)

## 2022-08-18 DIAGNOSIS — Z1211 Encounter for screening for malignant neoplasm of colon: Secondary | ICD-10-CM | POA: Diagnosis not present

## 2022-08-22 LAB — COLOGUARD: COLOGUARD: NEGATIVE

## 2022-09-12 ENCOUNTER — Other Ambulatory Visit: Payer: Self-pay | Admitting: Family Medicine

## 2022-09-12 DIAGNOSIS — I10 Essential (primary) hypertension: Secondary | ICD-10-CM

## 2022-10-01 ENCOUNTER — Encounter: Payer: Self-pay | Admitting: Family Medicine

## 2022-10-01 ENCOUNTER — Ambulatory Visit (INDEPENDENT_AMBULATORY_CARE_PROVIDER_SITE_OTHER): Payer: Medicare HMO | Admitting: Family Medicine

## 2022-10-01 VITALS — BP 126/72 | HR 92 | Temp 97.4°F | Ht 67.0 in | Wt 215.4 lb

## 2022-10-01 DIAGNOSIS — M1A0790 Idiopathic chronic gout, unspecified ankle and foot, without tophus (tophi): Secondary | ICD-10-CM

## 2022-10-01 DIAGNOSIS — M7751 Other enthesopathy of right foot: Secondary | ICD-10-CM | POA: Diagnosis not present

## 2022-10-01 MED ORDER — NAPROXEN 500 MG PO TABS: 500.0000 mg | ORAL_TABLET | Freq: Two times a day (BID) | ORAL | 0 refills | Status: DC

## 2022-10-01 NOTE — Assessment & Plan Note (Signed)
Chronic, stable on low dose allopurinol. Doubt gout contributing.

## 2022-10-01 NOTE — Patient Instructions (Signed)
I think you have heel bursitis (retrocalcaneal bursitis). Treat with naprosyn 500mg  twice daily with meals for 1 week then as needed. Use topical voltaren gel 2-3 times daily to that area.  Elevate leg with pillow Use gel insert heel lift for closed toed shoes. Schedule appointment with Dr Patsy Lager sports medicine.

## 2022-10-01 NOTE — Progress Notes (Addendum)
Ph: [phone]       Fax: [phone]   Patient ID: [name], male    DOB: 10-08-1945, 77 y.o.   MRN: [id number]  This visit was conducted in person.  BP 126/72   Pulse 92   Temp (!) 97.4 F (36.3 C) (Temporal)   Ht 5\' 7"  (1.702 m)   Wt 215 lb 6 oz (97.7 kg)   SpO2 93%   BMI 33.73 kg/m    CC: R ankle pain  Subjective:   HPI: [name] is a 77 y.o. male presenting on 10/01/2022 for Ankle Pain (C/o R ankle pain and noticed a knot. Started yrs ago. Seen previously for sxs. Pt accompanied by wife, [name].)   Very physically active 77yo, goes to gym daily. They've been busy working hard in the yard recently. He walked 2 hours this morning on trail - worsened symptoms.   Several years of right posterior foot pain at achilles, more recently noticed knot a few months ago. Not red or hot.  Symptoms typically worse during the summer, when he's more active.  Currently worsened after above activity.  Treats pain with aleve 440mg  once daily and ice.   Previously seen by [facility] - referred to PT which actually worsened pain.  H/o remote achilles tear treated conservatively with boot  H/o gout on allopurinol 100mg  daily with colchicine and indocin PRN.  Lab Results  Component Value Date   LABURIC 6.0 08/05/2022  Last gout flare was sometime last year.      Relevant past medical, surgical, family and social history reviewed and updated as indicated. Interim medical history since our last visit reviewed. Allergies and medications reviewed and updated. Outpatient Medications Prior to Visit  Medication Sig Dispense Refill   acetaminophen (TYLENOL) 500 MG tablet Take 1,000 mg by mouth daily as needed for headache.      albuterol (VENTOLIN HFA) 108 (90 Base) MCG/ACT inhaler INHALE TWO PUFFS INTO THE LUNGS EVERY 6 HOURS AS NEEDED 18 each 3   allopurinol (ZYLOPRIM) 100 MG tablet Take 1 tablet (100 mg total) by mouth daily. Daily for gout prevention 90 tablet 4    Cholecalciferol (VITAMIN D3) 125 MCG (5000 UT) CAPS Take by mouth.     co-enzyme Q-10 50 MG capsule Take 1 capsule (50 mg total) by mouth daily.     colchicine 0.6 MG tablet Take 1 tablet (0.6 mg total) by mouth daily as needed (gout flare). First dose may take 2 tablets (1.2mg ) 30 tablet 3   doxazosin (CARDURA) 1 MG tablet Take 1 tablet (1 mg total) by mouth at bedtime. 90 tablet 4   fluticasone (FLONASE) 50 MCG/ACT nasal spray USE 2 SPRAYS INTO EACH NOSTRIL ONCE DAILY AS NEEDED FOR CONGESTION 16 mL 9   furosemide (LASIX) 20 MG tablet Take 0.5 tablets (10 mg total) by mouth daily as needed for edema (or shortness of breath). 10 tablet 0   indomethacin (INDOCIN) 25 MG capsule Take 1 capsule (25 mg total) by mouth 2 (two) times daily as needed (gout flare). 60 capsule 0   losartan (COZAAR) 50 MG tablet TAKE 1 TABLET BY MOUTH EVERY DAY 30 tablet 12   lovastatin (MEVACOR) 20 MG tablet Take 1 tablet (20 mg total) by mouth at bedtime. Daily for cholesterol 90 tablet 4   Misc Natural Products (OSTEO BI-FLEX ADV TRIPLE ST PO) Take by mouth.     Naproxen Sodium (ALEVE PO) Take 2 tablets by mouth as needed.  pantoprazole (PROTONIX) 40 MG tablet Take 1 tablet (40 mg total) by mouth daily. Daily for reflux 90 tablet 4   QUERCETIN PO Take by mouth.     sertraline (ZOLOFT) 100 MG tablet TAKE ONE-HALF TABLET BY MOUTH TWICE A DAY FOR PTSD     sildenafil (REVATIO) 20 MG tablet TAKE TWO TO FIVE TABLETS BY MOUTH ONCE DAILY AS NEEDED 45 tablet 1   vitamin C (ASCORBIC ACID) 500 MG tablet Take 500 mg by mouth daily.     Zinc 50 MG TABS Take by mouth.     No facility-administered medications prior to visit.     Per HPI unless specifically indicated in ROS section below Review of Systems  Objective:  BP 126/72   Pulse 92   Temp (!) 97.4 F (36.3 C) (Temporal)   Ht 5\' 7"  (1.702 m)   Wt 215 lb 6 oz (97.7 kg)   SpO2 93%   BMI 33.73 kg/m   Wt Readings from Last 3 Encounters:  10/01/22 215 lb 6 oz (97.7  kg)  08/12/22 222 lb 8 oz (100.9 kg)  08/05/22 217 lb (98.4 kg)      Physical Exam Vitals and nursing note reviewed.  Constitutional:      Appearance: Normal appearance. He is not ill-appearing.  Musculoskeletal:        General: Swelling and tenderness present. Normal range of motion.     Right lower leg: No edema.     Left lower leg: No edema.       Legs:     Comments:  2+ DP/PT bilaterally L foot WNL R foot: Marked tender swelling to posterior heel at insertion of achilles into calcaneus, mild warmth, no erythema No pain at bilateral ankle ligaments, no ligament laxity No pain with palpation of heel   Skin:    General: Skin is warm and dry.     Findings: No erythema.  Neurological:     Mental Status: He is alert.  Psychiatric:        Mood and Affect: Mood normal.        Behavior: Behavior normal.       Lab Results  Component Value Date   CREATININE 0.95 08/05/2022   BUN 14 08/05/2022   NA 141 08/05/2022   K 4.0 08/05/2022   CL 102 08/05/2022   CO2 29 08/05/2022   Lab Results  Component Value Date   HGBA1C 6.0 08/05/2022   Assessment & Plan:   Problem List Items Addressed This Visit     Chronic gout    Chronic, stable on low dose allopurinol. Doubt gout contributing.       Right calcaneal bursitis - Primary    Anticipate chronic retrocalcaneal bursitis, worsening.  Rx naprosyn 500mg  BID x1 wk with meals then PRN.  Rec leg elevation (on pillow support), ice, gel insert heel lift.  Rec f/u with sports medicine for further management, recommendations on exercise regimen.  Reviewed SSRI/NSAID interaction and signs of GI upset, bleed.         Meds ordered this encounter  Medications   naproxen (NAPROSYN) 500 MG tablet    Sig: Take 1 tablet (500 mg total) by mouth 2 (two) times daily with a meal. For 1 week then as needed    Dispense:  30 tablet    Refill:  0    No orders of the defined types were placed in this encounter.   Patient Instructions   I think you have heel bursitis (retrocalcaneal  bursitis). Treat with naprosyn 500mg  twice daily with meals for 1 week then as needed. Use topical voltaren gel 2-3 times daily to that area.  Elevate leg with pillow Use gel insert heel lift for closed toed shoes. Schedule appointment with Dr Patsy Lager sports medicine.   Follow up plan: Return if symptoms worsen or fail to improve.  Eustaquio Boyden, MD

## 2022-10-01 NOTE — Assessment & Plan Note (Addendum)
Anticipate chronic retrocalcaneal bursitis, worsening.  Rx naprosyn 500mg  BID x1 wk with meals then PRN.  Rec leg elevation (on pillow support), ice, gel insert heel lift.  Rec f/u with sports medicine for further management, recommendations on exercise regimen.  Reviewed SSRI/NSAID interaction and signs of GI upset, bleed.

## 2022-10-08 NOTE — Progress Notes (Signed)
[name] T. Richard Captain, MD, CAQ Sports Medicine [facility] at [facility] [address] [place], [address]  Phone: [phone]  FAX: [phone]  EIAN Pena - 77 y.o. male  MRN [id number]  Date of Birth: 1946/02/21  Date: 10/09/2022  PCP: Richard Boyden, MD  Referral: Richard Boyden, MD  Chief Complaint  Patient presents with   Ankle Pain    Right   Subjective:   Pleasant patient who presents with a > 1 year history of R posterior heel pain.  No occult, abrupt onset. Has been more insidious in character. There is a dull ache present and worse with activity:  Recently exacerbated with going on a hike.  Dr. Reece Pena recently placed him on Naprosyn as well as topical Voltaren gel.  Recommended a heel cup, as well.  Achilles R  tendonitis - ongoing for more than a year  Saw [name] before earlier in the year Also went to PT at [facility] - did not think that it helped at all.   Achilles rehab Built a heel lift  Prior home rehab: various directed by PT Prior meds: NSAIDS, voltaren gel Orthosis / Braces: none   Review of Systems is noted in the HPI, as appropriate  Objective:   Blood pressure 110/60, pulse 85, temperature 98.1 F (36.7 C), temperature source Temporal, height 5\' 7"  (1.702 m), weight 222 lb 6 oz (100.9 kg), SpO2 95 %.   Foot: R Echymosis: no Edema: no ROM: full LE B Gait: heel toe, non-antalgic MT pain: no Callus pattern: none Lateral Mall: NT Medial Mall: NT Talus: NT Navicular: NT Cuboid: NT Calcaneous: NT Metatarsals: NT 5th MT: NT Phalanges: NT Achilles: PAINFUL TO PALPATE AT INSERTION ON right, moderate to large NODULE Plantar Fascia: NT Fat Pad: NT Peroneals: NT Post Tib: NT Great Toe: Nml motion Ant Drawer: neg ATFL: NT CFL: NT Deltoid: NT Sensation: intact  Radiology: No results found.  Assessment and Plan:     ICD-10-CM   1. Achilles tendinitis of right lower extremity   M76.61      Pathophysiology of achilles tendinopathy reviewed.  Additionally, I have given the patient the program emphasizing eccentric overloading detailed in the instructions based on [name] work and protocols.  Supportive footwear reviewed.   I custom built a lift to increase the lift volume on his rubberized heel cup, and he felt like it felt better when placed back in his shoe.   Greater than 1 year of symptoms from a very significant Achilles tendinopathy is very challenging.  Reviewed with the patient that nothing will make this get better quickly, and I suspect that will take on the order of a few months to resolve.  Classic eccentric's plus nitroglycerin.  Follow-up: 2 months  Meds ordered this encounter  Medications   nitroGLYCERIN (NITRODUR - DOSED IN MG/24 HR) 0.2 mg/hr patch    Sig: Apply 1/4 patch to affected area as directed by MD and change every 24 hours.    Dispense:  30 patch    Refill:  2   There are no discontinued medications. No orders of the defined types were placed in this encounter.   Signed,  Richard Galea. Berry Godsey, MD   Outpatient Encounter Medications as of 10/09/2022  Medication Sig   acetaminophen (TYLENOL) 500 MG tablet Take 1,000 mg by mouth daily as needed for headache.    albuterol (VENTOLIN HFA) 108 (90 Base) MCG/ACT inhaler INHALE TWO PUFFS INTO THE LUNGS EVERY 6  HOURS AS NEEDED   allopurinol (ZYLOPRIM) 100 MG tablet Take 1 tablet (100 mg total) by mouth daily. Daily for gout prevention   Cholecalciferol (VITAMIN D3) 125 MCG (5000 UT) CAPS Take by mouth.   co-enzyme Q-10 50 MG capsule Take 1 capsule (50 mg total) by mouth daily.   colchicine 0.6 MG tablet Take 1 tablet (0.6 mg total) by mouth daily as needed (gout flare). First dose may take 2 tablets (1.2mg )   doxazosin (CARDURA) 1 MG tablet Take 1 tablet (1 mg total) by mouth at bedtime.   fluticasone (FLONASE) 50 MCG/ACT nasal spray USE 2 SPRAYS INTO EACH NOSTRIL ONCE DAILY AS  NEEDED FOR CONGESTION   furosemide (LASIX) 20 MG tablet Take 0.5 tablets (10 mg total) by mouth daily as needed for edema (or shortness of breath).   indomethacin (INDOCIN) 25 MG capsule Take 1 capsule (25 mg total) by mouth 2 (two) times daily as needed (gout flare).   losartan (COZAAR) 50 MG tablet TAKE 1 TABLET BY MOUTH EVERY DAY   lovastatin (MEVACOR) 20 MG tablet Take 1 tablet (20 mg total) by mouth at bedtime. Daily for cholesterol   Misc Natural Products (OSTEO BI-FLEX ADV TRIPLE ST PO) Take by mouth.   naproxen (NAPROSYN) 500 MG tablet Take 1 tablet (500 mg total) by mouth 2 (two) times daily with a meal. For 1 week then as needed   Naproxen Sodium (ALEVE PO) Take 2 tablets by mouth as needed.   nitroGLYCERIN (NITRODUR - DOSED IN MG/24 HR) 0.2 mg/hr patch Apply 1/4 patch to affected area as directed by MD and change every 24 hours.   pantoprazole (PROTONIX) 40 MG tablet Take 1 tablet (40 mg total) by mouth daily. Daily for reflux   QUERCETIN PO Take by mouth.   sertraline (ZOLOFT) 100 MG tablet TAKE ONE-HALF TABLET BY MOUTH TWICE A DAY FOR PTSD   sildenafil (REVATIO) 20 MG tablet TAKE TWO TO FIVE TABLETS BY MOUTH ONCE DAILY AS NEEDED   vitamin C (ASCORBIC ACID) 500 MG tablet Take 500 mg by mouth daily.   Zinc 50 MG TABS Take by mouth.   No facility-administered encounter medications on file as of 10/09/2022.

## 2022-10-09 ENCOUNTER — Ambulatory Visit (INDEPENDENT_AMBULATORY_CARE_PROVIDER_SITE_OTHER): Payer: Medicare HMO | Admitting: Family Medicine

## 2022-10-09 ENCOUNTER — Encounter: Payer: Self-pay | Admitting: Family Medicine

## 2022-10-09 VITALS — BP 110/60 | HR 85 | Temp 98.1°F | Ht 67.0 in | Wt 222.4 lb

## 2022-10-09 DIAGNOSIS — M7661 Achilles tendinitis, right leg: Secondary | ICD-10-CM | POA: Diagnosis not present

## 2022-10-09 MED ORDER — NITROGLYCERIN 0.2 MG/HR TD PT24: MEDICATED_PATCH | TRANSDERMAL | 2 refills | Status: DC

## 2022-10-09 NOTE — Patient Instructions (Signed)
Achilles Tendon Rehab  Start easy.  It is ok if there is mild discomfort, but if there is pain, then back off how much rehab you are doing.  For achilles rehab, you basically just need to concentrate on the ankle going up and down.  Start: Calf raises while seated First lower and then raise on both feet Assist lifting with hands and then slowly lower  Begin with 3 sets of 10 repetitions Increase by 5 repetitions every 3 days  Goal is 3 sets of 30 repetitions  If feels good at 3 sets of 30 - add backpack with 5 lbs  Increase by 5 lbs per week to max of 30 lbs   Alternative: If you have weights at home, you can put a dumbbell upright on the knee of the effected heel.  Go up with assistance from your hands, and then lower slowly.  If this is easy, then change to calf lowers standing on a step.  A heel cup of any brand will elevate the heel and take stress off of the achilles tendon. I particularly like the brand called Tuli's heel cups.  They cup the back of the heel, too.  They can be hard to find unless you can order off of the computer like on [employer]. Any heel cup is ok, though, including ones you can find at any pharmacy  

## 2022-10-27 ENCOUNTER — Other Ambulatory Visit: Payer: Self-pay | Admitting: Family Medicine

## 2022-10-27 NOTE — Telephone Encounter (Signed)
Naproxen last filled 10-01-22 #30 Furosemide last written 06-18-22 #10 LAst OV with Dr Reece Agar 10-01-22 Next OV 02-13-23 [facility]

## 2022-12-08 ENCOUNTER — Other Ambulatory Visit: Payer: Self-pay | Admitting: Family Medicine

## 2022-12-10 DIAGNOSIS — H02834 Dermatochalasis of left upper eyelid: Secondary | ICD-10-CM | POA: Diagnosis not present

## 2022-12-10 DIAGNOSIS — H02835 Dermatochalasis of left lower eyelid: Secondary | ICD-10-CM | POA: Diagnosis not present

## 2022-12-10 DIAGNOSIS — H52203 Unspecified astigmatism, bilateral: Secondary | ICD-10-CM | POA: Diagnosis not present

## 2022-12-10 DIAGNOSIS — H5203 Hypermetropia, bilateral: Secondary | ICD-10-CM | POA: Diagnosis not present

## 2022-12-10 DIAGNOSIS — H2513 Age-related nuclear cataract, bilateral: Secondary | ICD-10-CM | POA: Diagnosis not present

## 2022-12-10 DIAGNOSIS — H02832 Dermatochalasis of right lower eyelid: Secondary | ICD-10-CM | POA: Diagnosis not present

## 2022-12-10 DIAGNOSIS — H02831 Dermatochalasis of right upper eyelid: Secondary | ICD-10-CM | POA: Diagnosis not present

## 2022-12-12 ENCOUNTER — Telehealth: Payer: Self-pay | Admitting: Family Medicine

## 2022-12-12 MED ORDER — SCOPOLAMINE 1 MG/3DAYS TD PT72: 1.0000 | MEDICATED_PATCH | TRANSDERMAL | 0 refills | Status: DC

## 2022-12-12 NOTE — Telephone Encounter (Signed)
Spoke with pt relaying Dr. Timoteo Expose message. Pt expresses his thanks and verbalizes understanding. States the cruise if for 7 days.

## 2022-12-12 NOTE — Telephone Encounter (Addendum)
Plz notify [facility] patch [facility] for motion sickness.  How long will the cruise be?  Caution with side effects of dry mouth, drowsiness, confusion, unsteadiness.

## 2022-12-12 NOTE — Addendum Note (Signed)
Addended by: Eustaquio Boyden on: 12/12/2022 02:48 PM   Modules accepted: Orders

## 2022-12-12 NOTE — Telephone Encounter (Signed)
Patient wife called in and stated that they are going on a cruise. She was wondering if Dr. Reece Agar could send in a patch for him for the cruise. They are leaving on August 2. Thank you!

## 2022-12-13 NOTE — Progress Notes (Unsigned)
    Galvin Aversa T. Ryder Chesmore, MD, CAQ Sports Medicine [facility] at [facility] [address] [place], [address]  Phone: [phone]  FAX: [phone]  NEPHI Pena - 77 y.o. male  MRN [id number]  Date of Birth: [birth year]  Date: 12/15/2022  PCP: Eustaquio Boyden, MD  Referral: Eustaquio Boyden, MD  No chief complaint on file.  Subjective:   [name] is a 77 y.o. very pleasant male patient with There is no height or weight on file to calculate BMI. who presents with the following:  He is a pleasant patient follow-up from Achilles tendinopathy, saw him in May for this.  He had previously been having symptoms for greater than a year, and he is even seeing Dr. Victorino Dike and done formal physical therapy.  I placed a heel lift in his shoe, and additionally also had him start Alfredson's protocol for Achilles tendinopathy.  He presents again today in follow-up.    Review of Systems is noted in the HPI, as appropriate  Objective:   There were no vitals taken for this visit.  GEN: No acute distress; alert,appropriate. PULM: Breathing comfortably in no respiratory distress PSYCH: Normally interactive.   Laboratory and Imaging Data:  Assessment and Plan:   ***

## 2022-12-15 ENCOUNTER — Telehealth: Payer: Self-pay

## 2022-12-15 ENCOUNTER — Other Ambulatory Visit (HOSPITAL_COMMUNITY): Payer: Self-pay

## 2022-12-15 ENCOUNTER — Ambulatory Visit (INDEPENDENT_AMBULATORY_CARE_PROVIDER_SITE_OTHER): Payer: Medicare HMO | Admitting: Family Medicine

## 2022-12-15 ENCOUNTER — Encounter: Payer: Self-pay | Admitting: Family Medicine

## 2022-12-15 VITALS — BP 112/68 | HR 83 | Temp 97.9°F | Ht 67.0 in | Wt 223.1 lb

## 2022-12-15 DIAGNOSIS — M7661 Achilles tendinitis, right leg: Secondary | ICD-10-CM

## 2022-12-15 MED ORDER — NITROGLYCERIN 0.2 MG/HR TD PT24: MEDICATED_PATCH | TRANSDERMAL | Status: DC

## 2022-12-15 NOTE — Telephone Encounter (Signed)
PA request has been Submitted. New Encounter created for follow up. For additional info see Pharmacy Prior Auth telephone encounter from 12/15/22.

## 2022-12-15 NOTE — Telephone Encounter (Signed)
Can we submit a PA for the Scopolamine patch.

## 2022-12-15 NOTE — Telephone Encounter (Signed)
Pharmacy Patient Advocate Encounter  Received notification from [facility] that Prior Authorization for Scopolamine 1MG /3DAYS 72 hr patches has been APPROVED from 05/26/22 to 05/26/23.Marland Kitchen  PA #/Case ID/Reference #: [id number]

## 2022-12-15 NOTE — Telephone Encounter (Signed)
Pharmacy Patient Advocate Encounter   Received notification from Pt Calls Messages that prior authorization for Scopolamine 1MG /3DAYS 72 hr patches is required/requested.   Insurance verification completed.   The patient is insured through [facility] .   Per test claim: PA submitted to [facility] via CoverMyMeds Key/confirmation #/EOC [id number] Status is pending

## 2022-12-24 ENCOUNTER — Other Ambulatory Visit: Payer: Self-pay | Admitting: Family Medicine

## 2022-12-24 NOTE — Telephone Encounter (Signed)
Naproxen Last filled:  10/29/22, #30 Last OV:  10/01/22, R ankle pain Next OV:  02/13/23, 6 mo f/u

## 2023-01-08 ENCOUNTER — Encounter (INDEPENDENT_AMBULATORY_CARE_PROVIDER_SITE_OTHER): Payer: Self-pay

## 2023-02-13 ENCOUNTER — Encounter: Payer: Self-pay | Admitting: Family Medicine

## 2023-02-13 ENCOUNTER — Ambulatory Visit (INDEPENDENT_AMBULATORY_CARE_PROVIDER_SITE_OTHER): Payer: Medicare HMO | Admitting: Family Medicine

## 2023-02-13 VITALS — BP 138/82 | HR 78 | Temp 97.8°F | Ht 67.0 in | Wt 214.6 lb

## 2023-02-13 DIAGNOSIS — M7661 Achilles tendinitis, right leg: Secondary | ICD-10-CM

## 2023-02-13 DIAGNOSIS — M1A0790 Idiopathic chronic gout, unspecified ankle and foot, without tophus (tophi): Secondary | ICD-10-CM

## 2023-02-13 NOTE — Assessment & Plan Note (Signed)
This is overall stable with latest urate 6 on low dose allopurinol daily.  No recent gout flares.

## 2023-02-13 NOTE — Patient Instructions (Addendum)
Return in 2-3 months for recheck.  Continue ankle support brace use with walking.  Continue 1/2 patch nitroglycerin protocol daily.

## 2023-02-13 NOTE — Addendum Note (Signed)
Addended by: Eustaquio Boyden on: 02/13/2023 11:49 AM   Modules accepted: Orders

## 2023-02-13 NOTE — Progress Notes (Addendum)
Ph: [phone] Fax: [phone]   Patient ID: [name], male    DOB: 03/17/1946, 77 y.o.   MRN: [id number]  This visit was conducted in person.  BP 138/82   Pulse 78   Temp 97.8 F (36.6 C) (Temporal)   Ht 5\' 7"  (1.702 m)   Wt 214 lb 9.6 oz (97.3 kg)   SpO2 94%   BMI 33.61 kg/m    CC: R heel f/u visit  Subjective:   HPI: [name] is a 77 y.o. male presenting on 02/13/2023 for Follow-up (Pt complains of right heel not getting better. Says its bad. Pt states he "walks thru pain" Occasionally will take OTC to help. Pt complains of wearing 1/2 nitroglycerin patch but only helps a little. )   See prior notes for details.  Chronic R achilles tendinopathy present for >1 year. Has seen Dr Patsy Lager sports medicine, Dr Shireen Quan and had formal physical therapy course (seemed to worsen symptoms). He's used heel lift to shoe as well as Alfredson's protocol for achilles tendinopathy. He's been using nitroglycerin patch protocol most recently increased to 1/2 patch daily. No significant benefit over the past 3 months. He notes ankle support brace (?ASO) is what is most beneficial - regularly using this.   He has changed routine - walks 2 miles. No longer using treadmill or elliptical.  He works through the pain.   H/o gout, last uric acid level stable at 6. He continues allopurinol 100mg  daily.  9 lb weight loss in the past 2 months - he's started a diet only eating 2 meals/day and not snacking in evenings.      Relevant past medical, surgical, family and social history reviewed and updated as indicated. Interim medical history since our last visit reviewed. Allergies and medications reviewed and updated. Outpatient Medications Prior to Visit  Medication Sig Dispense Refill   acetaminophen (TYLENOL) 500 MG tablet Take 1,000 mg by mouth daily as needed for headache.      albuterol (VENTOLIN HFA) 108 (90 Base) MCG/ACT inhaler INHALE TWO PUFFS INTO THE LUNGS  EVERY 6 HOURS AS NEEDED 18 each 3   allopurinol (ZYLOPRIM) 100 MG tablet Take 1 tablet (100 mg total) by mouth daily. Daily for gout prevention 90 tablet 4   cholecalciferol (VITAMIN D3) 25 MCG (1000 UNIT) tablet Take 1,000 Units by mouth daily.     co-enzyme Q-10 50 MG capsule Take 1 capsule (50 mg total) by mouth daily.     colchicine 0.6 MG tablet Take 1 tablet (0.6 mg total) by mouth daily as needed (gout flare). First dose may take 2 tablets (1.2mg ) 30 tablet 3   doxazosin (CARDURA) 1 MG tablet Take 1 tablet (1 mg total) by mouth at bedtime. 90 tablet 4   fluticasone (FLONASE) 50 MCG/ACT nasal spray USE 2 SPRAYS INTO EACH NOSTRIL ONCE DAILY AS NEEDED FOR CONGESTION 16 mL 9   indomethacin (INDOCIN) 25 MG capsule Take 1 capsule (25 mg total) by mouth 2 (two) times daily as needed (gout flare). 60 capsule 0   losartan (COZAAR) 50 MG tablet TAKE 1 TABLET BY MOUTH EVERY DAY 30 tablet 12   lovastatin (MEVACOR) 20 MG tablet Take 1 tablet (20 mg total) by mouth at bedtime. Daily for cholesterol 90 tablet 4   Misc Natural Products (OSTEO BI-FLEX ADV TRIPLE ST PO) Take by mouth.     naproxen (NAPROSYN) 500 MG tablet TAKE 1 TABLET BY MOUTH TWICE A DAY WITH A MEAL FOR  1 WEEK AS NEEDED 30 tablet 0   Naproxen Sodium (ALEVE PO) Take 2 tablets by mouth as needed.     nitroGLYCERIN (NITRODUR - DOSED IN MG/24 HR) 0.2 mg/hr patch Apply 1/2 patch to affected area as directed by MD and change every 24 hours.     pantoprazole (PROTONIX) 40 MG tablet Take 1 tablet (40 mg total) by mouth daily. Daily for reflux 90 tablet 4   QUERCETIN PO Take by mouth.     scopolamine (TRANSDERM-SCOP) 1 MG/3DAYS Place 1 patch (1.5 mg total) onto the skin every 3 (three) days. 10 patch 0   sertraline (ZOLOFT) 100 MG tablet TAKE ONE-HALF TABLET BY MOUTH TWICE A DAY FOR PTSD     sildenafil (REVATIO) 20 MG tablet TAKE TWO TO FIVE TABLETS BY MOUTH ONCE DAILY AS NEEDED 45 tablet 1   vitamin C (ASCORBIC ACID) 500 MG tablet Take 500 mg by  mouth daily.     Zinc 50 MG TABS Take by mouth.     furosemide (LASIX) 20 MG tablet TAKE 0.5 TABLETS (10 MG TOTAL) BY MOUTH DAILY AS NEEDED FOR EDEMA (OR SHORTNESS OF BREATH). (Patient not taking: Reported on 02/13/2023) 10 tablet 0   No facility-administered medications prior to visit.     Per HPI unless specifically indicated in ROS section below Review of Systems  Objective:  BP 138/82   Pulse 78   Temp 97.8 F (36.6 C) (Temporal)   Ht 5\' 7"  (1.702 m)   Wt 214 lb 9.6 oz (97.3 kg)   SpO2 94%   BMI 33.61 kg/m   Wt Readings from Last 3 Encounters:  02/13/23 214 lb 9.6 oz (97.3 kg)  12/15/22 223 lb 2 oz (101.2 kg)  10/09/22 222 lb 6 oz (100.9 kg)      Physical Exam Vitals and nursing note reviewed.  Constitutional:      Appearance: Normal appearance. He is not ill-appearing.  Musculoskeletal:        General: Swelling and tenderness present.     Right lower leg: Edema (tr) present.     Left lower leg: No edema.     Comments: Chronic tender swelling to R achilles tendon just proximal to insertion site into calc without redness or warmth  Skin:    General: Skin is warm and dry.     Findings: No erythema or rash.  Neurological:     Mental Status: He is alert.  Psychiatric:        Mood and Affect: Mood normal.        Behavior: Behavior normal.       Results for orders placed or performed in visit on 08/12/22  Cologuard  Result Value Ref Range   COLOGUARD Negative Negative  Urinalysis, Routine w reflex microscopic  Result Value Ref Range   Color, Urine YELLOW Yellow;Lt. Yellow;Straw;Dark Yellow;Amber;Green;Red;Brown   APPearance CLEAR Clear;Turbid;Slightly Cloudy;Cloudy   Specific Gravity, Urine 1.025 1.000 - 1.030   pH 6.0 5.0 - 8.0   Total Protein, Urine NEGATIVE Negative   Urine Glucose NEGATIVE Negative   Ketones, ur NEGATIVE Negative   Bilirubin Urine NEGATIVE Negative   Hgb urine dipstick NEGATIVE Negative   Urobilinogen, UA 0.2 0.0 - 1.0   Leukocytes,Ua  NEGATIVE Negative   Nitrite NEGATIVE Negative   WBC, UA 0-2/hpf 0-2/hpf   RBC / HPF 0-2/hpf 0-2/hpf   Squamous Epithelial / HPF Rare(0-4/hpf) Rare(0-4/hpf)   Ca Oxalate Crys, UA Presence of (A) None   Lab Results  Component Value Date  LABURIC 6.0 08/05/2022   Assessment & Plan:   Problem List Items Addressed This Visit     Chronic gout    This is overall stable with latest urate 6 on low dose allopurinol daily.  No recent gout flares.       Right Achilles tendinitis - Primary    Chronic R achilles tendonitis present over 1 year.  Overall the best it's been in last several months with nitroglycerin patch protocol however still activity limiting. He also notes regular use of ankle support brace may have been helpful.  Will continue all treatment to date, offered referral to sports medicine in [place] for second opinion vs further treatment options - he prefers to recheck with me in office in 2-3 month.  Placed in large ASO ankle brace today with benefit.         No orders of the defined types were placed in this encounter.   No orders of the defined types were placed in this encounter.   Patient Instructions  Return in 2-3 months for recheck.  Continue ankle support brace use with walking.  Continue 1/2 patch nitroglycerin protocol daily.   Follow up plan: Return in 3 months (on 05/15/2023), or if symptoms worsen or fail to improve, for follow up visit.  Eustaquio Boyden, MD

## 2023-02-13 NOTE — Assessment & Plan Note (Addendum)
Chronic R achilles tendonitis present over 1 year.  Overall the best it's been in last several months with nitroglycerin patch protocol however still activity limiting. He also notes regular use of ankle support brace may have been helpful.  Will continue all treatment to date, offered referral to sports medicine in [place] for second opinion vs further treatment options - he prefers to recheck with me in office in 2-3 month.  Placed in large ASO ankle brace today with benefit.

## 2023-03-10 ENCOUNTER — Ambulatory Visit (INDEPENDENT_AMBULATORY_CARE_PROVIDER_SITE_OTHER)
Admission: RE | Admit: 2023-03-10 | Discharge: 2023-03-10 | Disposition: A | Discharge status: Home / Self Care | Payer: Medicare HMO | Source: Ambulatory Visit | Attending: Family Medicine | Admitting: Family Medicine

## 2023-03-10 ENCOUNTER — Ambulatory Visit: Payer: Medicare HMO | Admitting: Family Medicine

## 2023-03-10 ENCOUNTER — Encounter: Payer: Self-pay | Admitting: Family Medicine

## 2023-03-10 VITALS — BP 164/84 | HR 73 | Temp 98.2°F | Ht 67.0 in | Wt 223.0 lb

## 2023-03-10 DIAGNOSIS — J22 Unspecified acute lower respiratory infection: Secondary | ICD-10-CM | POA: Diagnosis not present

## 2023-03-10 DIAGNOSIS — R058 Other specified cough: Secondary | ICD-10-CM | POA: Diagnosis not present

## 2023-03-10 DIAGNOSIS — R051 Acute cough: Secondary | ICD-10-CM

## 2023-03-10 DIAGNOSIS — I10 Essential (primary) hypertension: Secondary | ICD-10-CM

## 2023-03-10 DIAGNOSIS — R918 Other nonspecific abnormal finding of lung field: Secondary | ICD-10-CM | POA: Diagnosis not present

## 2023-03-10 MED ORDER — GUAIFENESIN-CODEINE 100-10 MG/5ML PO SYRP: 5.0000 mL | ORAL_SOLUTION | Freq: Three times a day (TID) | ORAL | 0 refills | Status: AC | PRN

## 2023-03-10 MED ORDER — AZITHROMYCIN 250 MG PO TABS: ORAL_TABLET | ORAL | 0 refills | Status: AC

## 2023-03-10 MED ORDER — NAPROXEN 500 MG PO TABS: 500.0000 mg | ORAL_TABLET | Freq: Two times a day (BID) | ORAL | 0 refills | Status: DC | PRN

## 2023-03-10 NOTE — Assessment & Plan Note (Signed)
BP markedly elevated today in setting of NSAID use and acute illness. I asked him to start monitoring bp at home, let me know if consistently >140/90 to titrate antihypertensives accordingly.

## 2023-03-10 NOTE — Assessment & Plan Note (Addendum)
Suspect acute bronchitis complicated by abdominal wall strain from vigorous cough.  Will cover for atypical infection with azithromycin antibiotic course.  Rx cheratussin codeine cough syrup for cough suppression.

## 2023-03-10 NOTE — Progress Notes (Signed)
Ph: [phone] Fax: [phone]   Patient ID: [name], male    DOB: Jan 23, 1946, 77 y.o.   MRN: [id number]  This visit was conducted in person.  BP (!) 164/84 (BP Location: Right Arm, Cuff Size: Large)   Pulse 73   Temp 98.2 F (36.8 C) (Oral)   Ht 5\' 7"  (1.702 m)   Wt 223 lb (101.2 kg)   SpO2 95%   BMI 34.93 kg/m    BP Readings from Last 3 Encounters:  03/10/23 (!) 164/84  02/13/23 138/82  12/15/22 112/68   Chief Complaint  Patient presents with   Cough    C/o cough and head/chest congestion. Also R side pain after a coughing episode. Pain occurs when coughing, sneezing and/or taking a deep breath. Sxs started 03/05/23. Ibuprofen helps. Pt accompanied by wife, [name]. Requests refill of naproxen 500 mg.    Subjective:   Discussed the use of AI scribe software for clinical note transcription with the patient, who gave verbal consent to proceed.  History of Present Illness   [name], a 78 year old with a history of hypertension, presents with a cough, head congestion, chest congestion, and right-sided abdominal pain that started on October 10th after a coughing episode. The pain is located in the right lower posterio abdomen and radiates to the front. The pain can be severe, rating it as an 8 out of 10, worse with cough or deep breath. He has been managing the pain with 800mg  of ibuprofen, which provides significant relief.  He reports that the symptoms started after mowing the lawn and raking leaves on a windy day. He has a history of allergic rhinitis and had recently experienced eye matting, which was managed with Benadryl. He has been coughing up white mucus and has been taking Mucinex for relief. He denies any fever, ear pain, tooth pain, or sore throat. He reports increased shortness of breath when lying down and feels more winded when moving around. No increased need for more pillows at night.   He has noticed some swelling in his feet, which he attributes  to sitting up in a chair for extended periods. He has been taking half a pill of Lasix daily for this. He denies any changes in bowel habits, nausea, or vomiting. He has noticed an increase in weight, gaining nine pounds since his last visit, which he attributes to a recent cruise.          Relevant past medical, surgical, family and social history reviewed and updated as indicated. Interim medical history since our last visit reviewed. Allergies and medications reviewed and updated. Outpatient Medications Prior to Visit  Medication Sig Dispense Refill   acetaminophen (TYLENOL) 500 MG tablet Take 1,000 mg by mouth daily as needed for headache.      albuterol (VENTOLIN HFA) 108 (90 Base) MCG/ACT inhaler INHALE TWO PUFFS INTO THE LUNGS EVERY 6 HOURS AS NEEDED 18 each 3   allopurinol (ZYLOPRIM) 100 MG tablet Take 1 tablet (100 mg total) by mouth daily. Daily for gout prevention 90 tablet 4   cholecalciferol (VITAMIN D3) 25 MCG (1000 UNIT) tablet Take 1,000 Units by mouth daily.     co-enzyme Q-10 50 MG capsule Take 1 capsule (50 mg total) by mouth daily.     colchicine 0.6 MG tablet Take 1 tablet (0.6 mg total) by mouth daily as needed (gout flare). First dose may take 2 tablets (1.2mg ) 30 tablet 3   doxazosin (CARDURA) 1 MG tablet Take 1 tablet (  1 mg total) by mouth at bedtime. 90 tablet 4   fluticasone (FLONASE) 50 MCG/ACT nasal spray USE 2 SPRAYS INTO EACH NOSTRIL ONCE DAILY AS NEEDED FOR CONGESTION 16 mL 9   furosemide (LASIX) 20 MG tablet TAKE 0.5 TABLETS (10 MG TOTAL) BY MOUTH DAILY AS NEEDED FOR EDEMA (OR SHORTNESS OF BREATH). 10 tablet 0   indomethacin (INDOCIN) 25 MG capsule Take 1 capsule (25 mg total) by mouth 2 (two) times daily as needed (gout flare). 60 capsule 0   losartan (COZAAR) 50 MG tablet TAKE 1 TABLET BY MOUTH EVERY DAY 30 tablet 12   lovastatin (MEVACOR) 20 MG tablet Take 1 tablet (20 mg total) by mouth at bedtime. Daily for cholesterol 90 tablet 4   Misc Natural Products  (OSTEO BI-FLEX ADV TRIPLE ST PO) Take by mouth.     nitroGLYCERIN (NITRODUR - DOSED IN MG/24 HR) 0.2 mg/hr patch Apply 1/2 patch to affected area as directed by MD and change every 24 hours.     pantoprazole (PROTONIX) 40 MG tablet Take 1 tablet (40 mg total) by mouth daily. Daily for reflux 90 tablet 4   QUERCETIN PO Take by mouth.     scopolamine (TRANSDERM-SCOP) 1 MG/3DAYS Place 1 patch (1.5 mg total) onto the skin every 3 (three) days. 10 patch 0   sertraline (ZOLOFT) 100 MG tablet TAKE ONE-HALF TABLET BY MOUTH TWICE A DAY FOR PTSD     sildenafil (REVATIO) 20 MG tablet TAKE TWO TO FIVE TABLETS BY MOUTH ONCE DAILY AS NEEDED 45 tablet 1   vitamin C (ASCORBIC ACID) 500 MG tablet Take 500 mg by mouth daily.     Zinc 50 MG TABS Take by mouth.     naproxen (NAPROSYN) 500 MG tablet TAKE 1 TABLET BY MOUTH TWICE A DAY WITH A MEAL FOR 1 WEEK AS NEEDED 30 tablet 0   Naproxen Sodium (ALEVE PO) Take 2 tablets by mouth as needed.     No facility-administered medications prior to visit.     Per HPI unless specifically indicated in ROS section below Review of Systems  Objective:  BP (!) 164/84 (BP Location: Right Arm, Cuff Size: Large)   Pulse 73   Temp 98.2 F (36.8 C) (Oral)   Ht 5\' 7"  (1.702 m)   Wt 223 lb (101.2 kg)   SpO2 95%   BMI 34.93 kg/m   Wt Readings from Last 3 Encounters:  03/10/23 223 lb (101.2 kg)  02/13/23 214 lb 9.6 oz (97.3 kg)  12/15/22 223 lb 2 oz (101.2 kg)      Physical Exam   HEENT: Tympanic membranes clear, pearly gray, canal clear. No maxillary or frontal sinus pressure to percussion. Wearing mask. No lymphadenopathy, cervical or otherwise appreciated. CHEST: Decreased breath sounds in bilateral lower lobes, left greater than right. No crackles or wheezing appreciated.  CARDIOVASCULAR: Normal S1, S2, rhythm and rate with two out of six systolic murmur best heard at the upper sternal border. ABDOMEN: Right lateral lower abdomen and right upper quadrant tenderness to  palpation. Normoactive bowel sounds, no rebound.  SKIN: No rash, there is bruising overlying right lateral lower abdomen.              Assessment & Plan:      Right Lower Abdominal Pain Severe pain in the right lower abdomen and flank, likely due to muscle strain from coughing. Noted bruising in the area, thought related to heating pad use. No signs of urinary symptoms suggestive of renal colic, no  GI symptoms such as bowel changes, nausea/vomiting or blood in stool. -Advise to limit heating pad use to 15-20 minutes at a time, with a towel between the skin and the pad. -Rx naprosyn PRN pain -update if not improving with treatment to consider abdominal imaging ie non-contrasted CT  Acute Bronchitis Recent onset of cough, chest congestion, and sputum production following yard work in windy conditions. No fever, sore throat, or ear pain. Decreased breath sounds in bilateral lower lobes on examination. -Order chest x-ray to rule out pneumonia or other lower respiratory tract conditions. -Continue Mucinex as needed for cough and congestion.  Hypertension Elevated blood pressure at today's visit despite regular use of Cardura 1mg  daily and Losartan 50mg  daily. Possible contribution from recent ibuprofen use for abdominal pain. -Advise to limit ibuprofen use due to potential for increasing blood pressure. -I asked him to increase lasix 20mg  use to 1 whole tablet daily x3 days then return to regular dose 1/2 tab daily.   -Start monitoring bp at home, let me know if consistently >140/90 to titrate antihypertensives accordingly.      Problem List Items Addressed This Visit     Hypertension    BP markedly elevated today in setting of NSAID use and acute illness. I asked him to start monitoring bp at home, let me know if consistently >140/90 to titrate antihypertensives accordingly.       Acute respiratory infection    Suspect acute bronchitis complicated by abdominal wall strain from vigorous  cough.  Will cover for atypical infection with azithromycin antibiotic course.  Rx cheratussin codeine cough syrup for cough suppression.       Relevant Medications   azithromycin (ZITHROMAX) 250 MG tablet   Other Visit Diagnoses     Acute cough    -  Primary   Relevant Orders   DG Chest 2 View (Completed)        Meds ordered this encounter  Medications   naproxen (NAPROSYN) 500 MG tablet    Sig: Take 1 tablet (500 mg total) by mouth 2 (two) times daily as needed for moderate pain (pain score 4-6) (take with food).    Dispense:  30 tablet    Refill:  0   azithromycin (ZITHROMAX) 250 MG tablet    Sig: Take 2 tablets on day 1, then 1 tablet daily on days 2 through 5    Dispense:  6 tablet    Refill:  0   guaiFENesin-codeine (ROBITUSSIN AC) 100-10 MG/5ML syrup    Sig: Take 5 mLs by mouth 3 (three) times daily as needed for cough.    Dispense:  120 mL    Refill:  0    Orders Placed This Encounter  Procedures   DG Chest 2 View    Standing Status:   Future    Number of Occurrences:   1    Standing Expiration Date:   03/09/2024    Order Specific Question:   Reason for Exam (SYMPTOM  OR DIAGNOSIS REQUIRED)    Answer:   productive cough with decreased breath sounds    Order Specific Question:   Preferred imaging location?    Answer:   [facility]    Patient Instructions  Xray today  Start antibiotic azithromycin sent to pharmacy.  BP was too high today - start monitoring blood pressures at home, let me know if consistently >140/90   VISIT SUMMARY: Dear Mr. Dickerman, during your visit, we discussed your recent symptoms of cough, chest congestion, and  right-sided abdominal pain. We believe the pain may be due to muscle strain from coughing, and your cough and congestion may be due to acute bronchitis. We also noted that your blood pressure was elevated, which could be due to your recent use of ibuprofen for pain relief.  YOUR PLAN: -RIGHT LOWER ABDOMINAL PAIN: Your  severe pain in the right lower abdomen and flank is likely due to muscle strain from coughing. Please limit your use of the heating pad to 15-20 minutes at a time, with a towel between the skin and the pad. You may take naprosyn for pain. If not improving with treatment, let us know and I will order abdominal imaging.   -ACUTE BRONCHITIS: Your recent onset of cough, chest congestion, and sputum production may be due to acute bronchitis. We will order a chest x-ray to rule out pneumonia or other lower respiratory tract conditions. Please continue taking Mucinex as needed for cough and congestion. I have sent azithromycin antibiotic to your pharmacy to cover atypical bronchitis infection.   -HYPERTENSION: Your blood pressure was elevated during your visit. This could be due to your recent use of ibuprofen for pain relief. Limit your use of ibuprofen due to its potential for increasing blood pressure. Start monitoring blood pressures at home and let me know if consistently >140/90 to titrate blood pressure medicines.  INSTRUCTIONS: Please continue your regular medications including Cardura, Losartan, and Lasix.  I'd like you to start antibiotic azithromycin. You may also use codeine cough syrup which I've sent to your pharmacy   Follow up plan: No follow-ups on file.  Eustaquio Boyden, MD

## 2023-03-10 NOTE — Patient Instructions (Addendum)
Xray today  Start antibiotic azithromycin sent to pharmacy.  BP was too high today - start monitoring blood pressures at home, let me know if consistently >140/90   VISIT SUMMARY: Dear [name], during your visit, we discussed your recent symptoms of cough, chest congestion, and right-sided abdominal pain. We believe the pain may be due to muscle strain from coughing, and your cough and congestion may be due to acute bronchitis. We also noted that your blood pressure was elevated, which could be due to your recent use of ibuprofen for pain relief.  YOUR PLAN: -RIGHT LOWER ABDOMINAL PAIN: Your severe pain in the right lower abdomen and flank is likely due to muscle strain from coughing. Please limit your use of the heating pad to 15-20 minutes at a time, with a towel between the skin and the pad. You may take naprosyn for pain. If not improving with treatment, let us know and I will order abdominal imaging.   -ACUTE BRONCHITIS: Your recent onset of cough, chest congestion, and sputum production may be due to acute bronchitis. We will order a chest x-ray to rule out pneumonia or other lower respiratory tract conditions. Please continue taking Mucinex as needed for cough and congestion. I have sent azithromycin antibiotic to your pharmacy to cover atypical bronchitis infection.   -HYPERTENSION: Your blood pressure was elevated during your visit. This could be due to your recent use of ibuprofen for pain relief. Limit your use of ibuprofen due to its potential for increasing blood pressure. Start monitoring blood pressures at home and let me know if consistently >140/90 to titrate blood pressure medicines.  INSTRUCTIONS: Please continue your regular medications including Cardura, Losartan, and Lasix.  I'd like you to start antibiotic azithromycin. You may also use codeine cough syrup which I've sent to your pharmacy

## 2023-03-11 ENCOUNTER — Other Ambulatory Visit: Payer: Self-pay | Admitting: Family Medicine

## 2023-03-18 ENCOUNTER — Telehealth: Payer: Self-pay | Admitting: Family Medicine

## 2023-03-18 NOTE — Telephone Encounter (Signed)
Patient wife called in and stated that patient is still dealing with a cough. She stated that they were informed to call in with an update, She was wanting to know if he needs another antibiotic. Please advise. Thank you!

## 2023-03-19 MED ORDER — BENZONATATE 100 MG PO CAPS: 100.0000 mg | ORAL_CAPSULE | Freq: Three times a day (TID) | ORAL | 0 refills | Status: DC | PRN

## 2023-03-19 NOTE — Telephone Encounter (Signed)
Spoke with pt asking about ongoing cough. States he got better after finishing previous round of med, but cough- w/ clear mucous returned about 2 days after/ Denies any fever. I relayed Dr Timoteo Expose message about Tessalon perles and cough syrup. Pt verbalizes understanding and will pick up rx. States he will let us know if perles don't help. Fyi to Dr Reece Agar.

## 2023-03-19 NOTE — Telephone Encounter (Addendum)
Is cough productive or associated with fever? If so, do recommend antibiotic treatment with augmentin 875 BID 10d course.  If not, would offer codeine cough syrup or tessalon perls to try for cough first. I went ahead and sent in some tessalon perls for him to start with.

## 2023-03-19 NOTE — Telephone Encounter (Signed)
Noted  

## 2023-03-19 NOTE — Addendum Note (Signed)
Addended by: Eustaquio Boyden on: 03/19/2023 09:41 AM   Modules accepted: Orders

## 2023-04-15 ENCOUNTER — Other Ambulatory Visit: Payer: Self-pay | Admitting: Family Medicine

## 2023-04-15 NOTE — Telephone Encounter (Signed)
Last office visit 03/10/2023 for acute cough.  Last refilled 12/15/22 by Dr. Patsy Lager.  Next Appt: 05/15/2023 with Dr. Reece Agar to follow up achilles tendonitis.  Ok to refill?

## 2023-04-16 NOTE — Telephone Encounter (Signed)
ERx Check with patient - does he need ongoing nitroglycerin patches? If so, he cannot take generic viagra (sildenafil) when he has a patch on.

## 2023-04-16 NOTE — Telephone Encounter (Signed)
Spoke with pt about nitroglycerin patch. States he wears it daily. I relayed Dr Timoteo Expose message. Pt verbalizes understanding and states he was made aware to no use Viagra while wearing patch previously. But he expresses his thanks.

## 2023-05-07 LAB — CBC AND DIFFERENTIAL
Hemoglobin: 14.6 (ref 13.5–17.5)
Platelets: 301 10*3/uL (ref 150–400)
WBC: 7.1

## 2023-05-07 LAB — HEPATIC FUNCTION PANEL
ALT: 43 U/L — AB (ref 10–40)
AST: 38 (ref 14–40)
Alkaline Phosphatase: 111 (ref 25–125)
Bilirubin, Direct: 0.2
Bilirubin, Total: 0.5

## 2023-05-07 LAB — VITAMIN D 25 HYDROXY (VIT D DEFICIENCY, FRACTURES): Vit D, 25-Hydroxy: 36.8

## 2023-05-07 LAB — BASIC METABOLIC PANEL
BUN: 16 (ref 4–21)
Creatinine: 1.1 (ref 0.6–1.3)
EGFR: 69
Glucose: 107

## 2023-05-07 LAB — HEMOGLOBIN A1C: Hemoglobin A1C: 5.7

## 2023-05-07 LAB — LIPID PANEL
Cholesterol: 162 (ref 0–200)
HDL: 50 (ref 35–70)
LDL Cholesterol: 96
Triglycerides: 195 — AB (ref 40–160)

## 2023-05-07 LAB — TSH: TSH: 2.98 (ref 0.41–5.90)

## 2023-05-15 ENCOUNTER — Ambulatory Visit (INDEPENDENT_AMBULATORY_CARE_PROVIDER_SITE_OTHER): Payer: Medicare HMO | Admitting: Family Medicine

## 2023-05-15 ENCOUNTER — Ambulatory Visit
Admission: RE | Admit: 2023-05-15 | Discharge: 2023-05-15 | Disposition: A | Discharge status: Home / Self Care | Payer: Medicare HMO | Source: Ambulatory Visit | Attending: Family Medicine | Admitting: Family Medicine

## 2023-05-15 ENCOUNTER — Encounter: Payer: Self-pay | Admitting: Family Medicine

## 2023-05-15 VITALS — BP 148/80 | HR 65 | Temp 97.5°F | Ht 67.0 in | Wt 225.4 lb

## 2023-05-15 DIAGNOSIS — M79671 Pain in right foot: Secondary | ICD-10-CM

## 2023-05-15 DIAGNOSIS — M5441 Lumbago with sciatica, right side: Secondary | ICD-10-CM | POA: Diagnosis not present

## 2023-05-15 DIAGNOSIS — I10 Essential (primary) hypertension: Secondary | ICD-10-CM

## 2023-05-15 DIAGNOSIS — M799 Soft tissue disorder, unspecified: Secondary | ICD-10-CM | POA: Diagnosis not present

## 2023-05-15 DIAGNOSIS — B001 Herpesviral vesicular dermatitis: Secondary | ICD-10-CM | POA: Diagnosis not present

## 2023-05-15 DIAGNOSIS — M19071 Primary osteoarthritis, right ankle and foot: Secondary | ICD-10-CM | POA: Diagnosis not present

## 2023-05-15 DIAGNOSIS — G8929 Other chronic pain: Secondary | ICD-10-CM | POA: Diagnosis not present

## 2023-05-15 MED ORDER — VALACYCLOVIR HCL 1 G PO TABS
2000.0000 mg | ORAL_TABLET | Freq: Two times a day (BID) | ORAL | 1 refills | Status: DC
Start: 2023-05-15 — End: 2023-09-02

## 2023-05-15 NOTE — Patient Instructions (Addendum)
For fever blister, may try valtrex sent to pharmacy take twice daily for 1 day per flare. BP is staying a bit elevated - continue losartan 50mg , doxazosin 1mg  nightly, and furosemide water pill 20mg  1/2 pill as needed (shortness of breath, leg swelling - take in am).  Start monitoring blood pressures at home, let me know if consistently >140/90 to discuss med changes.  For achilles - xray of heel today. Pending results may refer you for another opinion.

## 2023-05-15 NOTE — Progress Notes (Unsigned)
Ph: [phone] Fax: [phone]   Patient ID: [name], male    DOB: 1946/04/25, 77 y.o.   MRN: [id number]  This visit was conducted in person.  BP (!) 148/80 (BP Location: Right Arm, Cuff Size: Large)   Pulse 65   Temp (!) 97.5 F (36.4 C) (Oral)   Ht 5\' 7"  (1.702 m)   Wt 225 lb 6 oz (102.2 kg)   SpO2 97%   BMI 35.30 kg/m   BP Readings from Last 3 Encounters:  05/15/23 (!) 148/80  03/10/23 (!) 164/84  02/13/23 138/82    CC: check heel  Subjective:   HPI: [name] is a 77 y.o. male presenting on 05/15/2023 for Medical Management of Chronic Issues (Here for 3 mo R heel pain. Pt accompanied by wife, [name]. )   Brings labs from the [place] (05/07/2023) which were asked to be abstracted and scanned.   See prior note for details.  Chronic R achilles tendinopathy present for >1 year, has seen sports medicine [name] as well as [facility] Dr [name] and formal physical therapy (worsened symptoms). He's tried heel lift to shoe as well as achilles tendinopathy topical nitroglycerin patch protocol (1/2 patch/day). ASO brace has been helpful.   He's had to change his routine - walks 2 miles, no longer using treadmill or elliptical. He works through the pain.  Wearing brace helps but any physical activity exacerbates pain.  Now ASO brace may be causing leg swelling.  Started using horse liniment ointment and wife massages it - this feels better.  Horse liniment seems to work better than Voltaren gel.   Now started to have R upper buttock pain wonders if due to favoring that R leg (limps on ankle). Pain can travel to anterior thigh. No pain with sitting or supine, pain with standing and walking.   H/o gout managed with allopurinol 100mg  daily.  Lab Results  Component Value Date   LABURIC 6.0 08/05/2022    HTN - Compliant with current antihypertensive regimen of doxazosin 1mg  nighlty, lasix 10mg  daily PRN swelling, losartan 50mg  daily. Does not check blood  pressures at home. No low blood pressure readings or symptoms of dizziness/syncope. Denies HA, vision changes, CP/tightness, SOB. Notes some leg swelling.    Currently with fever blister managing with abreva. Has never tried oral antiviral      Relevant past medical, surgical, family and social history reviewed and updated as indicated. Interim medical history since our last visit reviewed. Allergies and medications reviewed and updated. Outpatient Medications Prior to Visit  Medication Sig Dispense Refill   acetaminophen (TYLENOL) 500 MG tablet Take 1,000 mg by mouth daily as needed for headache.      albuterol (VENTOLIN HFA) 108 (90 Base) MCG/ACT inhaler INHALE TWO PUFFS INTO THE LUNGS EVERY 6 HOURS AS NEEDED 18 each 3   allopurinol (ZYLOPRIM) 100 MG tablet Take 1 tablet (100 mg total) by mouth daily. Daily for gout prevention 90 tablet 4   benzonatate (TESSALON) 100 MG capsule Take 1 capsule (100 mg total) by mouth 3 (three) times daily as needed for cough. 30 capsule 0   cholecalciferol (VITAMIN D3) 25 MCG (1000 UNIT) tablet Take 1,000 Units by mouth daily.     co-enzyme Q-10 50 MG capsule Take 1 capsule (50 mg total) by mouth daily.     colchicine 0.6 MG tablet Take 1 tablet (0.6 mg total) by mouth daily as needed (gout flare). First dose may take 2 tablets (1.2mg ) 30 tablet  3   doxazosin (CARDURA) 1 MG tablet Take 1 tablet (1 mg total) by mouth at bedtime. 90 tablet 4   fluticasone (FLONASE) 50 MCG/ACT nasal spray USE 2 SPRAYS INTO EACH NOSTRIL ONCE DAILY AS NEEDED FOR CONGESTION 16 mL 9   furosemide (LASIX) 20 MG tablet TAKE 0.5 TABLETS (10 MG TOTAL) BY MOUTH DAILY AS NEEDED FOR EDEMA (OR SHORTNESS OF BREATH). 10 tablet 0   guaiFENesin-codeine (ROBITUSSIN AC) 100-10 MG/5ML syrup Take 5 mLs by mouth 3 (three) times daily as needed for cough. 120 mL 0   indomethacin (INDOCIN) 25 MG capsule Take 1 capsule (25 mg total) by mouth 2 (two) times daily as needed (gout flare). 60 capsule 0    losartan (COZAAR) 50 MG tablet TAKE 1 TABLET BY MOUTH EVERY DAY 30 tablet 12   lovastatin (MEVACOR) 20 MG tablet Take 1 tablet (20 mg total) by mouth at bedtime. Daily for cholesterol 90 tablet 4   Misc Natural Products (OSTEO BI-FLEX ADV TRIPLE ST PO) Take by mouth.     naproxen (NAPROSYN) 500 MG tablet Take 1 tablet (500 mg total) by mouth 2 (two) times daily as needed for moderate pain (pain score 4-6) (take with food). 30 tablet 0   nitroGLYCERIN (NITRODUR - DOSED IN MG/24 HR) 0.2 mg/hr patch APPLY 1/4 PATCH TO AFFECTED AREA AS DIRECTED BY MD AND CHANGE EVERY 24 HOURS. 30 patch 0   pantoprazole (PROTONIX) 40 MG tablet Take 1 tablet (40 mg total) by mouth daily. Daily for reflux 90 tablet 4   QUERCETIN PO Take by mouth.     scopolamine (TRANSDERM-SCOP) 1 MG/3DAYS Place 1 patch (1.5 mg total) onto the skin every 3 (three) days. 10 patch 0   sertraline (ZOLOFT) 100 MG tablet TAKE ONE-HALF TABLET BY MOUTH TWICE A DAY FOR PTSD     sildenafil (REVATIO) 20 MG tablet TAKE TWO TO FIVE TABLETS BY MOUTH ONCE DAILY AS NEEDED 45 tablet 1   vitamin C (ASCORBIC ACID) 500 MG tablet Take 500 mg by mouth daily.     Zinc 50 MG TABS Take by mouth.     No facility-administered medications prior to visit.     Per HPI unless specifically indicated in ROS section below Review of Systems  Objective:  BP (!) 148/80 (BP Location: Right Arm, Cuff Size: Large)   Pulse 65   Temp (!) 97.5 F (36.4 C) (Oral)   Ht 5\' 7"  (1.702 m)   Wt 225 lb 6 oz (102.2 kg)   SpO2 97%   BMI 35.30 kg/m   Wt Readings from Last 3 Encounters:  05/15/23 225 lb 6 oz (102.2 kg)  03/10/23 223 lb (101.2 kg)  02/13/23 214 lb 9.6 oz (97.3 kg)      Physical Exam Vitals and nursing note reviewed.  Constitutional:      Appearance: Normal appearance. He is not ill-appearing.  HENT:     Mouth/Throat:     Comments: Fever blister to R lower lip Eyes:     Extraocular Movements: Extraocular movements intact.     Pupils: Pupils are equal,  round, and reactive to light.  Neck:     Thyroid: No thyroid mass or thyromegaly.  Cardiovascular:     Rate and Rhythm: Normal rate and regular rhythm.     Pulses: Normal pulses.     Heart sounds: Normal heart sounds. No murmur heard. Pulmonary:     Effort: Pulmonary effort is normal. No respiratory distress.     Breath sounds: Normal  breath sounds. No wheezing, rhonchi or rales.  Musculoskeletal:     Cervical back: Normal range of motion and neck supple. No rigidity.     Right lower leg: No edema.     Left lower leg: No edema.  Lymphadenopathy:     Cervical: No cervical adenopathy.  Skin:    General: Skin is warm and dry.     Findings: No rash.  Neurological:     Mental Status: He is alert.  Psychiatric:        Mood and Affect: Mood normal.        Behavior: Behavior normal.       Results for orders placed or performed in visit on 08/12/22  Urinalysis, Routine w reflex microscopic   Collection Time: 08/12/22  3:48 PM  Result Value Ref Range   Color, Urine YELLOW Yellow;Lt. Yellow;Straw;Dark Yellow;Amber;Green;Red;Brown   APPearance CLEAR Clear;Turbid;Slightly Cloudy;Cloudy   Specific Gravity, Urine 1.025 1.000 - 1.030   pH 6.0 5.0 - 8.0   Total Protein, Urine NEGATIVE Negative   Urine Glucose NEGATIVE Negative   Ketones, ur NEGATIVE Negative   Bilirubin Urine NEGATIVE Negative   Hgb urine dipstick NEGATIVE Negative   Urobilinogen, UA 0.2 0.0 - 1.0   Leukocytes,Ua NEGATIVE Negative   Nitrite NEGATIVE Negative   WBC, UA 0-2/hpf 0-2/hpf   RBC / HPF 0-2/hpf 0-2/hpf   Squamous Epithelial / HPF Rare(0-4/hpf) Rare(0-4/hpf)   Ca Oxalate Crys, UA Presence of (A) None  Cologuard   Collection Time: 08/18/22  9:17 AM  Result Value Ref Range   COLOGUARD Negative Negative    Assessment & Plan:   Problem List Items Addressed This Visit   None Visit Diagnoses       Chronic heel pain, right    -  Primary   Relevant Orders   DG Foot Complete Right        Meds ordered  this encounter  Medications   valACYclovir (VALTREX) 1000 MG tablet    Sig: Take 2 tablets (2,000 mg total) by mouth 2 (two) times daily. For one day per flare    Dispense:  20 tablet    Refill:  1    Orders Placed This Encounter  Procedures   DG Foot Complete Right    Standing Status:   Future    Number of Occurrences:   1    Expiration Date:   05/14/2024    Reason for Exam (SYMPTOM  OR DIAGNOSIS REQUIRED):   chronic heel pain, achilles tendinopathy    Preferred imaging location?:   [facility]    Patient Instructions  For fever blister, may try valtrex sent to pharmacy take twice daily for 1 day per flare. BP is staying a bit elevated - continue losartan 50mg , doxazosin 1mg  nightly, and furosemide water pill 20mg  1/2 pill as needed (shortness of breath, leg swelling - take in am).  Start monitoring blood pressures at home, let me know if consistently >140/90 to discuss med changes.  For achilles - xray of heel today. Pending results may refer you for another opinion.   Follow up plan: Return if symptoms worsen or fail to improve.  Eustaquio Boyden, MD

## 2023-05-16 ENCOUNTER — Encounter: Payer: Self-pay | Admitting: Family Medicine

## 2023-05-16 DIAGNOSIS — M5441 Lumbago with sciatica, right side: Secondary | ICD-10-CM | POA: Insufficient documentation

## 2023-05-16 DIAGNOSIS — G8929 Other chronic pain: Secondary | ICD-10-CM | POA: Insufficient documentation

## 2023-05-16 DIAGNOSIS — B001 Herpesviral vesicular dermatitis: Secondary | ICD-10-CM | POA: Insufficient documentation

## 2023-05-16 NOTE — Assessment & Plan Note (Signed)
Managed with topical abrea to date.  Will Rx valtrex 2gm BID 1d course per flare. Reviewed side effects to watch for namely nausea, HA.

## 2023-05-16 NOTE — Assessment & Plan Note (Signed)
Discussed weight gain noted.  

## 2023-05-16 NOTE — Assessment & Plan Note (Addendum)
BP markedly elevated on presentation, does improve on recheck but still a bit high.  He regularly takes losartan 50mg  daily and doxazosin 1mg  nightly, with lasix 1/2 tab daily - has been taking at night, will change to am dosing and use PRN. I asked him to start monitoring BP more closely at home and let me know if consistently >150/90 for med titration. He recently saw the [place], was prescribed ACEI lisinopril 40mg  however he's already on ARB losartan 50mg  and has h/o intolerance to lisinopril (cough) so did not start this.

## 2023-05-16 NOTE — Assessment & Plan Note (Addendum)
He is a very active 77 yo. R heel pain is a chronic issue. Has seen multiple specialists, treated for R achilles tendinopathy, tried and failed PT. Pain is overall improved from initial presentation but still significantly limits activity and ability to be as active as he would like.  He continues using nitroglycerin patch daily since 09/2022, 1/2 patch since 11/2022.  Will check baseline heel films to r/o calcaneal injury. He requests another opinion - pending imaging results will refer to sports med vs ortho in [place].

## 2023-05-16 NOTE — Assessment & Plan Note (Signed)
Now with new R upper buttock pain with radiation to R thigh concern for eveloping sciatica in h/o longstanding favoring R leg due to chronic achilles pain.  See below.

## 2023-05-18 ENCOUNTER — Encounter: Payer: Self-pay | Admitting: Family Medicine

## 2023-05-21 ENCOUNTER — Other Ambulatory Visit: Payer: Self-pay | Admitting: Family Medicine

## 2023-05-21 DIAGNOSIS — I10 Essential (primary) hypertension: Secondary | ICD-10-CM

## 2023-05-21 NOTE — Telephone Encounter (Signed)
Furosemide last filled:  03/13/23, #10 Naproxen last filled:  03/22/23, #30 Scopolamine last filled:  12/15/22, #10 patch Last OV:  03/05/23, chronic R heel pain f/u Next OV:  none

## 2023-05-24 NOTE — Telephone Encounter (Signed)
ERx 

## 2023-07-04 ENCOUNTER — Other Ambulatory Visit: Payer: Self-pay | Admitting: Family Medicine

## 2023-07-04 DIAGNOSIS — I10 Essential (primary) hypertension: Secondary | ICD-10-CM

## 2023-07-06 NOTE — Telephone Encounter (Signed)
 Spoke to pt, scheduled cpe for 09/02/23

## 2023-07-06 NOTE — Telephone Encounter (Signed)
 E-scribed refill.  Plz schedule CPE and fasting labs (no food/drink- except water and/o blk coffee 5 hrs prior) visits, after 08/12/23, for additional refills.

## 2023-07-21 ENCOUNTER — Other Ambulatory Visit: Payer: Self-pay | Admitting: Family Medicine

## 2023-07-21 DIAGNOSIS — N529 Male erectile dysfunction, unspecified: Secondary | ICD-10-CM

## 2023-07-21 DIAGNOSIS — M1712 Unilateral primary osteoarthritis, left knee: Secondary | ICD-10-CM

## 2023-07-21 NOTE — Telephone Encounter (Signed)
 Naproxen Last filled:  05/24/23, #30 Last OV:  05/15/23, 3 mo R heel pain Next OV:  09/02/23, CPE

## 2023-07-29 ENCOUNTER — Other Ambulatory Visit: Payer: Self-pay | Admitting: Family Medicine

## 2023-07-29 DIAGNOSIS — M1712 Unilateral primary osteoarthritis, left knee: Secondary | ICD-10-CM

## 2023-07-29 NOTE — Telephone Encounter (Signed)
 Duplicate request (see 07/21/23 refill note).

## 2023-08-10 ENCOUNTER — Ambulatory Visit (INDEPENDENT_AMBULATORY_CARE_PROVIDER_SITE_OTHER): Payer: Medicare HMO

## 2023-08-10 VITALS — Ht 67.0 in | Wt 220.0 lb

## 2023-08-10 DIAGNOSIS — Z0000 Encounter for general adult medical examination without abnormal findings: Secondary | ICD-10-CM | POA: Diagnosis not present

## 2023-08-10 NOTE — Progress Notes (Signed)
 Subjective:   [name] is a 78 y.o. who presents for a Medicare Wellness preventive visit.  Visit Complete: Virtual I connected with  [name] on 08/10/23 by a audio enabled telemedicine application and verified that I am speaking with the correct person using two identifiers.  Patient Location: Home  Provider Location: Home Office  I discussed the limitations of evaluation and management by telemedicine. The patient expressed understanding and agreed to proceed.  Vital Signs: Because this visit was a virtual/telehealth visit, some criteria may be missing or patient reported. Any vitals not documented were not able to be obtained and vitals that have been documented are patient reported.  VideoDeclined- This patient declined Librarian, academic. Therefore the visit was completed with audio only.  Persons Participating in Visit: Patient.  AWV Questionnaire: No: Patient Medicare AWV questionnaire was not completed prior to this visit.  Cardiac Risk Factors include: advanced age (>33men, >76 women);hypertension;male gender;dyslipidemia;obesity (BMI >30kg/m2)     Objective:    Today's Vitals   08/10/23 0812  Weight: 220 lb (99.8 kg)  Height: 5\' 7"  (1.702 m)   Body mass index is 34.46 kg/m.     08/10/2023    8:36 AM 08/05/2022    8:14 AM 09/02/2021    2:34 PM 08/01/2021   11:24 AM 07/31/2020   11:21 AM 07/27/2019    9:50 AM 07/23/2018    8:48 AM  Advanced Directives  Does Patient Have a Medical Advance Directive? Yes Yes Yes Yes Yes Yes Yes  Type of Estate agent of [place];Living will Healthcare Power of [place];Living will Healthcare Power of [place];Living will Healthcare Power of [place];Living will Healthcare Power of [place];Living will Healthcare Power of [place];Living will Healthcare Power of [place];Living will  Does patient want to make changes to medical advance directive?  No - Patient  declined  Yes (MAU/Ambulatory/Procedural Areas - Information given)     Copy of Healthcare Power of Attorney in Chart? Yes - validated most recent copy scanned in chart (See row information) Yes - validated most recent copy scanned in chart (See row information)   Yes - validated most recent copy scanned in chart (See row information) Yes - validated most recent copy scanned in chart (See row information) No - copy requested    Current Medications (verified) Outpatient Encounter Medications as of 08/10/2023  Medication Sig   acetaminophen (TYLENOL) 500 MG tablet Take 1,000 mg by mouth daily as needed for headache.    albuterol (VENTOLIN HFA) 108 (90 Base) MCG/ACT inhaler INHALE TWO PUFFS INTO THE LUNGS EVERY 6 HOURS AS NEEDED   allopurinol (ZYLOPRIM) 100 MG tablet Take 1 tablet (100 mg total) by mouth daily. Daily for gout prevention   benzonatate (TESSALON) 100 MG capsule Take 1 capsule (100 mg total) by mouth 3 (three) times daily as needed for cough. (Patient not taking: Reported on 08/10/2023)   cholecalciferol (VITAMIN D3) 25 MCG (1000 UNIT) tablet Take 1,000 Units by mouth daily.   co-enzyme Q-10 50 MG capsule Take 1 capsule (50 mg total) by mouth daily.   colchicine 0.6 MG tablet Take 1 tablet (0.6 mg total) by mouth daily as needed (gout flare). First dose may take 2 tablets (1.2mg )   doxazosin (CARDURA) 1 MG tablet Take 1 tablet (1 mg total) by mouth at bedtime.   fluticasone (FLONASE) 50 MCG/ACT nasal spray USE 2 SPRAYS INTO EACH NOSTRIL ONCE DAILY AS NEEDED FOR CONGESTION   furosemide (LASIX) 20 MG  tablet TAKE 0.5 TABLETS (10 MG TOTAL) BY MOUTH DAILY AS NEEDED FOR EDEMA (OR SHORTNESS OF BREATH).   guaiFENesin-codeine (ROBITUSSIN AC) 100-10 MG/5ML syrup Take 5 mLs by mouth 3 (three) times daily as needed for cough. (Patient not taking: Reported on 08/10/2023)   losartan (COZAAR) 50 MG tablet TAKE 1 TABLET BY MOUTH EVERY DAY   lovastatin (MEVACOR) 20 MG tablet Take 1 tablet (20 mg total) by  mouth at bedtime. Daily for cholesterol   Misc Natural Products (OSTEO BI-FLEX ADV TRIPLE ST PO) Take by mouth.   naproxen (NAPROSYN) 500 MG tablet TAKE 1 TABLET BY MOUTH 2 TIMES DAILY AS NEEDED FOR MODERATE PAIN (PAIN SCORE 4-6) WITH FOOD   nitroGLYCERIN (NITRODUR - DOSED IN MG/24 HR) 0.2 mg/hr patch APPLY 1/4 PATCH TO AFFECTED AREA AS DIRECTED BY MD AND CHANGE EVERY 24 HOURS.   pantoprazole (PROTONIX) 40 MG tablet Take 1 tablet (40 mg total) by mouth daily. Daily for reflux   QUERCETIN PO Take by mouth.   scopolamine (TRANSDERM-SCOP) 1 MG/3DAYS PLACE 1 PATCH ONTO THE SKIN EVERY 3 DAYS.   sertraline (ZOLOFT) 100 MG tablet TAKE ONE-HALF TABLET BY MOUTH TWICE A DAY FOR PTSD   sildenafil (REVATIO) 20 MG tablet TAKE 2-5 TABLETS DAILY AS NEEDED   valACYclovir (VALTREX) 1000 MG tablet Take 2 tablets (2,000 mg total) by mouth 2 (two) times daily. For one day per flare   vitamin C (ASCORBIC ACID) 500 MG tablet Take 500 mg by mouth daily.   Zinc 50 MG TABS Take by mouth.   No facility-administered encounter medications on file as of 08/10/2023.    Allergies (verified) Lisinopril   History: Past Medical History:  Diagnosis Date   AR (allergic rhinitis)    Arthritis    PAIN AND OA LEFT KNEE ;  S/P RIGHT TOTAL KNEE REPLACEMENT   Complication of anesthesia    STATES SEVERE PAIN WAKING UP AFTER GENERAL ANESTHESIA; AND FEELS BAD.   Dislocation of finger, interphalangeal joint, left, closed 2022   left ring finger   ED (erectile dysfunction)    GERD (gastroesophageal reflux disease)    Gout    per pt   History of kidney stones    History of nephrolithiasis    seen Dr. [facility], last 02/2011   Hypercholesterolemia    RBBB (right bundle branch block)    Squamous cell skin cancer 07/2019   L clavicle Jaclyn Prime)   Past Surgical History:  Procedure Laterality Date   2D echo  10/11   mild diastolic dysfunction, nl EF    COLONOSCOPY  05/2006   normal, rpt 10 yrs [name], [place])    KNEE ARTHROSCOPY Right 2000   L thumb joint replace Left 2005   GRAMIG    TONSILLECTOMY AND ADENOIDECTOMY  1954   TOTAL HIP ARTHROPLASTY Right 08/13/2016   Procedure: RIGHT TOTAL HIP ARTHROPLASTY ANTERIOR APPROACH;  Surgeon: Ollen Gross, MD;  Location: [facility];  Service: Orthopedics;  Laterality: Right;   TOTAL KNEE ARTHROPLASTY Right 12/2010   TOTAL KNEE ARTHROPLASTY Left 05/08/2014   Procedure: LEFT TOTAL KNEE ARTHROPLASTY;  Surgeon: Loanne Drilling, MD;  Location: [facility];  Service: Orthopedics;  Laterality: Left;   Family History  Problem Relation Age of Onset   Cancer Father        lung and brain (smoker)   Stroke Paternal Grandmother 41       stroke or heart attack (unsure)   Cancer Mother        lung (  smoker)   Cancer Maternal Grandmother        leukemia   CAD Neg Hx        unsure (see above)   Diabetes Neg Hx    Social History   Socioeconomic History   Marital status: Married    Spouse name: Not on file   Number of children: Not on file   Years of education: Not on file   Highest education level: Not on file  Occupational History   Not on file  Tobacco Use   Smoking status: Never   Smokeless tobacco: Never  Vaping Use   Vaping status: Never Used  Substance and Sexual Activity   Alcohol use: No   Drug use: No   Sexual activity: Yes  Other Topics Concern   Not on file  Social History Narrative   Caffeine: occasionally, 2 diet cokes/day   [place] veteran   Married, lives with wife   Retired: Nutritional therapist   Activity: Walks and runs 3 miles a day.  at [facility] 5d/wk. Stays active golfing.    Diet: good water, fruits/vegetables daily, avoiding carbs and watching portion sizes   Social Drivers of Health   Financial Resource Strain: Low Risk  (08/10/2023)   Overall Financial Resource Strain (CARDIA)    Difficulty of Paying Living Expenses: Not hard at all  Food Insecurity: No Food Insecurity (08/10/2023)   Hunger Vital Sign    Worried About Running Out of Food in the  Last Year: Never true    Ran Out of Food in the Last Year: Never true  Transportation Needs: No Transportation Needs (08/10/2023)   PRAPARE - Administrator, Civil Service (Medical): No    Lack of Transportation (Non-Medical): No  Physical Activity: Sufficiently Active (08/10/2023)   Exercise Vital Sign    Days of Exercise per Week: 5 days    Minutes of Exercise per Session: 120 min  Stress: No Stress Concern Present (08/10/2023)   [employer] of Occupational Health - Occupational Stress Questionnaire    Feeling of Stress : Only a little  Recent Concern: Stress - Stress Concern Present (08/10/2023)   [employer] of Occupational Health - Occupational Stress Questionnaire    Feeling of Stress : To some extent  Social Connections: Socially Integrated (08/10/2023)   Social Connection and Isolation Panel [NHANES]    Frequency of Communication with Friends and Family: More than three times a week    Frequency of Social Gatherings with Friends and Family: More than three times a week    Attends Religious Services: More than 4 times per year    Active Member of [employer] or Organizations: Yes    Attends Banker Meetings: 1 to 4 times per year    Marital Status: Married    Tobacco Counseling Counseling given: Not Answered    Clinical Intake:  Pre-visit preparation completed: Yes  Pain : No/denies pain     BMI - recorded: 34.46 Nutritional Status: BMI > 30  Obese Nutritional Risks: None Diabetes: No  How often do you need to have someone help you when you read instructions, pamphlets, or other written materials from your doctor or pharmacy?: 1 - Never  Interpreter Needed?: No  Comments: lives with wife Information entered by :: B.Tayana Shankle,LPN   Activities of Daily Living     08/10/2023    8:37 AM  In your present state of health, do you have any difficulty performing the following activities:  Hearing? 1  Vision? 0  Difficulty concentrating  or making decisions? 0  Walking or climbing stairs? 0  Dressing or bathing? 0  Doing errands, shopping? 0  Preparing Food and eating ? N  Using the Toilet? N  In the past six months, have you accidently leaked urine? N  Do you have problems with loss of bowel control? N  Managing your Medications? N  Managing your Finances? N  Housekeeping or managing your Housekeeping? N    Patient Care Team: Eustaquio Boyden, MD as PCP - General (Family Medicine) Elise Benne, MD as Consulting Physician (Ophthalmology) Ollen Gross, MD as Consulting Physician (Orthopedic Surgery)  Indicate any recent Medical Services you may have received from other than [facility] providers in the past year (date may be approximate).     Assessment:   This is a routine wellness examination for [place].  Hearing/Vision screen Hearing Screening - Comments:: Pt says he has hearing aids from [facility] out;does not need at home Vision Screening - Comments:: Pt says he only needs to read;Dr Dellia Nims   Goals Addressed             This Visit's Progress    Patient Stated       08/10/23-, I will continue to go to my exercise class 5 days a week for 45 minutes.      Patient Stated       08/10/2023, I will continue to go to the [facility] 5 days a week and exercise for 45 minutes.      Patient Stated   On track    08/10/23-Would like to maintain current routine     Patient Stated   Not on track    08/10/23-Lose 20 lbs.       Depression Screen     08/10/2023    8:30 AM 03/10/2023   12:39 PM 02/13/2023   11:05 AM 10/01/2022    3:16 PM 08/12/2022   11:39 AM 08/05/2022    8:10 AM 06/17/2022   11:51 AM  PHQ 2/9 Scores  PHQ - 2 Score 0 0 0 0 0 2 0  PHQ- 9 Score   0   3     Fall Risk     08/10/2023    8:17 AM 03/10/2023   12:39 PM 02/13/2023   11:09 AM 10/01/2022    3:16 PM 08/12/2022   11:39 AM  Fall Risk   Falls in the past year? 0 0 0 0 0  Number falls in past yr: 0  0    Injury with Fall? 0  0    Risk for fall  due to : No Fall Risks  No Fall Risks    Follow up Falls prevention discussed;Education provided  Falls evaluation completed      MEDICARE RISK AT HOME:  Medicare Risk at Home Any stairs in or around the home?: Yes If so, are there any without handrails?: Yes Home free of loose throw rugs in walkways, pet beds, electrical cords, etc?: Yes Adequate lighting in your home to reduce risk of falls?: Yes Life alert?: No Use of a cane, walker or w/c?: No Grab bars in the bathroom?: Yes Shower chair or bench in shower?: Yes Elevated toilet seat or a handicapped toilet?: No  TIMED UP AND GO:  Was the test performed?  No  Cognitive Function: 6CIT completed    07/31/2020   11:32 AM 07/27/2019    9:55 AM 07/23/2018    8:48 AM 07/17/2017    8:30 AM 07/16/2016  1:46 PM  MMSE - Mini Mental State Exam  Orientation to time 5 5 5 5 5   Orientation to Place 5 5 5 5 5   Registration 3 3 3 3 3   Attention/ Calculation 5 5 0 0 0  Recall 3 3 3 3 3   Language- name 2 objects   0 0 0  Language- repeat 1 1 1 1 1   Language- follow 3 step command   3 3 3   Language- read & follow direction   0 0 0  Write a sentence   0 0 0  Copy design   0 0 0  Total score   20 20 20         08/05/2022    8:16 AM  6CIT Screen  What Year? 0 points  What month? 0 points  What time? 0 points  Count back from 20 0 points  Months in reverse 0 points  Repeat phrase 0 points  Total Score 0 points    Immunizations Immunization History  Administered Date(s) Administered   Fluad Quad(high Dose 65+) 03/08/2020, 04/09/2022   Influenza Whole 02/20/2010, 02/26/2011   Influenza, High Dose Seasonal PF 02/19/2017, 02/23/2018, 02/10/2019, 02/21/2021, 05/01/2023   Influenza,inj,Quad PF,6+ Mos 02/23/2013, 03/22/2014, 03/12/2015, 03/11/2016   Influenza,inj,quad, With Preservative 02/23/2018   Pneumococcal Conjugate-13 06/21/2014   Pneumococcal Polysaccharide-23 06/12/2010   Td 07/24/2009   Tdap 10/14/2021   Zoster  Recombinant(Shingrix) 04/07/2022, 07/01/2022    Screening Tests Health Maintenance  Topic Date Due   COVID-19 Vaccine (1) Never done   Medicare Annual Wellness (AWV)  08/09/2024   DTaP/Tdap/Td (3 - Td or Tdap) 10/15/2031   Pneumonia Vaccine 65+ Years old  Completed   INFLUENZA VACCINE  Completed   Hepatitis C Screening  Completed   Zoster Vaccines- Shingrix  Completed   HPV VACCINES  Aged Out   Fecal DNA (Cologuard)  Discontinued    Health Maintenance  Health Maintenance Due  Topic Date Due   COVID-19 Vaccine (1) Never done   Health Maintenance Items Addressed: none needed Vaccinations: declines:   Covid-19: recommend 2 doses one month apart with a booster 6 months later    Additional Screening:  Vision Screening: Recommended annual ophthalmology exams for early detection of glaucoma and other disorders of the eye.  Dental Screening: Recommended annual dental exams for proper oral hygiene  Community Resource Referral / Chronic Care Management: CRR required this visit?  No   CCM required this visit?  No     Plan:     I have personally reviewed and noted the following in the patient's chart:   Medical and social history Use of alcohol, tobacco or illicit drugs  Current medications and supplements including opioid prescriptions. Patient is not currently taking opioid prescriptions. Functional ability and status Nutritional status Physical activity Advanced directives List of other physicians Hospitalizations, surgeries, and ER visits in previous 12 months Vitals Screenings to include cognitive, depression, and falls Referrals and appointments  In addition, I have reviewed and discussed with patient certain preventive protocols, quality metrics, and best practice recommendations. A written personalized care plan for preventive services as well as general preventive health recommendations were provided to patient.     Sue Lush, LPN   [id number]    After Visit Summary: (MyChart) Due to this being a telephonic visit, the after visit summary with patients personalized plan was offered to patient via MyChart   Notes: Nothing significant to report at this time.

## 2023-08-10 NOTE — Patient Instructions (Signed)
 Mr. Barlowe , Thank you for taking time to come for your Medicare Wellness Visit. I appreciate your ongoing commitment to your health goals. Please review the following plan we discussed and let me know if I can assist you in the future.   Referrals/Orders/Follow-Ups/Clinician Recommendations: none  This is a list of the screening recommended for you and due dates:  Health Maintenance  Topic Date Due   COVID-19 Vaccine (1) Never done   Medicare Annual Wellness Visit  08/09/2024   DTaP/Tdap/Td vaccine (3 - Td or Tdap) 10/15/2031   Pneumonia Vaccine  Completed   Flu Shot  Completed   Hepatitis C Screening  Completed   Zoster (Shingles) Vaccine  Completed   HPV Vaccine  Aged Out   Cologuard (Stool DNA test)  Discontinued    Advanced directives: (In Chart) A copy of your advanced directives are scanned into your chart should your provider ever need it.  Next Medicare Annual Wellness Visit scheduled for next year: Yes 08/10/2024 @ 8:10am televisit

## 2023-08-24 ENCOUNTER — Other Ambulatory Visit: Payer: Self-pay | Admitting: Family Medicine

## 2023-08-24 DIAGNOSIS — E7800 Pure hypercholesterolemia, unspecified: Secondary | ICD-10-CM

## 2023-08-24 DIAGNOSIS — R7303 Prediabetes: Secondary | ICD-10-CM

## 2023-08-24 DIAGNOSIS — M1A0790 Idiopathic chronic gout, unspecified ankle and foot, without tophus (tophi): Secondary | ICD-10-CM

## 2023-08-25 ENCOUNTER — Other Ambulatory Visit (INDEPENDENT_AMBULATORY_CARE_PROVIDER_SITE_OTHER)

## 2023-08-25 DIAGNOSIS — R7303 Prediabetes: Secondary | ICD-10-CM

## 2023-08-25 DIAGNOSIS — E7800 Pure hypercholesterolemia, unspecified: Secondary | ICD-10-CM | POA: Diagnosis not present

## 2023-08-25 DIAGNOSIS — M1A0790 Idiopathic chronic gout, unspecified ankle and foot, without tophus (tophi): Secondary | ICD-10-CM

## 2023-08-25 LAB — HEMOGLOBIN A1C: Hgb A1c MFr Bld: 5.7 % (ref 4.6–6.5)

## 2023-08-25 LAB — COMPREHENSIVE METABOLIC PANEL WITH GFR
ALT: 33 U/L (ref 0–53)
AST: 29 U/L (ref 0–37)
Albumin: 4.5 g/dL (ref 3.5–5.2)
Alkaline Phosphatase: 83 U/L (ref 39–117)
BUN: 18 mg/dL (ref 6–23)
CO2: 25 meq/L (ref 19–32)
Calcium: 9.4 mg/dL (ref 8.4–10.5)
Chloride: 106 meq/L (ref 96–112)
Creatinine, Ser: 1.11 mg/dL (ref 0.40–1.50)
GFR: 63.42 mL/min (ref >60.00)
Glucose, Bld: 97 mg/dL (ref 70–99)
Potassium: 3.9 meq/L (ref 3.5–5.1)
Sodium: 140 meq/L (ref 135–145)
Total Bilirubin: 0.6 mg/dL (ref 0.2–1.2)
Total Protein: 7.4 g/dL (ref 6.0–8.3)

## 2023-08-25 LAB — LIPID PANEL
Cholesterol: 165 mg/dL (ref 0–200)
HDL: 39.6 mg/dL (ref >39.00)
LDL Cholesterol: 104 mg/dL — ABNORMAL HIGH (ref 0–99)
NonHDL: 125.83
Total CHOL/HDL Ratio: 4
Triglycerides: 109 mg/dL (ref 0.0–149.0)
VLDL: 21.8 mg/dL (ref 0.0–40.0)

## 2023-08-25 LAB — URIC ACID: Uric Acid, Serum: 6.1 mg/dL (ref 4.0–7.8)

## 2023-08-26 ENCOUNTER — Other Ambulatory Visit: Payer: Medicare HMO

## 2023-09-02 ENCOUNTER — Ambulatory Visit (INDEPENDENT_AMBULATORY_CARE_PROVIDER_SITE_OTHER): Payer: Medicare HMO | Admitting: Family Medicine

## 2023-09-02 ENCOUNTER — Encounter: Payer: Self-pay | Admitting: Family Medicine

## 2023-09-02 VITALS — BP 166/84 | HR 64 | Temp 97.9°F | Ht 65.5 in | Wt 221.0 lb

## 2023-09-02 DIAGNOSIS — M1A0790 Idiopathic chronic gout, unspecified ankle and foot, without tophus (tophi): Secondary | ICD-10-CM | POA: Diagnosis not present

## 2023-09-02 DIAGNOSIS — Z0000 Encounter for general adult medical examination without abnormal findings: Secondary | ICD-10-CM

## 2023-09-02 DIAGNOSIS — Z6836 Body mass index (BMI) 36.0-36.9, adult: Secondary | ICD-10-CM | POA: Diagnosis not present

## 2023-09-02 DIAGNOSIS — I10 Essential (primary) hypertension: Secondary | ICD-10-CM | POA: Diagnosis not present

## 2023-09-02 DIAGNOSIS — Z7189 Other specified counseling: Secondary | ICD-10-CM

## 2023-09-02 DIAGNOSIS — M79671 Pain in right foot: Secondary | ICD-10-CM | POA: Diagnosis not present

## 2023-09-02 DIAGNOSIS — R011 Cardiac murmur, unspecified: Secondary | ICD-10-CM

## 2023-09-02 DIAGNOSIS — K219 Gastro-esophageal reflux disease without esophagitis: Secondary | ICD-10-CM | POA: Diagnosis not present

## 2023-09-02 DIAGNOSIS — G8929 Other chronic pain: Secondary | ICD-10-CM

## 2023-09-02 DIAGNOSIS — N529 Male erectile dysfunction, unspecified: Secondary | ICD-10-CM

## 2023-09-02 DIAGNOSIS — R7303 Prediabetes: Secondary | ICD-10-CM | POA: Diagnosis not present

## 2023-09-02 DIAGNOSIS — E7800 Pure hypercholesterolemia, unspecified: Secondary | ICD-10-CM | POA: Diagnosis not present

## 2023-09-02 DIAGNOSIS — B001 Herpesviral vesicular dermatitis: Secondary | ICD-10-CM

## 2023-09-02 MED ORDER — LOVASTATIN 20 MG PO TABS: 20.0000 mg | ORAL_TABLET | Freq: Every day | ORAL | 4 refills | Status: AC

## 2023-09-02 MED ORDER — VALACYCLOVIR HCL 1 G PO TABS
2000.0000 mg | ORAL_TABLET | Freq: Two times a day (BID) | ORAL | 1 refills | Status: AC
Start: 2023-09-02 — End: ?

## 2023-09-02 MED ORDER — DOXAZOSIN MESYLATE 2 MG PO TABS: 2.0000 mg | ORAL_TABLET | Freq: Every day | ORAL | 3 refills | Status: AC

## 2023-09-02 MED ORDER — ALLOPURINOL 100 MG PO TABS: 100.0000 mg | ORAL_TABLET | Freq: Every day | ORAL | 4 refills | Status: AC

## 2023-09-02 MED ORDER — FUROSEMIDE 20 MG PO TABS: 10.0000 mg | ORAL_TABLET | Freq: Every day | ORAL | 3 refills | Status: AC

## 2023-09-02 MED ORDER — COLCHICINE 0.6 MG PO TABS: 0.6000 mg | ORAL_TABLET | Freq: Every day | ORAL | 1 refills | Status: DC | PRN

## 2023-09-02 MED ORDER — PANTOPRAZOLE SODIUM 40 MG PO TBEC: 40.0000 mg | DELAYED_RELEASE_TABLET | Freq: Every day | ORAL | 4 refills | Status: AC

## 2023-09-02 MED ORDER — SILDENAFIL CITRATE 20 MG PO TABS: ORAL_TABLET | ORAL | 4 refills | Status: DC

## 2023-09-02 MED ORDER — LOSARTAN POTASSIUM 50 MG PO TABS: 50.0000 mg | ORAL_TABLET | Freq: Every day | ORAL | 4 refills | Status: AC

## 2023-09-02 NOTE — Progress Notes (Signed)
 Ph: [phone] Fax: [phone]   Patient ID: [name], male    DOB: 1946-03-26, 78 y.o.   MRN: [id number]  This visit was conducted in person.  BP (!) 166/84   Pulse 64   Temp 97.9 F (36.6 C) (Oral)   Ht 5' 5.5" (1.664 m)   Wt 221 lb (100.2 kg)   SpO2 97%   BMI 36.22 kg/m   BP Readings from Last 3 Encounters:  09/02/23 (!) 166/84  05/15/23 (!) 148/80  03/10/23 (!) 164/84  Home BP this morning 144/74  Elevated on repeat testing in office.  CC: CPE Subjective:   HPI: [name] is a 78 y.o. male presenting on 09/02/2023 for Annual Exam (MCR prt 2 [AWV- 08/10/23].)   Saw health advisor 07/2023 for medicare wellness visit. Note reviewed.   No results found.  Flowsheet Row Office Visit from 09/02/2023 in [facility] [place]  PHQ-2 Total Score 0          09/02/2023   11:00 AM 08/10/2023    8:17 AM 03/10/2023   12:39 PM 02/13/2023   11:09 AM 10/01/2022    3:16 PM  Fall Risk   Falls in the past year? 0 0 0 0 0  Number falls in past yr:  0  0   Injury with Fall?  0  0   Risk for fall due to :  No Fall Risks  No Fall Risks   Follow up  Falls prevention discussed;Education provided  Falls evaluation completed     Chronic R achilles tendinopathy present for >1 year, has seen sports medicine Dr Patsy Lager as well as [facility] Dr Victorino Dike, underwent physical therapy (which worsened symptoms). He's tried heel lift to shoe as well as achilles tendinopathy topical nitroglycerin patch protocol (1/2 patch/day). ASO brace has been helpful. Most recently he's been using LifeWave X39 and X49 patches with benefit.   Right foot xray 04/2023: moderate wear and tear arthritis to the first toe joint as well as chronic thickening at the insertion of the achilles tendon into the heel bone.   HTN - on losartan 50mg  daily, doxazosin 1mg  nightly, furosemide 10mg  daily.   Preventative: Colonoscopy 2008 - normal, rpt 10 yrs [name], [place]).  Cologuard negative 07/2016, 07/2019, 07/2022  Prostate - normal yearly screen. PSA previously reassuring - aged out. Lung cancer screen - not eligible  Flu shot yearly  COVID vaccine - declines  Pneumovax 05/2010, prevnar-13 2016  Td 07/2009, Tdap 09/2021 Shingrix - 03/2022, 06/2022 through [facility] Advanced directives - wife Jessamine then son Arlynn Mcdermid) would be HCPOA. Brought form, reviewed and scanned 06/2016.  Seat belt use discussed.  Sunscreen use discussed. Sees derm regularly in h/o skin cancer.  Sleep - averaging 6 hours/night Non smoker  Alcohol - rare  Dentist q3 mo Eye exam yearly  Bowel - no constipation  Bladder - notes intermittent urinary urgency    Caffeine: occaisonally, 2 diet cokes/day Married, lives with wife [place] veteran Retired Nutritional therapist Activity: Walks and runs 3 miles a day at [employer] 6d/wk. works out 2-3hr/day. Golfing.  Diet: good water, fruits/vegetables daily, avoiding carbs and watching portion sizes     Relevant past medical, surgical, family and social history reviewed and updated as indicated. Interim medical history since our last visit reviewed. Allergies and medications reviewed and updated. Outpatient Medications Prior to Visit  Medication Sig Dispense Refill   acetaminophen (TYLENOL) 500 MG tablet Take 1,000 mg by  mouth daily as needed for headache.      albuterol (VENTOLIN HFA) 108 (90 Base) MCG/ACT inhaler INHALE TWO PUFFS INTO THE LUNGS EVERY 6 HOURS AS NEEDED 18 each 3   cholecalciferol (VITAMIN D3) 25 MCG (1000 UNIT) tablet Take 1,000 Units by mouth daily.     co-enzyme Q-10 50 MG capsule Take 1 capsule (50 mg total) by mouth daily.     fluticasone (FLONASE) 50 MCG/ACT nasal spray USE 2 SPRAYS INTO EACH NOSTRIL ONCE DAILY AS NEEDED FOR CONGESTION 16 mL 9   guaiFENesin-codeine (ROBITUSSIN AC) 100-10 MG/5ML syrup Take 5 mLs by mouth 3 (three) times daily as needed for cough. 120 mL 0   Misc Natural Products (OSTEO BI-FLEX ADV TRIPLE ST PO)  Take by mouth.     QUERCETIN PO Take by mouth.     scopolamine (TRANSDERM-SCOP) 1 MG/3DAYS PLACE 1 PATCH ONTO THE SKIN EVERY 3 DAYS. (Patient taking differently: As needed for travel) 10 patch 0   sertraline (ZOLOFT) 100 MG tablet TAKE ONE-HALF TABLET BY MOUTH TWICE A DAY FOR PTSD     Zinc 50 MG TABS Take by mouth.     allopurinol (ZYLOPRIM) 100 MG tablet Take 1 tablet (100 mg total) by mouth daily. Daily for gout prevention 90 tablet 4   colchicine 0.6 MG tablet Take 1 tablet (0.6 mg total) by mouth daily as needed (gout flare). First dose may take 2 tablets (1.2mg ) 30 tablet 3   doxazosin (CARDURA) 1 MG tablet Take 1 tablet (1 mg total) by mouth at bedtime. 90 tablet 4   furosemide (LASIX) 20 MG tablet TAKE 0.5 TABLETS (10 MG TOTAL) BY MOUTH DAILY AS NEEDED FOR EDEMA (OR SHORTNESS OF BREATH). 20 tablet 0   losartan (COZAAR) 50 MG tablet TAKE 1 TABLET BY MOUTH EVERY DAY 30 tablet 12   lovastatin (MEVACOR) 20 MG tablet Take 1 tablet (20 mg total) by mouth at bedtime. Daily for cholesterol 90 tablet 4   nitroGLYCERIN (NITRODUR - DOSED IN MG/24 HR) 0.2 mg/hr patch APPLY 1/4 PATCH TO AFFECTED AREA AS DIRECTED BY MD AND CHANGE EVERY 24 HOURS. 30 patch 0   pantoprazole (PROTONIX) 40 MG tablet Take 1 tablet (40 mg total) by mouth daily. Daily for reflux 90 tablet 4   sildenafil (REVATIO) 20 MG tablet TAKE 2-5 TABLETS DAILY AS NEEDED 45 tablet 0   valACYclovir (VALTREX) 1000 MG tablet Take 2 tablets (2,000 mg total) by mouth 2 (two) times daily. For one day per flare 20 tablet 1   benzonatate (TESSALON) 100 MG capsule Take 1 capsule (100 mg total) by mouth 3 (three) times daily as needed for cough. (Patient not taking: Reported on 08/10/2023) 30 capsule 0   naproxen (NAPROSYN) 500 MG tablet TAKE 1 TABLET BY MOUTH 2 TIMES DAILY AS NEEDED FOR MODERATE PAIN (PAIN SCORE 4-6) WITH FOOD 30 tablet 0   vitamin C (ASCORBIC ACID) 500 MG tablet Take 500 mg by mouth daily.     No facility-administered medications  prior to visit.     Per HPI unless specifically indicated in ROS section below Review of Systems  Constitutional:  Negative for activity change, appetite change, chills, fatigue, fever and unexpected weight change.  HENT:  Negative for hearing loss.   Eyes:  Negative for visual disturbance.  Respiratory:  Negative for cough, chest tightness, shortness of breath and wheezing.   Cardiovascular:  Negative for chest pain, palpitations and leg swelling.  Gastrointestinal:  Negative for abdominal distention, abdominal pain, blood in  stool, constipation, diarrhea, nausea and vomiting.  Genitourinary:  Negative for difficulty urinating and hematuria.  Musculoskeletal:  Negative for arthralgias, myalgias and neck pain.  Skin:  Negative for rash.  Neurological:  Negative for dizziness, seizures, syncope and headaches.  Hematological:  Negative for adenopathy. Does not bruise/bleed easily.  Psychiatric/Behavioral:  Negative for dysphoric mood. The patient is not nervous/anxious.     Objective:  BP (!) 166/84   Pulse 64   Temp 97.9 F (36.6 C) (Oral)   Ht 5' 5.5" (1.664 m)   Wt 221 lb (100.2 kg)   SpO2 97%   BMI 36.22 kg/m   Wt Readings from Last 3 Encounters:  09/02/23 221 lb (100.2 kg)  08/10/23 220 lb (99.8 kg)  05/15/23 225 lb 6 oz (102.2 kg)      Physical Exam Vitals and nursing note reviewed.  Constitutional:      General: He is not in acute distress.    Appearance: Normal appearance. He is well-developed. He is not ill-appearing.  HENT:     Head: Normocephalic and atraumatic.     Right Ear: Hearing, tympanic membrane, ear canal and external ear normal.     Left Ear: Hearing, tympanic membrane, ear canal and external ear normal.     Mouth/Throat:     Mouth: Mucous membranes are moist.     Pharynx: Oropharynx is clear. No oropharyngeal exudate or posterior oropharyngeal erythema.  Eyes:     General: No scleral icterus.    Extraocular Movements: Extraocular movements intact.      Conjunctiva/sclera: Conjunctivae normal.     Pupils: Pupils are equal, round, and reactive to light.  Neck:     Thyroid: No thyroid mass or thyromegaly.     Vascular: No carotid bruit.  Cardiovascular:     Rate and Rhythm: Normal rate and regular rhythm.     Pulses: Normal pulses.          Radial pulses are 2+ on the right side and 2+ on the left side.     Heart sounds: Normal heart sounds. No murmur heard. Pulmonary:     Effort: Pulmonary effort is normal. No respiratory distress.     Breath sounds: Normal breath sounds. No wheezing, rhonchi or rales.  Abdominal:     General: Bowel sounds are normal. There is no distension.     Palpations: Abdomen is soft. There is no mass.     Tenderness: There is no abdominal tenderness. There is no guarding or rebound.     Hernia: No hernia is present.  Musculoskeletal:        General: Normal range of motion.     Cervical back: Normal range of motion and neck supple.     Right lower leg: No edema.     Left lower leg: No edema.  Lymphadenopathy:     Cervical: No cervical adenopathy.  Skin:    General: Skin is warm and dry.     Findings: No rash.  Neurological:     General: No focal deficit present.     Mental Status: He is alert and oriented to person, place, and time.  Psychiatric:        Mood and Affect: Mood normal.        Behavior: Behavior normal.        Thought Content: Thought content normal.        Judgment: Judgment normal.       Results for orders placed or performed in visit on 08/25/23  Uric acid   Collection Time: 08/25/23  9:06 AM  Result Value Ref Range   Uric Acid, Serum 6.1 4.0 - 7.8 mg/dL  Hemoglobin Z6X   Collection Time: 08/25/23  9:06 AM  Result Value Ref Range   Hgb A1c MFr Bld 5.7 4.6 - 6.5 %  Comprehensive metabolic panel with GFR   Collection Time: 08/25/23  9:06 AM  Result Value Ref Range   Sodium 140 135 - 145 mEq/L   Potassium 3.9 3.5 - 5.1 mEq/L   Chloride 106 96 - 112 mEq/L   CO2 25 19 - 32  mEq/L   Glucose, Bld 97 70 - 99 mg/dL   BUN 18 6 - 23 mg/dL   Creatinine, Ser 0.96 0.40 - 1.50 mg/dL   Total Bilirubin 0.6 0.2 - 1.2 mg/dL   Alkaline Phosphatase 83 39 - 117 U/L   AST 29 0 - 37 U/L   ALT 33 0 - 53 U/L   Total Protein 7.4 6.0 - 8.3 g/dL   Albumin 4.5 3.5 - 5.2 g/dL   GFR 04.54 >09.81 mL/min   Calcium 9.4 8.4 - 10.5 mg/dL  Lipid panel   Collection Time: 08/25/23  9:06 AM  Result Value Ref Range   Cholesterol 165 0 - 200 mg/dL   Triglycerides 191.4 0.0 - 149.0 mg/dL   HDL 78.29 >56.21 mg/dL   VLDL 30.8 0.0 - 65.7 mg/dL   LDL Cholesterol 846 (H) 0 - 99 mg/dL   Total CHOL/HDL Ratio 4    NonHDL 125.83     Assessment & Plan:   Problem List Items Addressed This Visit     Health maintenance examination - Primary (Chronic)   Preventative protocols reviewed and updated unless pt declined. Discussed healthy diet and lifestyle.       Advanced care planning/counseling discussion (Chronic)   Previously discussed      HYPERCHOLESTEROLEMIA   Chronic, overall stable on lovastatin - continue. The 10-year ASCVD risk score (Arnett DK, et al., 2019) is: 48.5%   Values used to calculate the score:     Age: 47 years     Sex: Male     Is Non-Hispanic African American: No     Diabetic: No     Tobacco smoker: No     Systolic Blood Pressure: 166 mmHg     Is BP treated: Yes     HDL Cholesterol: 39.6 mg/dL     Total Cholesterol: 165 mg/dL       Relevant Medications   furosemide (LASIX) 20 MG tablet   losartan (COZAAR) 50 MG tablet   lovastatin (MEVACOR) 20 MG tablet   sildenafil (REVATIO) 20 MG tablet   doxazosin (CARDURA) 2 MG tablet   Prediabetes   Continue to encourage limiting sugar/carbs in diet.       GERD (gastroesophageal reflux disease)   Chronic stable on pantoprazole 40mg  daily       Relevant Medications   pantoprazole (PROTONIX) 40 MG tablet   Severe obesity (BMI 35.0-39.9) with comorbidity (HCC)   Continue to encourage healthy diet and lifestyle  choices to affect sustainable weight loss.       Chronic gout   Chronic, stable on low dose allopurinol without recent gout flare. No recent colchicine need      Relevant Medications   allopurinol (ZYLOPRIM) 100 MG tablet   colchicine 0.6 MG tablet   Systolic murmur   Not appreciated today       Hypertension   Chronic, deteriorated despite current regimen  Increase doxazosin to 2mg  nightly.  Continue losartan 50mg  and lasix 10mg   DASH diet handout provided.  Advised to continue monitoring BP at home and bring in home cuff to next appt to compare to our readings.  RTC 3 mo HTN f/u visit       Relevant Medications   furosemide (LASIX) 20 MG tablet   losartan (COZAAR) 50 MG tablet   lovastatin (MEVACOR) 20 MG tablet   sildenafil (REVATIO) 20 MG tablet   doxazosin (CARDURA) 2 MG tablet   Chronic heel pain, right   He's started using phototherapy with benefit.       Herpes labialis   Continue PRN oral valtrex      Relevant Medications   valACYclovir (VALTREX) 1000 MG tablet   Other Visit Diagnoses       Erectile dysfunction, unspecified erectile dysfunction type       Relevant Medications   sildenafil (REVATIO) 20 MG tablet        Meds ordered this encounter  Medications   allopurinol (ZYLOPRIM) 100 MG tablet    Sig: Take 1 tablet (100 mg total) by mouth daily. Daily for gout prevention    Dispense:  90 tablet    Refill:  4   colchicine 0.6 MG tablet    Sig: Take 1 tablet (0.6 mg total) by mouth daily as needed (gout flare). First dose may take 2 tablets (1.2mg )    Dispense:  30 tablet    Refill:  1   furosemide (LASIX) 20 MG tablet    Sig: Take 0.5 tablets (10 mg total) by mouth daily.    Dispense:  45 tablet    Refill:  3   losartan (COZAAR) 50 MG tablet    Sig: Take 1 tablet (50 mg total) by mouth daily.    Dispense:  90 tablet    Refill:  4   lovastatin (MEVACOR) 20 MG tablet    Sig: Take 1 tablet (20 mg total) by mouth at bedtime. Daily for  cholesterol    Dispense:  90 tablet    Refill:  4   pantoprazole (PROTONIX) 40 MG tablet    Sig: Take 1 tablet (40 mg total) by mouth daily. Daily for reflux    Dispense:  90 tablet    Refill:  4   sildenafil (REVATIO) 20 MG tablet    Sig: TAKE 2-5 TABLETS DAILY AS NEEDED    Dispense:  45 tablet    Refill:  4   valACYclovir (VALTREX) 1000 MG tablet    Sig: Take 2 tablets (2,000 mg total) by mouth 2 (two) times daily. For one day per flare    Dispense:  20 tablet    Refill:  1   doxazosin (CARDURA) 2 MG tablet    Sig: Take 1 tablet (2 mg total) by mouth at bedtime.    Dispense:  90 tablet    Refill:  3    No orders of the defined types were placed in this encounter.   Patient Instructions  BP staying elevated - increase doxazosin to 2mg  nightly - new dose at pharmacy. Work on low salt/sodium diet - goal <2 grams (2,000mg ) per day. Eat a diet high in fruits/vegetables and whole grains.  Look into mediterranean and DASH diet. Goal activity is 135min/wk of moderate intensity exercise.  This can be split into 30 minute chunks. Look at www.heart.org for more resources  Let me know if home readings running high.  Return in 3 months for blood  pressure follow up  Follow up plan: Return in about 3 months (around 12/02/2023) for follow up visit.  Eustaquio Boyden, MD

## 2023-09-02 NOTE — Assessment & Plan Note (Signed)
 Chronic, stable on low dose allopurinol without recent gout flare. No recent colchicine need

## 2023-09-02 NOTE — Assessment & Plan Note (Signed)
 Preventative protocols reviewed and updated unless pt declined. Discussed healthy diet and lifestyle.

## 2023-09-02 NOTE — Patient Instructions (Addendum)
 BP staying elevated - increase doxazosin to 2mg  nightly - new dose at pharmacy. Work on low salt/sodium diet - goal <2 grams (2,000mg ) per day. Eat a diet high in fruits/vegetables and whole grains.  Look into mediterranean and DASH diet. Goal activity is 114min/wk of moderate intensity exercise.  This can be split into 30 minute chunks. Look at www.heart.org for more resources  Let me know if home readings running high.  Return in 3 months for blood pressure follow up

## 2023-09-02 NOTE — Assessment & Plan Note (Signed)
 Continue to encourage limiting sugar/carbs in diet.

## 2023-09-02 NOTE — Assessment & Plan Note (Signed)
 Continue PRN oral valtrex

## 2023-09-02 NOTE — Assessment & Plan Note (Signed)
 Not appreciated today.

## 2023-09-02 NOTE — Assessment & Plan Note (Signed)
 Continue to encourage healthy diet and lifestyle choices to affect sustainable weight loss.

## 2023-09-02 NOTE — Assessment & Plan Note (Signed)
 Chronic, overall stable on lovastatin - continue. The 10-year ASCVD risk score (Arnett DK, et al., 2019) is: 48.5%   Values used to calculate the score:     Age: 78 years     Sex: Male     Is Non-Hispanic African American: No     Diabetic: No     Tobacco smoker: No     Systolic Blood Pressure: 166 mmHg     Is BP treated: Yes     HDL Cholesterol: 39.6 mg/dL     Total Cholesterol: 165 mg/dL

## 2023-09-02 NOTE — Assessment & Plan Note (Addendum)
 Chronic, deteriorated despite current regimen Increase doxazosin to 2mg  nightly.  Continue losartan 50mg  and lasix 10mg   DASH diet handout provided.  Advised to continue monitoring BP at home and bring in home cuff to next appt to compare to our readings.  RTC 3 mo HTN f/u visit

## 2023-09-02 NOTE — Assessment & Plan Note (Signed)
 Previously discussed.

## 2023-09-02 NOTE — Assessment & Plan Note (Signed)
 He's started using phototherapy with benefit.

## 2023-09-02 NOTE — Assessment & Plan Note (Signed)
 Chronic stable on pantoprazole 40mg  daily

## 2023-09-03 ENCOUNTER — Other Ambulatory Visit: Payer: Self-pay | Admitting: Family Medicine

## 2023-09-04 NOTE — Telephone Encounter (Signed)
 Strength increased to 2 mg nightly (see 09/02/23 OV notes). New rx sent 09/02/23, #90/3 refills to [facility]   Request denied.

## 2023-09-17 ENCOUNTER — Telehealth: Payer: Self-pay

## 2023-09-17 DIAGNOSIS — N529 Male erectile dysfunction, unspecified: Secondary | ICD-10-CM

## 2023-09-17 NOTE — Telephone Encounter (Signed)
 Pharmacy Patient Advocate Encounter   Received notification from CoverMyMeds that prior authorization for Sildenafil  Citrate (PAH) 20MG  tablets is required/requested.   Insurance verification completed.   The patient is insured through [employer] .   Per test claim: PA required; PA submitted to above mentioned insurance via CoverMyMeds Key/confirmation #/EOC [id number] Status is pending

## 2023-09-17 NOTE — Telephone Encounter (Signed)
 Pharmacy Patient Advocate Encounter  Received notification from [employer] that Prior Authorization for Sildenafil  Citrate (PAH) 20MG  tablets has been DENIED.  See denial reason below. No denial letter attached in CMM. Will attach denial letter to Media tab once received.   PA #/Case ID/Reference #: [id number]

## 2023-09-17 NOTE — Telephone Encounter (Signed)
 Generic sildenafil  20mg  is not covered by insurance for ED.  He will always have to pay out of pocket for this.

## 2023-09-18 NOTE — Telephone Encounter (Signed)
 Spoke with pt relaying Dr Ocie Belt message. Verbalizes understanding and [facility].

## 2023-09-26 ENCOUNTER — Other Ambulatory Visit: Payer: Self-pay | Admitting: Family Medicine

## 2023-09-26 DIAGNOSIS — M1A0790 Idiopathic chronic gout, unspecified ankle and foot, without tophus (tophi): Secondary | ICD-10-CM

## 2023-09-28 NOTE — Telephone Encounter (Signed)
 Message from pharmacy:   REQUEST FOR 90 DAYS PRESCRIPTION.   Colchicine  Last rx:  09/02/23. #30 Last OV:  09/02/23, CPE Next OV:  12/02/23, 3 mo HTN f/u

## 2023-09-30 NOTE — Telephone Encounter (Signed)
 ERx

## 2023-10-10 ENCOUNTER — Other Ambulatory Visit: Payer: Self-pay | Admitting: Family Medicine

## 2023-10-10 DIAGNOSIS — N529 Male erectile dysfunction, unspecified: Secondary | ICD-10-CM

## 2023-10-12 NOTE — Telephone Encounter (Signed)
 Too soon. Rx sent 09/02/23, #45/4 refills to [facility]  Request denied.

## 2023-10-13 MED ORDER — SILDENAFIL CITRATE 20 MG PO TABS: ORAL_TABLET | ORAL | 4 refills | Status: AC

## 2023-10-13 NOTE — Telephone Encounter (Signed)
 Copied from CRM [id number]. Topic: Clinical - Prescription Issue >> Oct 12, 2023  1:52 PM Caliyah H wrote: Reason for CRM: Patient called stating he received a text from his pharmacy that the sildenafil  prescription was denied. Upon review, the medication was reordered by the nurse on 10/10/23; however, the last e-prescribing status on file is from 09/02/23.It appears the recent reorder may not have been transmitted to the pharmacy yet.   Preferred Pharmacy  [facility] [id number] - [place], [place] - [address] [place] [address] Phone: [phone] Fax: [phone] Hours: Not open 24 hours

## 2023-10-13 NOTE — Addendum Note (Signed)
 Addended by: Canaan Holzer on: 10/13/2023 09:15 AM   Modules accepted: Orders

## 2023-10-13 NOTE — Telephone Encounter (Addendum)
 E-scribed rx to [facility].   Spoke with pt notifying him of above. Pt expresses his thanks and will c[facility]

## 2023-12-02 ENCOUNTER — Ambulatory Visit (INDEPENDENT_AMBULATORY_CARE_PROVIDER_SITE_OTHER): Admitting: Family Medicine

## 2023-12-02 ENCOUNTER — Encounter: Payer: Self-pay | Admitting: Family Medicine

## 2023-12-02 ENCOUNTER — Ambulatory Visit: Payer: Self-pay | Admitting: Family Medicine

## 2023-12-02 VITALS — BP 146/74 | HR 68 | Temp 97.6°F | Ht 65.5 in | Wt 213.4 lb

## 2023-12-02 DIAGNOSIS — R109 Unspecified abdominal pain: Secondary | ICD-10-CM | POA: Diagnosis not present

## 2023-12-02 DIAGNOSIS — N200 Calculus of kidney: Secondary | ICD-10-CM

## 2023-12-02 DIAGNOSIS — R10A2 Flank pain, left side: Secondary | ICD-10-CM | POA: Insufficient documentation

## 2023-12-02 DIAGNOSIS — I10 Essential (primary) hypertension: Secondary | ICD-10-CM

## 2023-12-02 LAB — POC URINALSYSI DIPSTICK (AUTOMATED)
Bilirubin, UA: NEGATIVE
Blood, UA: NEGATIVE
Glucose, UA: NEGATIVE
Ketones, UA: NEGATIVE
Leukocytes, UA: NEGATIVE
Nitrite, UA: NEGATIVE
Protein, UA: NEGATIVE
Spec Grav, UA: >=1.03 — AB (ref 1.010–1.025)
Urobilinogen, UA: 0.2 U/dL
pH, UA: 6 (ref 5.0–8.0)

## 2023-12-02 NOTE — Patient Instructions (Addendum)
 Continue current medicines Good to see you today  Goal BP <150/90, ideally <140/90.  Continue monitoring blood pressures at home, let me know if consistently >150.  Return in 4-6 months for follow up visit
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
## 2023-12-02 NOTE — Assessment & Plan Note (Signed)
 See above. Update UA.

## 2023-12-02 NOTE — Assessment & Plan Note (Addendum)
 Chronic overall adequate on current regimen - continue. He notes BP better controlled after working out.

## 2023-12-02 NOTE — Assessment & Plan Note (Addendum)
 Remote h/o this, with new intermittent L flank pain.  Update UA as per below.

## 2023-12-16 DIAGNOSIS — H02831 Dermatochalasis of right upper eyelid: Secondary | ICD-10-CM | POA: Diagnosis not present

## 2023-12-16 DIAGNOSIS — H5203 Hypermetropia, bilateral: Secondary | ICD-10-CM | POA: Diagnosis not present

## 2023-12-16 DIAGNOSIS — H52203 Unspecified astigmatism, bilateral: Secondary | ICD-10-CM | POA: Diagnosis not present

## 2023-12-16 DIAGNOSIS — H2513 Age-related nuclear cataract, bilateral: Secondary | ICD-10-CM | POA: Diagnosis not present

## 2023-12-16 DIAGNOSIS — H02834 Dermatochalasis of left upper eyelid: Secondary | ICD-10-CM | POA: Diagnosis not present

## 2024-03-03 NOTE — Progress Notes (Signed)
 [name]                                          MRN: [id number]   03/03/2024   The [facility] Quality Team Specialist reviewed this patient medical record for the purposes of chart review for care gap closure. The following were reviewed: chart review for care gap closure-controlling blood pressure.    [facility] Quality Team

## 2024-04-11 ENCOUNTER — Ambulatory Visit: Payer: Self-pay

## 2024-04-11 NOTE — Telephone Encounter (Signed)
 Spoke with pt and he states that what he is coughing up is NOT blood.

## 2024-04-11 NOTE — Telephone Encounter (Signed)
 Appreciate Tabitha seeing this nice patient.

## 2024-04-11 NOTE — Telephone Encounter (Signed)
 Manuelita no need to triage but can we call pt and just make sure dark brown phlegm just not resemble blood

## 2024-04-11 NOTE — Telephone Encounter (Signed)
 FYI Only or Action Required?: FYI only for provider: appointment scheduled on 04/12/2024.  Patient was last seen in primary care on 12/02/2023 by Rilla Baller, MD.  Called Nurse Triage reporting Cough.  Symptoms began several days ago.  Symptoms are: gradually worsening.  Triage Disposition: See Physician Within 24 Hours  Patient/caregiver understands and will follow disposition?: Yes       Copied from CRM #[id number]. Topic: Clinical - Red Word Triage >> Apr 11, 2024  9:21 AM Mia F wrote: Red Word that prompted transfer to Nurse Triage: Cough and congestion. Has been coughing up dark brown phlegm. No SOB or chest discomofort. No fever. Started Friday and has been getting worse. Reason for Disposition  SEVERE coughing spells (e.g., whooping sound after coughing, vomiting after coughing)  Answer Assessment - Initial Assessment Questions This RN spoke with pt's wife, [place]. This RN scheduled pt for appointment tomorrow. This RN educated pt wife on new-worsening symptoms and when to call back. Pt wife verbalized understanding and agrees to plan.   Productive cough since Friday Coughing up dark brown phlegm Body aches Denies fever, hemoptysis, chest pain, SOB  Protocols used: Cough - Acute Productive-A-AH

## 2024-04-12 ENCOUNTER — Ambulatory Visit (INDEPENDENT_AMBULATORY_CARE_PROVIDER_SITE_OTHER): Admitting: Family

## 2024-04-12 VITALS — BP 144/86 | HR 63 | Temp 98.0°F | Ht 65.5 in | Wt 213.0 lb

## 2024-04-12 DIAGNOSIS — H109 Unspecified conjunctivitis: Secondary | ICD-10-CM

## 2024-04-12 DIAGNOSIS — J019 Acute sinusitis, unspecified: Secondary | ICD-10-CM

## 2024-04-12 DIAGNOSIS — Z20822 Contact with and (suspected) exposure to covid-19: Secondary | ICD-10-CM | POA: Diagnosis not present

## 2024-04-12 DIAGNOSIS — B9689 Other specified bacterial agents as the cause of diseases classified elsewhere: Secondary | ICD-10-CM

## 2024-04-12 DIAGNOSIS — R051 Acute cough: Secondary | ICD-10-CM

## 2024-04-12 LAB — POC COVID19 BINAXNOW: SARS Coronavirus 2 Ag: NEGATIVE

## 2024-04-12 MED ORDER — ERYTHROMYCIN 5 MG/GM OP OINT: 1.0000 | TOPICAL_OINTMENT | Freq: Four times a day (QID) | OPHTHALMIC | 0 refills | Status: AC

## 2024-04-12 MED ORDER — AMOXICILLIN-POT CLAVULANATE 875-125 MG PO TABS: 1.0000 | ORAL_TABLET | Freq: Two times a day (BID) | ORAL | 0 refills | Status: DC

## 2024-04-12 MED ORDER — BENZONATATE 200 MG PO CAPS: 200.0000 mg | ORAL_CAPSULE | Freq: Two times a day (BID) | ORAL | 0 refills | Status: DC | PRN

## 2024-04-12 NOTE — Progress Notes (Signed)
 Established Patient Office Visit  Subjective:      CC:  Chief Complaint  Patient presents with   Acute Visit    Productive cough with brown mucus, woke up this morning with crusty eyes. Denies sore throat, fever, chills, body aches.    HPI: [name] is a 78 y.o. male presenting on 04/12/2024 for Acute Visit (Productive cough with brown mucus, woke up this morning with crusty eyes. Denies sore throat, fever, chills, body aches.) .  Discussed the use of AI scribe software for clinical note transcription with the patient, who gave verbal consent to proceed.  History of Present Illness [name] is a 78 year old male who presents with a productive cough and crusty eyes.  He began experiencing symptoms four days ago, starting with a productive cough that produces dark brown sputum. The cough worsens when lying down, causing him to cough throughout the night. He has no chest pain, heart palpitations, or swelling in the legs or ankles. He has not had a fever or sore throat but feels as if something is stuck in his throat.  This morning, he woke up with crusty eyes. He reports discharge from his eyes. Last night, he experienced blurry vision while watching TV, which improved with blinking but returned shortly after. No sinus pressure or drainage down the throat, but his eyes were itchy last night.  He has not tested for COVID-19 at home. He has been taking an antihistamine for allergies. He reports a decreased appetite and prefers Gatorade Zero over water. He has not exercised since the onset of his symptoms.         Social history:  Relevant past medical, surgical, family and social history reviewed and updated as indicated. Interim medical history since our last visit reviewed.  Allergies and medications reviewed and updated.  DATA REVIEWED: CHART IN EPIC     ROS: Negative unless specifically indicated above in HPI.    Current Outpatient  Medications:    acetaminophen  (TYLENOL ) 500 MG tablet, Take 1,000 mg by mouth daily as needed for headache. , Disp: , Rfl:    albuterol  (VENTOLIN  HFA) 108 (90 Base) MCG/ACT inhaler, INHALE TWO PUFFS INTO THE LUNGS EVERY 6 HOURS AS NEEDED, Disp: 18 each, Rfl: 3   allopurinol  (ZYLOPRIM ) 100 MG tablet, Take 1 tablet (100 mg total) by mouth daily. Daily for gout prevention, Disp: 90 tablet, Rfl: 4   amoxicillin -clavulanate (AUGMENTIN ) 875-125 MG tablet, Take 1 tablet by mouth 2 (two) times daily., Disp: 20 tablet, Rfl: 0   benzonatate  (TESSALON ) 200 MG capsule, Take 1 capsule (200 mg total) by mouth 2 (two) times daily as needed for cough., Disp: 20 capsule, Rfl: 0   cholecalciferol (VITAMIN D3) 25 MCG (1000 UNIT) tablet, Take 1,000 Units by mouth daily., Disp: , Rfl:    co-enzyme Q-10 50 MG capsule, Take 1 capsule (50 mg total) by mouth daily., Disp:  , Rfl:    colchicine  0.6 MG tablet, TAKE 1 TABLET BY MOUTH DAILY AS NEEDED (GOUT FLARE). FIRST DOSE MAY TAKE 2 TABLETS (1.2MG ), Disp: 30 tablet, Rfl: 1   doxazosin  (CARDURA ) 2 MG tablet, Take 1 tablet (2 mg total) by mouth at bedtime., Disp: 90 tablet, Rfl: 3   erythromycin ophthalmic ointment, Place 1 Application into the left eye 4 (four) times daily for 5 days., Disp: 20 g, Rfl: 0   fluticasone  (FLONASE ) 50 MCG/ACT nasal spray, USE 2 SPRAYS INTO EACH NOSTRIL ONCE DAILY AS NEEDED FOR  CONGESTION, Disp: 16 mL, Rfl: 9   furosemide  (LASIX ) 20 MG tablet, Take 0.5 tablets (10 mg total) by mouth daily., Disp: 45 tablet, Rfl: 3   guaiFENesin -codeine  (ROBITUSSIN AC) 100-10 MG/5ML syrup, Take 5 mLs by mouth 3 (three) times daily as needed for cough., Disp: 120 mL, Rfl: 0   losartan  (COZAAR ) 50 MG tablet, Take 1 tablet (50 mg total) by mouth daily., Disp: 90 tablet, Rfl: 4   lovastatin  (MEVACOR ) 20 MG tablet, Take 1 tablet (20 mg total) by mouth at bedtime. Daily for cholesterol, Disp: 90 tablet, Rfl: 4   Misc Natural Products (OSTEO BI-FLEX ADV TRIPLE ST PO), Take  by mouth., Disp: , Rfl:    pantoprazole  (PROTONIX ) 40 MG tablet, Take 1 tablet (40 mg total) by mouth daily. Daily for reflux, Disp: 90 tablet, Rfl: 4   QUERCETIN PO, Take by mouth., Disp: , Rfl:    scopolamine  (TRANSDERM-SCOP) 1 MG/3DAYS, PLACE 1 PATCH ONTO THE SKIN EVERY 3 DAYS. (Patient taking differently: As needed for travel), Disp: 10 patch, Rfl: 0   sertraline (ZOLOFT) 100 MG tablet, TAKE ONE-HALF TABLET BY MOUTH TWICE A DAY FOR PTSD, Disp: , Rfl:    sildenafil  (REVATIO ) 20 MG tablet, TAKE 2-5 TABLETS DAILY AS NEEDED, Disp: 45 tablet, Rfl: 4   valACYclovir  (VALTREX ) 1000 MG tablet, Take 2 tablets (2,000 mg total) by mouth 2 (two) times daily. For one day per flare, Disp: 20 tablet, Rfl: 1   Zinc 50 MG TABS, Take by mouth., Disp: , Rfl:         Objective:        BP (!) 144/86 (BP Location: Left Arm, Patient Position: Sitting, Cuff Size: Large)   Pulse 63   Temp 98 F (36.7 C) (Temporal)   Ht 5' 5.5 (1.664 m)   Wt 213 lb (96.6 kg)   SpO2 98%   BMI 34.91 kg/m   Physical Exam HEENT: Eyes red with crusting and yellow discharge. Facial swelling present. CHEST: Breath sounds slightly thickened.  Wt Readings from Last 3 Encounters:  04/12/24 213 lb (96.6 kg)  12/02/23 213 lb 6.4 oz (96.8 kg)  09/02/23 221 lb (100.2 kg)    Physical Exam Vitals reviewed.  Constitutional:      General: He is not in acute distress.    Appearance: Normal appearance. He is obese. He is not ill-appearing, toxic-appearing or diaphoretic.  HENT:     Head: Normocephalic.     Right Ear: Tympanic membrane normal.     Left Ear: Tympanic membrane normal.     Nose: Nose normal.     Mouth/Throat:     Mouth: Mucous membranes are moist.  Eyes:     General: No allergic shiner or visual field deficit.       Right eye: Discharge present.        Left eye: Discharge present.    Extraocular Movements: Extraocular movements intact.     Conjunctiva/sclera: Conjunctivae normal.     Pupils: Pupils are  equal, round, and reactive to light.  Cardiovascular:     Rate and Rhythm: Normal rate and regular rhythm.  Pulmonary:     Effort: Pulmonary effort is normal.     Breath sounds: Examination of the left-lower field reveals decreased breath sounds. Decreased breath sounds present. No wheezing, rhonchi or rales.  Musculoskeletal:        General: Normal range of motion.     Cervical back: Normal range of motion.     Right lower leg: No  edema.     Left lower leg: No edema.  Neurological:     General: No focal deficit present.     Mental Status: He is alert and oriented to person, place, and time. Mental status is at baseline.  Psychiatric:        Mood and Affect: Mood normal.        Behavior: Behavior normal.        Thought Content: Thought content normal.        Judgment: Judgment normal.          Results   Assessment & Plan:   Assessment and Plan Assessment & Plan Acute bacterial conjunctivitis, right eye predominant Acute bacterial conjunctivitis with yellow discharge and crusting, predominantly affecting the right eye. Symptoms include blurry vision and gritty sensation, likely due to the bacterial infection. Differential diagnosis includes viral conjunctivitis, but the presence of yellow discharge suggests bacterial etiology. - Prescribed antibiotic ointment for the eyes. - Advised warm washcloth cleaning from inner to outer canthus to prevent reinfection. - Instructed to wash and change pillowcases to prevent reinfection. - Advised to monitor for sudden vision loss or severe changes in vision and seek urgent care if these occur.  Acute productive cough and acute sinusitis Acute productive cough with dark brown sputum and sinusitis symptoms, including headache and facial swelling. No fever or chest pain reported. Differential diagnosis includes bacterial sinusitis and possible pneumonia. Symptoms have persisted for four days, with potential for worsening if viral. Augmentin   chosen for its coverage of both sinus and potential chest infections. - Prescribed Augmentin , one pill twice a day for ten days, with food to prevent stomach irritation. - Prescribed Tessalon  Perles for cough management, to be taken during the day. - Advised to monitor symptoms and contact the clinic if not improved in 48 hours or if fever, worsening symptoms, or breathing difficulties develop. - Continue antihistamine for allergy management.        Return if symptoms worsen or fail to improve.     Ginger Patrick, MSN, APRN, FNP-C [facility]

## 2024-05-09 ENCOUNTER — Encounter: Payer: Self-pay | Admitting: Family Medicine

## 2024-05-09 ENCOUNTER — Ambulatory Visit: Admitting: Family Medicine

## 2024-05-09 VITALS — BP 134/78 | HR 88 | Temp 97.6°F | Ht 65.5 in | Wt 210.2 lb

## 2024-05-09 DIAGNOSIS — R7303 Prediabetes: Secondary | ICD-10-CM | POA: Diagnosis not present

## 2024-05-09 DIAGNOSIS — E66811 Obesity, class 1: Secondary | ICD-10-CM

## 2024-05-09 DIAGNOSIS — I10 Essential (primary) hypertension: Secondary | ICD-10-CM

## 2024-05-09 LAB — POCT GLYCOSYLATED HEMOGLOBIN (HGB A1C): Hemoglobin A1C: 5.7 % — AB (ref 4.0–5.6)

## 2024-05-09 NOTE — Progress Notes (Signed)
 Ph: [phone] Fax: [phone]   Patient ID: [name], male    DOB: [birth year], 78 y.o.   MRN: [id number]  This visit was conducted in person.  BP 134/78   Pulse 88   Temp 97.6 F (36.4 C) (Oral)   Ht 5' 5.5 (1.664 m)   Wt 210 lb 3.2 oz (95.3 kg)   SpO2 96%   BMI 34.45 kg/m    CC: 6 mo f/u visit  Subjective:   HPI: [name] is a 78 y.o. male presenting on 05/09/2024 for Medical Management of Chronic Issues (Frequent urination only with tcoh? No other concerns,/Tries to limit carbs/ sugar)   [facility]  HTN - Compliant with current antihypertensive regimen of doxazosin  2mg  nightly, lasix  10mg  daily, losartan  50mg  daily. Does check blood pressures at home: 125-140 systolic. No low blood pressure readings or symptoms of dizziness/syncope. Denies HA, vision changes, CP/tightness, SOB, leg swelling.    11 lb weight loss - has been working out at [employer], taking senior classes, continues golfing regularly.   Recent trip to [facility] 1+ wk ago - episode where he had sudden urgency a few times on the drive back. He had drank more alcohol than normal. Has not had any difficulty since back, no dysuria, urgency, frequency.   Chronic R achilles tendinopathy - markedly improved sine he started using horse lineament ointment. [facility]. Continues heel lift inserts.   Lab Results  Component Value Date   HGBA1C 5.7 (A) 05/09/2024        Relevant past medical, surgical, family and social history reviewed and updated as indicated. Interim medical history since our last visit reviewed. Allergies and medications reviewed and updated. Outpatient Medications Prior to Visit  Medication Sig Dispense Refill   acetaminophen  (TYLENOL ) 500 MG tablet Take 1,000 mg by mouth daily as needed for headache.      albuterol  (VENTOLIN  HFA) 108 (90 Base) MCG/ACT inhaler INHALE TWO PUFFS INTO THE LUNGS EVERY 6 HOURS  AS NEEDED 18 each 3   allopurinol  (ZYLOPRIM ) 100 MG tablet Take 1 tablet (100 mg total) by mouth daily. Daily for gout prevention 90 tablet 4   cholecalciferol (VITAMIN D3) 25 MCG (1000 UNIT) tablet Take 1,000 Units by mouth daily.     co-enzyme Q-10 50 MG capsule Take 1 capsule (50 mg total) by mouth daily.     colchicine  0.6 MG tablet TAKE 1 TABLET BY MOUTH DAILY AS NEEDED (GOUT FLARE). FIRST DOSE MAY TAKE 2 TABLETS (1.2MG ) 30 tablet 1   doxazosin  (CARDURA ) 2 MG tablet Take 1 tablet (2 mg total) by mouth at bedtime. 90 tablet 3   fluticasone  (FLONASE ) 50 MCG/ACT nasal spray USE 2 SPRAYS INTO EACH NOSTRIL ONCE DAILY AS NEEDED FOR CONGESTION 16 mL 9   furosemide  (LASIX ) 20 MG tablet Take 0.5 tablets (10 mg total) by mouth daily. 45 tablet 3   guaiFENesin -codeine  (ROBITUSSIN AC) 100-10 MG/5ML syrup Take 5 mLs by mouth 3 (three) times daily as needed for cough. 120 mL 0   losartan  (COZAAR ) 50 MG tablet Take 1 tablet (50 mg total) by mouth daily. 90 tablet 4   lovastatin  (MEVACOR ) 20 MG tablet Take 1 tablet (20 mg total) by mouth at bedtime. Daily for cholesterol 90 tablet 4   Misc Natural Products (OSTEO BI-FLEX ADV TRIPLE ST PO) Take by mouth.     pantoprazole  (PROTONIX ) 40 MG tablet Take 1 tablet (  40 mg total) by mouth daily. Daily for reflux 90 tablet 4   QUERCETIN PO Take by mouth.     scopolamine  (TRANSDERM-SCOP) 1 MG/3DAYS PLACE 1 PATCH ONTO THE SKIN EVERY 3 DAYS. (Patient taking differently: As needed for travel) 10 patch 0   sertraline (ZOLOFT) 100 MG tablet TAKE ONE-HALF TABLET BY MOUTH TWICE A DAY FOR PTSD     sildenafil  (REVATIO ) 20 MG tablet TAKE 2-5 TABLETS DAILY AS NEEDED 45 tablet 4   valACYclovir  (VALTREX ) 1000 MG tablet Take 2 tablets (2,000 mg total) by mouth 2 (two) times daily. For one day per flare 20 tablet 1   Zinc 50 MG TABS Take by mouth.     amoxicillin -clavulanate (AUGMENTIN ) 875-125 MG tablet Take 1 tablet by mouth 2 (two) times daily. 20 tablet 0   benzonatate   (TESSALON ) 200 MG capsule Take 1 capsule (200 mg total) by mouth 2 (two) times daily as needed for cough. 20 capsule 0   No facility-administered medications prior to visit.     Per HPI unless specifically indicated in ROS section below Review of Systems  Objective:  BP 134/78   Pulse 88   Temp 97.6 F (36.4 C) (Oral)   Ht 5' 5.5 (1.664 m)   Wt 210 lb 3.2 oz (95.3 kg)   SpO2 96%   BMI 34.45 kg/m   Wt Readings from Last 3 Encounters:  05/09/24 210 lb 3.2 oz (95.3 kg)  04/12/24 213 lb (96.6 kg)  12/02/23 213 lb 6.4 oz (96.8 kg)      Physical Exam Vitals and nursing note reviewed.  Constitutional:      Appearance: Normal appearance. He is not ill-appearing.  HENT:     Head: Normocephalic and atraumatic.     Mouth/Throat:     Mouth: Mucous membranes are moist.     Pharynx: Oropharynx is clear. No oropharyngeal exudate or posterior oropharyngeal erythema.  Eyes:     Extraocular Movements: Extraocular movements intact.     Pupils: Pupils are equal, round, and reactive to light.  Neck:     Thyroid : No thyroid  mass or thyromegaly.  Cardiovascular:     Rate and Rhythm: Normal rate and regular rhythm.     Pulses: Normal pulses.     Heart sounds: Normal heart sounds. No murmur heard. Pulmonary:     Effort: Pulmonary effort is normal. No respiratory distress.     Breath sounds: Normal breath sounds. No wheezing, rhonchi or rales.  Musculoskeletal:     Cervical back: Normal range of motion and neck supple. No rigidity.     Right lower leg: Edema (tr) present.     Left lower leg: Edema (tr) present.  Lymphadenopathy:     Cervical: No cervical adenopathy.  Skin:    General: Skin is warm and dry.     Findings: No rash.  Neurological:     Mental Status: He is alert.  Psychiatric:        Mood and Affect: Mood normal.        Behavior: Behavior normal.       Results for orders placed or performed in visit on 05/09/24  HgB A1c   Collection Time: 05/09/24 11:10 AM  Result  Value Ref Range   Hemoglobin A1C 5.7 (A) 4.0 - 5.6 %   HbA1c POC (<> result, manual entry)     HbA1c, POC (prediabetic range)     HbA1c, POC (controlled diabetic range)      Assessment & Plan:   Problem  List Items Addressed This Visit     Prediabetes   Remains under excellent control - congratulated      Relevant Orders   HgB A1c (Completed)   Obesity, Class I, BMI 30-34.9   11 lb weight loss since physical, congratulated!  - he has increased exercise routine. He is motivated to maintain weight loss efforts.       Hypertension - Primary   Chronic, well controlled - continue current regimen.  Weight loss has helped BP control.         No orders of the defined types were placed in this encounter.   Orders Placed This Encounter  Procedures   HgB A1c    Patient Instructions  You are doing well today!  Continue current medicines.  Return after April 9th 2026 for physical.   Follow up plan: Return in about 4 months (around 09/07/2024) for annual exam, prior fasting for blood work.  Anton Blas, MD

## 2024-05-09 NOTE — Assessment & Plan Note (Addendum)
 Chronic, well controlled - continue current regimen.  Weight loss has helped BP control.

## 2024-05-09 NOTE — Assessment & Plan Note (Signed)
 Remains under excellent control - congratulated

## 2024-05-09 NOTE — Assessment & Plan Note (Signed)
 11 lb weight loss since physical, congratulated!  - he has increased exercise routine. He is motivated to maintain weight loss efforts.

## 2024-05-09 NOTE — Patient Instructions (Addendum)
 You are doing well today!  Continue current medicines.  Return after April 9th 2026 for physical.

## 2024-05-13 NOTE — Progress Notes (Signed)
 [name]                                          MRN: [id number]   05/13/2024   The [facility] Quality Team Specialist reviewed this patient medical record for the purposes of chart review for care gap closure. The following were reviewed: abstraction for care gap closure-controlling blood pressure.    [facility] Quality Team

## 2024-05-24 ENCOUNTER — Ambulatory Visit: Payer: Self-pay

## 2024-05-24 NOTE — Telephone Encounter (Signed)
 FYI Only or Action Required?: FYI only for provider: appointment scheduled on 12/31.  Patient was last seen in primary care on 05/09/2024 by Rilla Baller, MD.  Called Nurse Triage reporting Cough.  Symptoms began yesterday.  Interventions attempted: OTC medications: mucinex  DM and Rest, hydration, or home remedies.  Symptoms are: gradually worsening.  Triage Disposition: See HCP Within 4 Hours (Or PCP Triage)  Patient/caregiver understands and will follow disposition?: Yes     Copied from CRM #[id number]. Topic: Clinical - Red Word Triage >> May 24, 2024  9:13 AM Larissa RAMAN wrote: [facility] that prompted transfer to Nurse Triage: cough with mucous- worsening Reason for Disposition  [1] Fever > 100 F (37.8 C) AND [2] diabetes mellitus or weak immune system (e.g., HIV positive, cancer chemo, splenectomy, organ transplant, chronic steroids)  Answer Assessment - Initial Assessment Questions This RN recommended pt be examined in next 4 hours, no availability with offices, advised UC or mobile bus in meantime, pt wife preferring the first available appt, scheduled for tomorrow am with PCP office. Advised keep hydrated and call back if any worsening or new symptoms. Pt wife verbalized understanding.  Speaking with pt wife Started last night Coughed during the night, sounds kind of croupy this morning Took mucinex  DM sleeping right now Don't let it get out of hand hx bronchitis and pneumonia Not sure color of mucus Doesn't sound that bad this morning, just the cough, no SOB No chest pain Feels warm probably got a low grade fever Chills last night Preferring appt tomorrow rather than UC or mobile bus  Protocols used: Cough - Acute Productive-A-AH

## 2024-05-24 NOTE — Telephone Encounter (Signed)
 Appreciate [name] seeing pt tomrrow.

## 2024-05-25 ENCOUNTER — Ambulatory Visit: Admitting: Family

## 2024-05-25 ENCOUNTER — Encounter: Payer: Self-pay | Admitting: Family

## 2024-05-25 ENCOUNTER — Ambulatory Visit
Admission: RE | Admit: 2024-05-25 | Discharge: 2024-05-25 | Disposition: A | Discharge status: Home / Self Care | Source: Ambulatory Visit | Attending: Family | Admitting: Family

## 2024-05-25 VITALS — BP 122/70 | HR 78 | Temp 98.6°F | Ht 67.0 in | Wt 211.0 lb

## 2024-05-25 DIAGNOSIS — R051 Acute cough: Secondary | ICD-10-CM | POA: Diagnosis not present

## 2024-05-25 DIAGNOSIS — J209 Acute bronchitis, unspecified: Secondary | ICD-10-CM

## 2024-05-25 MED ORDER — IPRATROPIUM-ALBUTEROL 0.5-2.5 (3) MG/3ML IN SOLN
3.0000 mL | Freq: Once | RESPIRATORY_TRACT | Status: AC
  Administered 2024-05-25: 3 mL via RESPIRATORY_TRACT

## 2024-05-25 MED ORDER — PREDNISONE 20 MG PO TABS: 40.0000 mg | ORAL_TABLET | Freq: Every day | ORAL | 0 refills | Status: AC

## 2024-05-25 MED ORDER — DOXYCYCLINE HYCLATE 100 MG PO TABS: 100.0000 mg | ORAL_TABLET | Freq: Two times a day (BID) | ORAL | 0 refills | Status: AC

## 2024-05-25 NOTE — Progress Notes (Signed)
 "  Acute Office Visit  Subjective:     Patient ID: [name], male    DOB: 1946/01/07, 78 y.o.   MRN: [id number]  Chief Complaint  Patient presents with   Cough    HPI Patient is in today with complaints of cough, congestion, mild shortness of breath x 3 days and worsening.  Been taking over-the-counter Mucinex  DM that has helped some.  Admits to wheezing.  Denies any fever or chills.  Denies any known sick contacts but did have company over for Christmas.  He was vaccinated against influenza.  Patient admits that about 6 weeks ago the similar thing happened and he was treated with antibiotics and recovered.  Review of Systems  Constitutional:  Negative for chills and fever.  HENT:  Positive for congestion.   Respiratory:  Positive for cough, shortness of breath and wheezing. Negative for sputum production.   Cardiovascular: Negative.   Gastrointestinal: Negative.   Musculoskeletal: Negative.   Skin: Negative.   Neurological: Negative.   Endo/Heme/Allergies: Negative.   Psychiatric/Behavioral: Negative.      Past Medical History:  Diagnosis Date   AR (allergic rhinitis)    Arthritis    PAIN AND OA LEFT KNEE ;  S/P RIGHT TOTAL KNEE REPLACEMENT   Complication of anesthesia    STATES SEVERE PAIN WAKING UP AFTER GENERAL ANESTHESIA; AND FEELS BAD.   Dislocation of finger, interphalangeal joint, left, closed 2022   left ring finger   ED (erectile dysfunction)    GERD (gastroesophageal reflux disease)    Gout    per pt   History of kidney stones    History of nephrolithiasis    seen Dr. [facility], last 02/2011   Hypercholesterolemia    RBBB (right bundle branch block)    Squamous cell skin cancer 07/2019   L clavicle Everlina So)    Social History   Socioeconomic History   Marital status: Married    Spouse name: Not on file   Number of children: Not on file   Years of education: Not on file   Highest education level: Not on file  Occupational  History   Not on file  Tobacco Use   Smoking status: Never   Smokeless tobacco: Never  Vaping Use   Vaping status: Never Used  Substance and Sexual Activity   Alcohol use: Yes    Comment: very seldom, 1 yearly to celebrate   Drug use: No   Sexual activity: Yes  Other Topics Concern   Not on file  Social History Narrative   Caffeine: occasionally, 2 diet cokes/day   [place] veteran   Married, lives with wife   Retired: nutritional therapist   Activity: Walks and runs 3 miles a day.  at [employer] 5d/wk. Stays active golfing.    Diet: good water, fruits/vegetables daily, avoiding carbs and watching portion sizes   Social Drivers of Health   Tobacco Use: Low Risk (05/25/2024)   Patient History    Smoking Tobacco Use: Never    Smokeless Tobacco Use: Never    Passive Exposure: Not on file  Financial Resource Strain: Low Risk (08/10/2023)   Overall Financial Resource Strain (CARDIA)    Difficulty of Paying Living Expenses: Not hard at all  Food Insecurity: No Food Insecurity (08/10/2023)   Hunger Vital Sign    Worried About Running Out of Food in the Last Year: Never true    Ran Out of Food in the Last Year: Never true  Transportation Needs: No Transportation  Needs (08/10/2023)   PRAPARE - Administrator, Civil Service (Medical): No    Lack of Transportation (Non-Medical): No  Physical Activity: Sufficiently Active (08/10/2023)   Exercise Vital Sign    Days of Exercise per Week: 5 days    Minutes of Exercise per Session: 120 min  Stress: No Stress Concern Present (08/10/2023)   [employer] of Occupational Health - Occupational Stress Questionnaire    Feeling of Stress : Only a little  Recent Concern: Stress - Stress Concern Present (08/10/2023)   [employer] of Occupational Health - Occupational Stress Questionnaire    Feeling of Stress : To some extent  Social Connections: Socially Integrated (08/10/2023)   Social Connection and Isolation Panel    Frequency of Communication  with Friends and Family: More than three times a week    Frequency of Social Gatherings with Friends and Family: More than three times a week    Attends Religious Services: More than 4 times per year    Active Member of Clubs or Organizations: Yes    Attends Banker Meetings: 1 to 4 times per year    Marital Status: Married  Catering Manager Violence: Not At Risk (08/10/2023)   Humiliation, Afraid, Rape, and Kick questionnaire    Fear of Current or Ex-Partner: No    Emotionally Abused: No    Physically Abused: No    Sexually Abused: No  Depression (PHQ2-9): Low Risk (05/25/2024)   Depression (PHQ2-9)    PHQ-2 Score: 0  Alcohol Screen: Low Risk (08/10/2023)   Alcohol Screen    Last Alcohol Screening Score (AUDIT): 0  Housing: Unknown (08/10/2023)   Housing Stability Vital Sign    Unable to Pay for Housing in the Last Year: No    Number of Times Moved in the Last Year: Not on file    Homeless in the Last Year: No  Utilities: Not At Risk (08/10/2023)   [facility] Utilities    Threatened with loss of utilities: No  Health Literacy: Adequate Health Literacy (08/10/2023)   B1300 Health Literacy    Frequency of need for help with medical instructions: Never    Past Surgical History:  Procedure Laterality Date   2D echo  10/11   mild diastolic dysfunction, nl EF    COLONOSCOPY  05/2006   normal, rpt 10 yrs [facility], [facility])   KNEE ARTHROSCOPY Right 2000   L thumb joint replace Left 2005   GRAMIG    TONSILLECTOMY AND ADENOIDECTOMY  1954   TOTAL HIP ARTHROPLASTY Right 08/13/2016   Procedure: RIGHT TOTAL HIP ARTHROPLASTY ANTERIOR APPROACH;  Surgeon: Dempsey Moan, MD;  Location: [facility];  Service: Orthopedics;  Laterality: Right;   TOTAL KNEE ARTHROPLASTY Right 12/2010   TOTAL KNEE ARTHROPLASTY Left 05/08/2014   Procedure: LEFT TOTAL KNEE ARTHROPLASTY;  Surgeon: Dempsey Moan GAILS, MD;  Location: [facility];  Service: Orthopedics;  Laterality: Left;    Family History  Problem Relation Age of  Onset   Cancer Father        lung and brain (smoker)   Stroke Paternal Grandmother 57       stroke or heart attack (unsure)   Cancer Mother        lung (smoker)   Cancer Maternal Grandmother        leukemia   CAD Neg Hx        unsure (see above)   Diabetes Neg Hx     Allergies[1]  Medications Ordered Prior to Encounter[2]  BP 122/70 (BP Location: Left Arm, Patient Position: Sitting, Cuff Size: Normal)   Pulse 78   Temp 98.6 F (37 C) (Oral)   Ht 5' 7 (1.702 m)   Wt 211 lb (95.7 kg)   SpO2 92%   BMI 33.05 kg/m chart     Objective:    BP 122/70 (BP Location: Left Arm, Patient Position: Sitting, Cuff Size: Normal)   Pulse 78   Temp 98.6 F (37 C) (Oral)   Ht 5' 7 (1.702 m)   Wt 211 lb (95.7 kg)   SpO2 92%   BMI 33.05 kg/m    Physical Exam Vitals and nursing note reviewed.  Constitutional:      Appearance: Normal appearance. He is normal weight.  HENT:     Right Ear: Tympanic membrane, ear canal and external ear normal.     Left Ear: Tympanic membrane, ear canal and external ear normal.     Mouth/Throat:     Mouth: Mucous membranes are dry.  Cardiovascular:     Rate and Rhythm: Normal rate and regular rhythm.  Pulmonary:     Effort: Pulmonary effort is normal.     Comments: Diminished breath sounds bilaterally.  DuoNeb treatment administered. Musculoskeletal:        General: Normal range of motion.     Cervical back: Normal range of motion and neck supple.  Skin:    General: Skin is warm and dry.  Neurological:     General: No focal deficit present.     Mental Status: He is alert and oriented to person, place, and time. Mental status is at baseline.  Psychiatric:        Mood and Affect: Mood normal.        Behavior: Behavior normal.        Thought Content: Thought content normal.        Judgment: Judgment normal.     No results found for any visits on 05/25/24.      Assessment & Plan:   Problem List Items Addressed This Visit   None Visit  Diagnoses       Acute bronchitis, unspecified organism    -  Primary   Relevant Medications   ipratropium-albuterol  (DUONEB) 0.5-2.5 (3) MG/3ML nebulizer solution 3 mL (Start on 05/25/2024  8:45 AM)   Other Relevant Orders   DG Chest 2 View     Acute cough       Relevant Medications   ipratropium-albuterol  (DUONEB) 0.5-2.5 (3) MG/3ML nebulizer solution 3 mL (Start on 05/25/2024  8:45 AM)   Other Relevant Orders   DG Chest 2 View       Meds ordered this encounter  Medications   predniSONE (DELTASONE) 20 MG tablet    Sig: Take 2 tablets (40 mg total) by mouth daily with breakfast.    Dispense:  10 tablet    Refill:  0   doxycycline (VIBRA-TABS) 100 MG tablet    Sig: Take 1 tablet (100 mg total) by mouth 2 (two) times daily.    Dispense:  20 tablet    Refill:  0   ipratropium-albuterol  (DUONEB) 0.5-2.5 (3) MG/3ML nebulizer solution 3 mL   Call the office if symptoms worsen or persist.  Recheck as scheduled and sooner as needed. No follow-ups on file.  Jonell Krontz B Ander Wamser, FNP      [1]  Allergies Allergen Reactions   Lisinopril Cough  [2]  Current Outpatient Medications on File Prior to Visit  Medication Sig Dispense Refill  acetaminophen  (TYLENOL ) 500 MG tablet Take 1,000 mg by mouth daily as needed for headache.      albuterol  (VENTOLIN  HFA) 108 (90 Base) MCG/ACT inhaler INHALE TWO PUFFS INTO THE LUNGS EVERY 6 HOURS AS NEEDED 18 each 3   allopurinol  (ZYLOPRIM ) 100 MG tablet Take 1 tablet (100 mg total) by mouth daily. Daily for gout prevention 90 tablet 4   cholecalciferol (VITAMIN D3) 25 MCG (1000 UNIT) tablet Take 1,000 Units by mouth daily.     co-enzyme Q-10 50 MG capsule Take 1 capsule (50 mg total) by mouth daily.     colchicine  0.6 MG tablet TAKE 1 TABLET BY MOUTH DAILY AS NEEDED (GOUT FLARE). FIRST DOSE MAY TAKE 2 TABLETS (1.2MG ) 30 tablet 1   doxazosin  (CARDURA ) 2 MG tablet Take 1 tablet (2 mg total) by mouth at bedtime. 90 tablet 3   fluticasone  (FLONASE ) 50  MCG/ACT nasal spray USE 2 SPRAYS INTO EACH NOSTRIL ONCE DAILY AS NEEDED FOR CONGESTION 16 mL 9   furosemide  (LASIX ) 20 MG tablet Take 0.5 tablets (10 mg total) by mouth daily. 45 tablet 3   guaiFENesin -codeine  (ROBITUSSIN AC) 100-10 MG/5ML syrup Take 5 mLs by mouth 3 (three) times daily as needed for cough. 120 mL 0   losartan  (COZAAR ) 50 MG tablet Take 1 tablet (50 mg total) by mouth daily. 90 tablet 4   lovastatin  (MEVACOR ) 20 MG tablet Take 1 tablet (20 mg total) by mouth at bedtime. Daily for cholesterol 90 tablet 4   Misc Natural Products (OSTEO BI-FLEX ADV TRIPLE ST PO) Take by mouth.     pantoprazole  (PROTONIX ) 40 MG tablet Take 1 tablet (40 mg total) by mouth daily. Daily for reflux 90 tablet 4   QUERCETIN PO Take by mouth.     scopolamine  (TRANSDERM-SCOP) 1 MG/3DAYS PLACE 1 PATCH ONTO THE SKIN EVERY 3 DAYS. (Patient taking differently: As needed for travel) 10 patch 0   sertraline (ZOLOFT) 100 MG tablet TAKE ONE-HALF TABLET BY MOUTH TWICE A DAY FOR PTSD     sildenafil  (REVATIO ) 20 MG tablet TAKE 2-5 TABLETS DAILY AS NEEDED 45 tablet 4   valACYclovir  (VALTREX ) 1000 MG tablet Take 2 tablets (2,000 mg total) by mouth 2 (two) times daily. For one day per flare 20 tablet 1   Zinc 50 MG TABS Take by mouth.     No current facility-administered medications on file prior to visit.   "

## 2024-06-05 ENCOUNTER — Ambulatory Visit: Payer: Self-pay | Admitting: Family

## 2024-08-10 ENCOUNTER — Ambulatory Visit

## 2024-08-30 ENCOUNTER — Other Ambulatory Visit

## 2024-09-06 ENCOUNTER — Encounter: Admitting: Family Medicine
